# Patient Record
Sex: Female | Born: 1950 | Race: White | Hispanic: No | Marital: Married | Smoking: Never smoker
Health system: Northeastern US, Academic
[De-identification: ages and names within clinical notes are randomized; demographics above are authoritative.]

---

## 2006-06-19 ENCOUNTER — Ambulatory Visit

## 2016-01-17 ENCOUNTER — Ambulatory Visit (HOSPITAL_BASED_OUTPATIENT_CLINIC_OR_DEPARTMENT_OTHER): Admitting: Family Medicine

## 2016-01-17 NOTE — Progress Notes (Signed)
* * *        **[name], [name]**    ------    65 Y old Female, DOB: 09/14/1951    Account Number: [id number]    [address], [place], [place]    Home: [phone]    Guarantor: [name] Insurance: [employer] [facility] [employer] OF  (MEDICAID HMO) Payer  ID: [id number]    PCP: [name] External Visit ID: [id number]    Appointment Facility: [facility]        * * *    01/17/2016    Progress Notes: [name], M.D    ------    ---        **Current Medications**    ---    Taking      * metoprolol 100 mg tablet 1 tab(s) orally 2 times a day     ---    * glyburide 5 mg tablet 1 tab(s) orally once a day     ---    * gabapentin 300 mg capsule 1 cap(s) orally 2 times a day     ---    * atorvastatin 40 mg tablet 1 tab(s) orally once a day (at bedtime)     ---    * Freestyle Freedom Lite Lancets as directed 2x a day     ---    * Freestyle Freedom Lite Test Strips as directed 2x a day     ---    * multivitamin Multiple Vitamins capsule 1 cap(s) orally once a day     ---    * Prilosec OTC 20 mg delayed release tablet 1 tab(s) orally once a day     ---    * metformin 1000 mg tablet 1 tab(s) orally 2 times a day     ---    * aspirin 81 mg delayed release tablet 1 tab(s) orally once a day     ---    * lisinopril 10 mg tablet 1 tab(s) orally once a day     ---    * Medication List reviewed and reconciled with the patient     ---      **Past Medical History**    ---      HTN    ---    high cholesterol-TC-162 HDL-50 LDL-76 TG-182 done 3/87/56    ---    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- [name].    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 2daughter(s) -  healthy.    ---      **Social History**    ---    Smoking  Status: never smoker  , Patient counseled on the dangers of tobacco  use: 01/17/2016.    Seat belt: yes.    no Alcohol.    no Drug use.    Exercise: yes.    Marital Status: married, husband - [name].    Number of household members: 4, husband, daughter, grandson.    Occupation: retired.    no Travel outside [place].    Caffeine: coffee in the am.    Pets: 1 cat, 3 dogs (chihuahuas).    Sexual orientation: heterosexual.    Sexually active: not currently.      **Gyn History**    ---    Periods : no spotting since menopause.    Date of Last Period unsure, somewhere around age 65-64.    Menarche 13.      **OB History**    ---    Total pregnancies 7\.    Total living children 2\.    Miscarriage(s) 5\.      **Allergies**    ---      N.K.D.A.    ---        **Reason for Appointment**    ---      1\. Follow up BP and DM    ---      **History of Present Illness**    ---    _Hypertension_ :    65 year old female presents with c/o HYPERTENSION F/U. c/o MED COMPLIANCE.    Denies : HOME BP CHECKS. MED SIDE EFFECTS. CHEST PAIN. DIZZINESS. LEG EDEMA.  ORTHOPNEA. PAROXYSMAL NOCTURNAL DYSPNEA. PALPITATIONS.    _Diabetes_ :    c/o DIABETES F/U. c/o HOME GLUCOSE MONITORING  130-150s in the AM  . c/o  COMPLIANCE    trying to maintain her diet    .    Denies : POLYURIA. POLYDYPSIA. POLYPHAGIA. WEIGHT GAIN. WEIGHT LOSS. FATIGUE.  PARASTHESIAS.      **Vital Signs**    ---    HR 71, BP  144/72  , Repeat BP 148/78, O2 Sat. 98, Ht 58, Wt 166, Wt Change -2  lb, BMI  34.69  .      **Examination**     ---    _General Examination_ :    General Appearance:  well developed and well-nourished  ,  alert and oriented  .    HEENT:  clear conjunctiva  , no erythema or injection, inner lids pale,  TM's  clear and flat bilaterally  ,  nose clear  , o  ropharynx clear with MMM  ,  pharynx and tonsils normal  .    Neck, Thyroid :  supple, no lymphadenopathy  ,  no thyromegaly  .    Heart:  RRR, normal S1S2  ,  no murmurs  .    Lungs:  clear to auscultation  ,  regular breathing rate and effort  .    Extremities  normal sensation to monofilament testing bilat  .    Skin:  normal, no rash  .    Neurologic Exam:  non-focal exam  .    _Diabetic foot exam_ :    Monofilament test  Normal  .    Pedal Pulses  Normal  .    Skin Integrity  Normal  .    Vibration Sense  Normal  .    Overall  Evaluation Normal  .          **  Assessments**    ---    1\. 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 Hypertension - I10    ---      **Treatment**    ---      **1\. Type 2 diabetes mellitus with diabetic neuropathy, unspecified**    Continue metformin tablet, 1000 mg, 1 tab(s), orally, 2 times a day    Continue glyburide tablet, 5 mg, 1 tab(s), orally, once a day    _LAB: Hemoglobin A1C_ 7.3%    A1C    7.3%        ------------        Notes: HgA1c goal is <7, her HgA1c today was 7.3, which is a nice improvement.  I congratulated her on her progress, work on [employer], and have her return in 3  months.        **2\. Hypertension**    Continue metoprolol tablet, 100 mg, 1 tab(s), orally, 2 times a day    Continue lisinopril tablet, 10 mg, 1 tab(s), orally, once a day    Notes: BP not at goal, but she doesn't want to increase it at this point. She  would consider making a change in the future though if it remains elevated.  Reinforced TLC for the change.      **Procedure Codes**    ---      [id number] BLOOD DRAWING    ---    [address] GLYCATED HEMOGLOBIN TEST    ---      **Follow Up**    ---    3  Months (Reason: f.u with [name]. Dot [name] for DM and BP)    Electronically signed by [name] , MD on 01/17/2016 at 10:46 PM EDT    Sign off status: Completed        * * [facility]    [address]    [place], [place] [address]    Tel: [phone]    Fax: [phone]              * * *          Patient: [name], [name] DOB: 05-11-1951 Progress Note: [name], M.D  01/17/2016    ---    Note generated by eClinicalWorks EMR/PM Software ([email or web address])

## 2016-01-27 ENCOUNTER — Ambulatory Visit

## 2016-01-27 ENCOUNTER — Ambulatory Visit (HOSPITAL_BASED_OUTPATIENT_CLINIC_OR_DEPARTMENT_OTHER): Admitting: Family Medicine

## 2016-01-27 NOTE — Progress Notes (Signed)
* * *        **  [name], [name]**    ------    71 Y old Female, DOB: 06-19-1951    [address], [place], [place] [address]    Home: [phone]    Provider: Alfonso Ramus        * * *    Telephone Encounter    ---    Answered by    Sharyn Dross    Date: 01/27/2016        Time: 02:12 PM    Caller    [name]    ------            Reason    Refills lisinopril            Message                      lisinipril 10 mg            [phone]                Action Taken    [name],[name] 01/27/2016 2:13:12 PM > Last PE 10/14/15.  Appt for f/u scheduled 04/17/16. Lisinopril restarted by FO on 01/17/16.  Anelly Samarin B 01/27/2016 4:57:21 PM > Rx esent            Refills    Refill lisinopril tablet, 10 mg, orally, 90 Tablet, 1 tab(s), once  a day, 90 days, Refills=0    ------          * * *                ---          * * *          Patient: [name], [name] DOB: May 25, 1951 Provider: Peggye Form B  01/27/2016    ---    Note generated by eClinicalWorks EMR/PM Software ([email or web address])

## 2016-01-31 ENCOUNTER — Ambulatory Visit (HOSPITAL_BASED_OUTPATIENT_CLINIC_OR_DEPARTMENT_OTHER): Admitting: Family Medicine

## 2016-01-31 ENCOUNTER — Ambulatory Visit

## 2016-01-31 NOTE — Progress Notes (Signed)
* * *        **  [name], [name]**    ------    21 Y old Female, DOB: 1951/09/19    [address], [place], [place] [address]    Home: [phone]    Provider: Alfonso Ramus        * * *    Telephone Encounter    ---    Answered by    Sharyn Dross    Date: 01/31/2016        Time: 11:07 AM    Caller    [name]- daughter    ------            Reason    Refills metoprolol            Message                      metoprolol 100 mg BID            [phone]                Action Taken    [name],[name] 01/31/2016 11:08:45 AM > Last PE 10/14/15.  Has f/u scheduled 04/17/16. [name] 01/31/2016 11:25:45 PM > Rx esent            Refills    Refill metoprolol tablet, 100 mg, orally, 180, 1 tab(s), 2 times a  day, 90 days, Refills=0    ------          * * *                ---          * * *          Patient: [name], [name] DOB: [birth year] Provider: Peggye Form B  01/31/2016    ---    Note generated by eClinicalWorks EMR/PM Software ([email or web address])

## 2016-01-31 NOTE — Other (Addendum)
[facility] Nurse - [facility] Intake Entered On:  01/31/2016 12:05     Performed On:  01/31/2016 12:01  by Doyne Keel               Vitals/Height/Weight   Peripheral Pulse Rate :   64 bpm   Systolic Blood Pressure :   160 mmHg (HI)    Diastolic Blood Pressure :   76 mmHg   Mean Arterial Pressure :   104 mmHg   Height :   149.86 cm(Converted to: 4 foot 11 in, 59.00 in)    Body Surface Area :   1.7 m2   Weight :   75.1 Kg(Converted to: 165 lb 9 oz)    Body Mass Index :   33.44 Kg/m2   Type of Weight :   Standing   Type of Height :   Stated height   Doyne Keel - 01/31/2016 12:01    [facility] General Information/Subjective   Chief Complaint :   f/u lUNG NODULE    High Risk for Falls :   No   Cancer Fatigue Scale :   3   Cancer Fatigue Score :   3    Preferred Language :   [place]   Name of Interpreter/ID Number :   Patient's daughter is present, she states patient understands most english   Pain Symptoms :   No   Doyne Keel - 01/31/2016 12:01    Patient Preferred Method of Communication   Send Letter   [facility] Infectious Disease Risk Screening   Patient Travel History :   No recent travel   Recent Family Travel History :   No recent travel   Doyne Keel - 01/31/2016 12:01    Allergy   (As Of: 01/31/2016 12:05:38 )   Allergies (Active)   NKA  Estimated Onset Date:   Unspecified ; Created By:   Onnie Boer; Reaction Status:   Active ; Category:   Drug ; Substance:   NKA ; Type:   Allergy ; Updated By:   Roslyn Smiling; Reviewed Date:   01/31/2016 12:03         Medication History   Medication List   (As Of: 01/31/2016 12:05:38 )   Home Meds    lisinopril  :   lisinopril ; Status:   Documented ; Ordered As Mnemonic:   lisinopril 10 mg oral tablet ; Simple Display Line:   10 mg, 1 tab(s), PO, Daily, 0 Refill(s) ; Catalog Code:   lisinopril ; Order Dt/Tm:   01/31/2016 12:04:02          glyBURIDE  :   glyBURIDE ; Status:   Documented ; Ordered As Mnemonic:   glyBURIDE 2.5 mg oral tablet ; Simple Display Line:   2.5 mg, 1  tab(s), PO, Daily, 30 tab(s), 0 Refill(s) ; Catalog Code:   glyBURIDE ; Order Dt/Tm:   08/20/2014 13:34:05          atorvastatin  :   atorvastatin ; Status:   Documented ; Ordered As Mnemonic:   atorvastatin 40 mg oral tablet ; Simple Display Line:   40 mg, 1 tab(s), PO, Daily, 30 tab(s), 0 Refill(s) ; Catalog Code:   atorvastatin ; Order Dt/Tm:   08/20/2014 13:33:49          ibuprofen  :   ibuprofen ; Status:   Documented ; Ordered As Mnemonic:   Advil ; Simple Display Line:   200 mg, PO, q6hr, PRN pain ; Catalog Code:  ibuprofen ; Order Dt/Tm:   12/08/2013 12:59:04          acetaminophen  :   acetaminophen ; Status:   Documented ; Ordered As Mnemonic:   Tylenol ; Simple Display Line:   PO, PRN pain ; Catalog Code:   acetaminophen ; Order Dt/Tm:   12/08/2013 12:58:49          multivitamin  :   multivitamin ; Status:   Documented ; Ordered As Mnemonic:   multivitamin ; Simple Display Line:   1 tab(s), PO, Daily ; Catalog Code:   multivitamin ; Order Dt/Tm:   12/29/2012 09:37:16          omeprazole  :   omeprazole ; Status:   Documented ; Ordered As Mnemonic:   Prilosec OTC 20 mg oral enteric coated tablet ; Simple Display Line:   20 mg, 1 tab(s), PO, Daily ; Catalog Code:   omeprazole ; Order Dt/Tm:   12/29/2012 09:37:29          simvastatin  :   simvastatin ; Status:   Documented ; Ordered As Mnemonic:   Simvastatin ; Simple Display Line:   80 mg, PO, HS ; Catalog Code:   simvastatin ; Order Dt/Tm:   12/29/2012 09:37:03          metformin  :   metformin ; Status:   Documented ; Ordered As Mnemonic:   metformin ; Simple Display Line:   1,000 mg, PO, BID ; Catalog Code:   metFORMIN ; Order Dt/Tm:   02/23/2009 09:39:53          metoprolol  :   metoprolol ; Status:   Documented ; Ordered As Mnemonic:   metoprolol ; Simple Display Line:   100 mg, PO, BID ; Catalog Code:   metoprolol ; Order Dt/Tm:   02/23/2009 09:39:23            [facility] Social History   Cigarette Smoking Last 365 Days :   No   Exposure to Tobacco Smoke :    Never smoker   Tobacco products in past 30 days? :   No   Doyne Keel - 01/31/2016 12:01    Social History   (As Of: 01/31/2016 12:05:38 )   Tobacco:  Denies Tobacco Use       Comments:  01/31/2016 12:04 - Doyne Keel: NEver Smoker   (Last Updated: 01/31/2016 12:04:52  by Doyne Keel)          Alcohol:  Denies Alcohol Use      (Last Updated: 12/29/2012 09:40:27  by Fulton Reek )         Employment/School:        Retired, Work/School description: Housekeeper.   (Last Updated: 12/08/2013 13:00:10  by Lacretia Nicks RN, Frazier Butt)          Exercise:        Exercise duration: 20.  Exercise type: Walking.   (Last Updated: 12/08/2013 13:00:36  by Lacretia Nicks RN, Frazier Butt)          Nutrition/Health:        Type of diet: 3 meals/day.  Regular, Caffeine intake amount: 2 cups/day.   (Last Updated: 12/08/2013 13:01:15  by Lacretia Nicks RN, Frazier Butt)          Substance Abuse:  Denies Substance Abuse      (Last Updated: 12/29/2012 09:40:30  by Fulton Reek )         Home/Environment:        Lives with Children,  Spouse, 2 grandchildren.   (Last Updated: 12/08/2013 13:01:34  by Karl Luke)            Advance Directive   Advanced Directives :   No   Doyne Keel - 01/31/2016 12:01    [facility] ONC Nutrition   Weight Change > 10lbs in 6 Months :   No   Home Diet :   Regular   Appetite :   Good   Feeding Ability :   Complete independence   Doyne Keel - 01/31/2016 12:01    CCA Encounter   Onc Type of Patient :   Outpatient Visit Existing   Onc Visit Level, Existing :   Level 1   (Comment: 5 minutes spent face to face with patient and daughter Doyne Keel - 01/31/2016 12:01 ] )   Doyne Keel - 01/31/2016 12:01

## 2016-01-31 NOTE — Other (Signed)
cc:  Peggye Form M.D.    __________________________________________________________________________    ONCOLOGY CLINIC NOTE  DATE:  01/31/2016    The patient now returns in regards to her lung nodule.  This has previously been  biopsied where it looked consistent with an infectious process.  She now returns  with no new constitutional symptoms.  She is on no new medications.  We reviewed  her chest CT that shows a stable lung nodule with no evidence of progression.  I  still feel as though this likely represents a benign process but we will plan  another CT scan to confirm its stability.  All questions were answered during  our 15-minute visit.    Dictated by:  Alvester Chou, M.D.    D:  01/31/2016 12:45:58  T:  02/01/2016 08:18:20  E:  02/01/2016 08:18:20  CRM/mes  Job# [id number]   SIGNATURE LINE    Electronically signed by Lattie Corns MD, Alcario Drought on 02/07/2016 at 07:33:57 EST

## 2016-02-22 ENCOUNTER — Ambulatory Visit (HOSPITAL_BASED_OUTPATIENT_CLINIC_OR_DEPARTMENT_OTHER): Admitting: Family Medicine

## 2016-02-22 NOTE — Progress Notes (Signed)
* * *        **  [name], [name]**    ------    66 Y old Female, DOB: [birth year]    [address] [place], [place] [address]    Home: [phone]    Provider: Alfonso Ramus        * * *    Telephone Encounter    ---    Answered by    Sharyn Dross    Date: 02/22/2016        Time: 04:18 PM    Caller    [name]- daughter    ------            Reason    Refills gabapentin            Message                      gabapentin 300 mg BID            [phone]                Action Taken    [name],[name] 02/22/2016 4:19:02 PM > Pt had PE  10/14/15. Has appt for follow up 04/17/16. Jeno Calleros B 02/22/2016 4:56:45  PM > Rx esent            Refills    Refill gabapentin capsule, 300 mg, orally, 60, 1 cap(s), 2 times a  day, 30 days, Refills=3    ------          * * *                ---          * * *          Patient: [name], [name] DOB: 25-Nov-1950 Provider: Peggye Form B  02/22/2016    ---    Note generated by eClinicalWorks EMR/PM Software ([email or web address])

## 2016-03-05 ENCOUNTER — Ambulatory Visit (HOSPITAL_BASED_OUTPATIENT_CLINIC_OR_DEPARTMENT_OTHER): Admitting: Family Medicine

## 2016-03-05 NOTE — Progress Notes (Signed)
* * *        **  JAMYIA, FORTUNE**    ------    74 Y old Female, DOB: 08-30-1951    [address] [place], [place] [address]    Home: [phone]    Provider: Lovina Reach        * * *    Telephone Encounter    ---    Answered by    Ivy Lynn    Date: 03/05/2016        Time: 01:10 PM    Caller    dtr. Lisa left VM    ------            Reason    refill            Message                      requesting refill of glyburide 5mg , last A1C 10/14/15 at CPE appt, recent FU appt, no pending appts                Action Taken    Flossie Wexler E 03/05/2016 5:07:30 PM > rx sent            Refills    Refill glyburide tablet, 5 mg, orally, 30, 1 tab(s), once a day,  30 days, Refills=11    ------          * * *                ---          * * *          Patient: Milliman, Hali L DOB: 06-11-1951 Provider: Thelma Barge E  03/05/2016    ---    Note generated by eClinicalWorks EMR/PM Software ([email or web address])

## 2016-03-12 ENCOUNTER — Ambulatory Visit (HOSPITAL_BASED_OUTPATIENT_CLINIC_OR_DEPARTMENT_OTHER): Admitting: Family Medicine

## 2016-03-12 NOTE — Progress Notes (Signed)
* * *        **  [name], [name]**    ------    82 Y old Female, DOB: 03/07/1951    [address] [place], [place] [address]    Home: [phone]    Provider: Lovina Reach        * * *    Telephone Encounter    ---    Answered by    Ivy Lynn    Date: 03/12/2016        Time: 12:40 PM    Caller    dtr. left VM    ------            Reason    refill            Message                      requesting refill of metformin, last A1C 10/14/15, CPE/FU appts current, no pending appts                Action Taken    Maninder Deboer E 03/12/2016 2:53:05 PM > rx sent            Refills    Refill metformin tablet, 1000 mg, orally, 60, 1 tab(s), 2 times a  day, 30 days, Refills=11    ------          * * *                ---          * * *          Patient: [name], [name] DOB: 10-06-1950 Provider: Lovina Reach  03/12/2016    ---    Note generated by eClinicalWorks EMR/PM Software ([email or web address])

## 2016-03-27 ENCOUNTER — Ambulatory Visit (HOSPITAL_BASED_OUTPATIENT_CLINIC_OR_DEPARTMENT_OTHER): Admitting: Family Medicine

## 2016-03-27 ENCOUNTER — Ambulatory Visit: Admitting: Family Medicine

## 2016-03-27 NOTE — Progress Notes (Signed)
* * *        **[name], [name]**    ------    31 Y old Female, DOB: 11-Aug-1951    Account Number: [id number]    [address] [place], [place]    Home: [phone]    Guarantor: Ronney Asters Insurance: [facility] [place] (MEDICAID HMO) Payer  ID: [id number]    External Visit ID: [id number]    Appointment Facility: [facility]        * * *    03/27/2016    Progress Note: [name]    ------    ---        **Current Medications**    ---    Taking      * atorvastatin 40 mg tablet 1 tab(s) orally once a day (at bedtime)     ---    * Freestyle Freedom Lite Lancets as directed 2x a day     ---    * Freestyle Freedom Lite Test Strips as directed 2x a day     ---    * multivitamin Multiple Vitamins capsule 1 cap(s) orally once a day     ---    * Prilosec OTC 20 mg delayed release tablet 1 tab(s) orally once a day     ---    * aspirin 81 mg delayed release tablet 1 tab(s) orally once a day     ---    * lisinopril 10 mg tablet 1 tab(s) orally once a day     ---    * metoprolol 100 mg tablet 1 tab(s) orally 2 times a day     ---    * gabapentin 300 mg capsule 1 cap(s) orally 2 times a day     ---    * glyburide 5 mg tablet 1 tab(s) orally once a day     ---    * metformin 1000 mg tablet 1 tab(s) orally 2 times a day     ---    * Medication List reviewed and reconciled with the patient     ---      **Past Medical History**    ---      HTN    ---    high cholesterol-TC-162 HDL-50 LDL-76 TG-182 done 2/95/28    ---    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- Dr Pearson Grippe.    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 2daughter(s) - healthy.    ---      **  Social History**    ---    Smoking  Status: never smoker  , Patient counseled on the dangers of tobacco  use: 03/27/2016.    Seat belt: yes.    no Alcohol.    no Drug use.    Exercise: yes.    Marital Status: married, husband - Antonio.    Number of household members: 4, husband, daughter, grandson.    Occupation: retired.    no Travel outside [place].    Caffeine: coffee in the am.    Pets: 1 cat, 3 dogs (chihuahuas).    Sexual orientation: heterosexual.    Sexually active: not currently.      **Gyn History**    ---    Periods : no spotting since menopause.    Date of Last Period unsure, somewhere around age [age].    Menarche 13.      **OB History**    ---    Total pregnancies 7\.    Total living children 2\.    Miscarriage(s) 5\.      **Allergies**    ---      N.K.D.A.    ---      **Review of Systems**    ---    _CONSTITUTIONAL_ :    no Fever. no Chills. no Sweats. c/o Fatigue.    _OPTHALMOLOGY_ :    no Change in Vision.    _ENT_ :    Nasal congestion yes. c/o Rhinorrhea. Sore Throat yes. Cough yes. c/o Otalgia.    _RESPIRATORY_ :    no Shortness of Breath. no DOE (dyspnea on exertion).    _CARDIOLOGY_ :    no Chest Pain. no Shortness of Breath. no Palpitations. no Dizziness. no Leg  Edema.    _GASTROENTEROLOGY_ :    no Abdominal Pain. no Nausea. no Vomiting.    _NEUROLOGY_ :    no Headaches.    _DERMATOLOGY_ :    no Rash.            **Reason for Appointment**    ---      1\. Cough, congestion, sore throat    ---      **History of Present Illness**    ---    _Sick Visit_ :    Patient is a 65 year old female presents with cough and  congestion.It began 2  days ago with sorethroat. Yesterday developed cough with scant mucus.  Associated with Post nasal drip Need to clear her throat all the time, fatigue  and body aches. Feels clogged on both ear. No fever and chills. Loss of  appetite. Tried Advil this morning.      **Vital Signs**    ---    HR 60, BP 130/58, Temp 98.4, O2 Sat. 98, Ht 58, Wt 164, Wt Change -2 lb, BMI  34.27  .      **Examination**    ---    _General Examination_ :    General Appearance:  alert and oriented  ,  NAD  .    HEENT:  moist mucous membranes  ,  PERRLA  ,  EOMI  .    Oral cavity:  Mild pharyngeal erythema. No exudates.  .    Lymph nodes  none  .    Heart:  RRR, normal S1S2  ,  no murmurs  .    Lungs:  clear to auscultation bilaterally  ,  no wheezes/rhonchi/rales  .    Extremities  no edema  .    Skin:  no  rash or skin lesions  .          **Assessments**    ---    1\. Upper respiratory infection - J06.9 (Primary)    ---    2\. Pharyngitis - J02.9    ---      **Treatment**    ---      **1\. Upper respiratory infection**    Notes: likely viral cause. Discussed the diagnosis. Self limiting which can  take up to 2 weeks to run its course. Supprotive and symptomatic treatment.  Mucinex DM, Sudafed. Tylenol or Ibuprofen.    ---        **2\. Pharyngitis**    _LAB: Throat Culture_   C Throat    Laboratory, [facility], [address], [place], [place], [address]      \-    ------------        Notes: Likley viral. Supportive care.      **Follow Up**    ---    Return with worsening symptoms.    Electronically signed by Peggye Form on 03/30/2016 at 10:14 PM EDT    Sign off status: Completed        * * [facility]    [address]    [place], [place] [address]    Tel: [phone]    Fax: [phone]              * * *          Patient: [name], [name] DOB: [birth year] Progress Note: [name]  03/27/2016    ---    Note generated by eClinicalWorks EMR/PM Software ([email or web address])

## 2016-03-30 ENCOUNTER — Ambulatory Visit (HOSPITAL_BASED_OUTPATIENT_CLINIC_OR_DEPARTMENT_OTHER): Admitting: Family Medicine

## 2016-03-30 LAB — HX THROAT CULTURE
CASE NUMBER: 2017178002877
HX F: NORMAL
HX P: NORMAL

## 2016-03-30 NOTE — Progress Notes (Signed)
* * *        **  [name], [name]**    ------    48 Y old Female, DOB: 1950-10-05    [address] [place], [place] [address]    Home: [phone]    Provider: Alfonso Ramus        * * *    Telephone Encounter    ---    Answered by    Rubbie Battiest    Date: 03/30/2016        Time: 01:54 PM    Caller    [name] daughter    ------            Reason    TC results            Message                      Her throat is not better, voice is hoarse, trouble swallowing. She is using Mucinex, tylenol, throat spray.       [phone]                Action Taken    Rubbie Battiest 03/30/2016 1:55:18 PM > TC still pending,  normal so far. Discussed symptomatic relief. Lennis Rader B 03/30/2016 2:12:27  PM > Noted and agree. Like Young,[name] 03/30/2016 4:38:12 PM > Daughter  called back, now with redness at back of throat. Discussed it could be viral,  discussed again sx tx. Thought maybe too soon for TC to be done, coming in  tomorrow for re-eval. Cherly Erno B 03/30/2016 7:40:38 PM > Noted                * * *                ---          * * *          Patient: [name], [name] DOB: [birth year] Provider: Peggye Form B  03/30/2016    ---    Note generated by eClinicalWorks EMR/PM Software ([email or web address])

## 2016-03-31 ENCOUNTER — Ambulatory Visit (HOSPITAL_BASED_OUTPATIENT_CLINIC_OR_DEPARTMENT_OTHER): Admitting: Family Medicine

## 2016-03-31 NOTE — Progress Notes (Signed)
* * *        **[name], [name]**    ------    45 Y old Female, DOB: 1950/10/18    Account Number: [id number]    [address], [place], [place]    Home: [phone]    Guarantor: Ronney Asters Insurance: [facility] [place] (MEDICAID HMO) Payer  ID: 0    PCP: Alfonso Ramus External Visit ID: [id number]    Appointment Facility: [facility]        * * *    03/31/2016    Progress Notes: Thelma Barge, M.D    ------    ---        **Current Medications**    ---    Taking      * atorvastatin 40 mg tablet 1 tab(s) orally once a day (at bedtime)     ---    * Freestyle Freedom Lite Lancets as directed 2x a day     ---    * Freestyle Freedom Lite Test Strips as directed 2x a day     ---    * multivitamin Multiple Vitamins capsule 1 cap(s) orally once a day     ---    * Prilosec OTC 20 mg delayed release tablet 1 tab(s) orally once a day     ---    * aspirin 81 mg delayed release tablet 1 tab(s) orally once a day     ---    * lisinopril 10 mg tablet 1 tab(s) orally once a day     ---    * metoprolol 100 mg tablet 1 tab(s) orally 2 times a day     ---    * gabapentin 300 mg capsule 1 cap(s) orally 2 times a day     ---    * glyburide 5 mg tablet 1 tab(s) orally once a day     ---    * metformin 1000 mg tablet 1 tab(s) orally 2 times a day     ---    * Medication List reviewed and reconciled with the patient     ---      **Past Medical History**    ---      HTN    ---    high cholesterol-TC-162 HDL-50 LDL-76 TG-182 done 11/13/06    ---    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- Dr Pearson Grippe.    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    ---    Cardiac stress test-03/01/15-positive EKG changes but neg for inducible wall  motion abnormalities    ---    Lung nodule- following Dr Lattie Corns seen 01/2015    ---      **Surgical History**     ---      ectopic pregnancies    ---    left ankle screws 2010    ---      **Family History**    ---      Father: deceased 92 yrs, MI, diagnosed with Hypertension, Heart Disease    ---    Mother: deceased 19 yrs, DMII, cirrhoisis of the liver, diagnosed with  Diabetes    ---    Sibling 1: sister ovarian/uterus CA, diagnosed with Diabetes,  Hypertension    ---    Sibling 2: brother - DMII, HTN, diagnosed with Diabetes, Hypertension    ---    4 brother(s) , 2 sister(s) . 2daughter(s) - healthy.    ---      **Social History**    ---    Smoking  Status: never smoker  , Patient counseled on the dangers of tobacco  use: 03/27/2016.    Seat belt: yes.    no Alcohol.    no Drug use.    Exercise: yes.    Marital Status: married, husband - Antonio.    Number of household members: 4, husband, daughter, grandson.    Occupation: retired.    no Travel outside [place].    Caffeine: coffee in the am.    Pets: 1 cat, 3 dogs (chihuahuas).    Sexual orientation: heterosexual.    Sexually active: not currently.      **Gyn History**    ---    Periods : no spotting since menopause.    Date of Last Period unsure, somewhere around age [age].    Menarche 13.      **OB History**    ---    Total pregnancies 7\.    Total living children 2\.    Miscarriage(s) 5\.        **Reason for Appointment**    ---      1\. Still with sore throat, now redness in the back, difficulty swallowing    ---    2\. c/o L ear/both side facial pain, no fever, no chest, no sob. c/o cough,  blood tinge sputum    ---      **History of Present Illness**    ---    _ENT_ :    65 year old female presents with c/o EAR FULLNESS. c/o FACIAL PAIN  frontal  .  c/o NASAL CONGESTION. c/o RHINITIS. c/o SORE THROAT for 1 week  getting worse  . c/o COUGH  productive of yellow sputum and some blood tinged  .    Denies : HEADACHE. EAR PAIN. TEETH PAIN. SWOLLEN LYMPH NODES. SICK CONTACTS.  FEVER  Just hot and cold and sweating  .      **Vital Signs**    ---    HR 72, BP  148/70   , Temp 98.7, Ht 58, Wt 163.8, Wt Change -.2 lb, BMI  34.23  .      **Examination**    ---    _ENT_ :    General Appearance :  mildly ill appearing female in NAD  .    Eyes:  non-injected, without edema  .    Ears:  TMs retracted with air fluid levels  .    Nose :  congested  ,  purulent nasal discharge  .    Sinuses :  tender ethmoidal sinuses  ,  bilaterally  .    Mouth:  moist mucus membranes  .    Throat:  erythema but no exudate  .    Neck :  supple, non-tender, no lymphadenopathy  .    Heart :  RRR, normal S1 S2, no murmurs  .    Lungs :  clear to auscultation bilaterally  .          **Assessments**    ---    1\. Acute sinus infection - J01.90 (Primary)    ---      **Treatment**    ---      **1\. Acute sinus infection**  Start amoxicillin capsule, 500 mg, 1 cap(s), orally, 3 times a day, 10 day(s),  30, Refills 0    Notes: I think she has a sinus infection with PND causing a lot of her  symptoms. We will tx for her sinus infection.    ---        **Labs**    ------    _Lab: Rapid Strep_ NEG    ---        Duong,Sokha 03/31/2016 9:30:55 AM >    ------      **Procedure Codes**    ---      [address] RAPID STREP GROUP A    ---      **Follow Up**    ---    prn    Electronically signed by Thelma Barge , MD on 03/31/2016 at 09:32 AM EDT    Sign off status: Completed        * * [facility]    [address]    [place], [place] [address]    Tel: [phone]    Fax: [phone]              * * *          Patient: [name], [name] DOB: [birth year] Progress Note: Thelma Barge, M.D  03/31/2016    ---    Note generated by eClinicalWorks EMR/PM Software ([email or web address])

## 2016-04-27 ENCOUNTER — Ambulatory Visit: Admitting: Family Medicine

## 2016-04-27 ENCOUNTER — Ambulatory Visit (HOSPITAL_BASED_OUTPATIENT_CLINIC_OR_DEPARTMENT_OTHER): Admitting: Family Medicine

## 2016-04-27 LAB — HX COMPREHENSIVE METABOLIC PANEL
CASE NUMBER: 2017209001357
HX ALBUMIN LVL: 3.9 g/dL — NL (ref 3.5–5.0)
HX ALKALINE PHOSPHATASE: 103 U/L — NL (ref 45.0–117.0)
HX ALT: 36 U/L — NL (ref 6.0–78.0)
HX ANION GAP: 8 — NL (ref 3.0–11.0)
HX AST: 18 U/L — NL (ref 6.0–40.0)
HX BILIRUBIN TOTAL: 0.3 mg/dL — NL (ref 0.2–1.0)
HX BUN: 20 mg/dL — ABNORMAL HIGH (ref 7.0–18.0)
HX CALCIUM LVL: 9.3 mg/dL — NL (ref 8.5–10.1)
HX CHLORIDE: 109 mmol/L — ABNORMAL HIGH (ref 98.0–107.0)
HX CO2: 25 mmol/L — NL (ref 21.0–32.0)
HX CREATININE: 1.07 mg/dL — NL (ref 0.55–1.3)
HX GLUCOSE LVL: 135 mg/dL — ABNORMAL HIGH (ref 65.0–110.0)
HX POTASSIUM LVL: 5 mmol/L — NL (ref 3.6–5.2)
HX SODIUM LVL: 142 mmol/L — NL (ref 136.0–145.0)
HX TOTAL PROTEIN: 6.9 g/dL — NL (ref 6.0–8.0)

## 2016-04-27 LAB — HX GLOMERULAR FILTRATION RATE (ESTIMATED)
CASE NUMBER: 2017209001357
HX AFN AMER GLOMERULAR FILTRATION RATE: 63 mL/min/{1.73_m2}
HX NON-AFN AMER GLOMERULAR FILTRATION RATE: 55 mL/min/{1.73_m2}

## 2016-04-27 LAB — HX LIPID PANEL
CASE NUMBER: 2017209001357
HX CHOL: 171 mg/dL — NL (ref 140.0–200.0)
HX HDL: 46 mg/dL — NL (ref 41.0–60.0)
HX LDL: 68 mg/dL — NL (ref 0.0–129.0)
HX TRIG: 286 mg/dL — ABNORMAL HIGH (ref 0.0–149.0)

## 2016-04-27 LAB — HX MICROALBUMIN LEVEL URINE
CASE NUMBER: 2017209001358
HX MICROALBUMIN/CREAT RATIO: 14.4 mg_alb/Gm_crea — NL (ref 0.0–29.0)
HX UR CREAT (MICROALBUMIN): 490 mg/dL
HX UR MICROALBUMIN RANDOM: 70.5 mg/L

## 2016-04-27 NOTE — Progress Notes (Signed)
* * *        **[name], [name]**    ------    72 Y old Female, DOB: [birth year]    Account Number: [id number]    [address] [place], [place]    Home: [phone]    Guarantor: Ronney Asters Insurance: [facility] [place] (MEDICAID HMO) Payer  ID: 0    External Visit ID: [id number]    Appointment Facility: [facility]        * * *    04/27/2016    Progress notes: [name]    ------    ---        **Current Medications**    ---    Taking      * atorvastatin 40 mg tablet 1 tab(s) orally once a day (at bedtime)     ---    * Freestyle Freedom Lite Lancets as directed 2x a day     ---    * Freestyle Freedom Lite Test Strips as directed 2x a day     ---    * multivitamin Multiple Vitamins capsule 1 cap(s) orally once a day     ---    * Prilosec OTC 20 mg delayed release tablet 1 tab(s) orally once a day     ---    * aspirin 81 mg delayed release tablet 1 tab(s) orally once a day     ---    * lisinopril 10 mg tablet 1 tab(s) orally once a day     ---    * metoprolol 100 mg tablet 1 tab(s) orally 2 times a day     ---    * gabapentin 300 mg capsule 1 cap(s) orally 2 times a day     ---    * glyburide 5 mg tablet 1 tab(s) orally once a day     ---    * metformin 1000 mg tablet 1 tab(s) orally 2 times a day     ---    Discontinued    * amoxicillin 500 mg capsule 1 cap(s) orally 3 times a day     ---    * Medication List reviewed and reconciled with the patient     ---      **Past Medical History**    ---      HTN    ---    high cholesterol-TC-162 HDL-50 LDL-76 TG-182 done 11/13/06    ---    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- Dr Pearson Grippe.    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 2daughter(s) - healthy.    ---      **Social History**    ---    Smoking  Status: never smoker  , Patient counseled on the dangers of tobacco  use: 04/27/2016.    Seat belt: yes.    no Alcohol.    no Drug use.    Exercise: yes.    Marital Status: married, husband - Antonio.    Number of household members: 4, husband, daughter, grandson.    Occupation: retired.    no Travel outside [place].    Caffeine: coffee in the am.    Pets: 1 cat, 3 dogs (chihuahuas).    Sexual orientation: heterosexual.    Sexually active: not currently.      **Gyn History**    ---    Periods : no spotting since menopause.    Date of Last Period unsure, somewhere around age [age].    Menarche 13.      **OB History**    ---    Total pregnancies 7\.    Total living children 2\.    Miscarriage(s) 5\.      **Allergies**    ---      N.K.D.A.    ---      **Review of Systems**    ---    _CONSTITUTIONAL_ :    no Fever. no Chills. no Sweats. c/o Fatigue.    _OPTHALMOLOGY_ :    no Change in Vision.    _ENT_ :    no Nasal congestion. no Rhinorrhea. no Sore Throat. no Cough. no Otalgia.    _RESPIRATORY_ :    no Shortness of Breath. no DOE (dyspnea on exertion).    _CARDIOLOGY_ :    no Chest Pain. no Shortness of Breath. no Palpitations. no Dizziness. no Leg  Edema.    _GASTROENTEROLOGY_ :    no Abdominal Pain. no Nausea. no Vomiting.    _NEUROLOGY_ :    no Headaches.    _PSYCHOLOGY_ :    c/o Depression. c/o Stressors.    _DERMATOLOGY_ :    no Rash.            **Reason for Appointment**    ---      1\. F/u dm - bp    ---       **History of Present Illness**    ---    _PHQ Form_ :    PHQ9 Form Little interest or pleasure in doing things Nearly every day,  Feeling down, depressed, hopeless Nearly every day, Trouble falling or staying  asleep, or sleeping to much More than half the days, Feeling tired or little  energy Several days, Poor appetite or overeating Several days, Feeling bad  about yourself-or that you are a failure or let yourself or your family down  Not at all, Trouble concentrating on things such as reading the newspaper or  watching television Not at all, Moving or speaking so slowly that other people  could have noticed. or the opposite - being so fidgety or restless that you  have been moving around a lot more than usual Not at all, Thoughts that you  were better off dead, or of hurting yourself in someway? Not at all, Total  score 10, Interpretation Moderate Depression.    _Sick Visit_ :    65 year old female presents for f/u of diabetes. Here with daughter who serves  as Equities trader. Also  report of left knee pain it is achy , sharp with certain  movement. Ongoing for 7 years . No locking or feeling of giving. Worse with  going up and down the stairs. Had xray in the past and was told degenerative  disease. No swelling.    Patient is in a lot of stress at home. She is teary.Not going well with  husband for a while. Does not want to go out.    Treated with antibiotic for sinus infection 4 weeks ago. Still with cough,  post nasal drip. No wheezing. No fever, No shortness of breath.    _Diabetes_ :    DIABETES F/U  at patient's baseline  . HOME GLUCOSE MONITORING  AM fasting:  130's-170's mostly 130-140's  . HYPOGLYCEMIC EPISODES  none  . COMPLIANCE  with medications  . HGBA1C  7.3%  . LDL  68  . MICROALBUMINURIA  elevated  .  LAST EYE EXAM Date Will get report. FLU SHOT  received  . PNEUMOVAX  received  . POLYURIA  none  . POLYDYPSIA  none  . POLYPHAGIA  none  .      **Vital Signs**    ---    HR 58, BP 132/70, O2 Sat. 98,  Ht 58, Wt 166, Wt Change 2.2 lb, BMI  34.69  .      **Examination**    ---    _General Examination_ :    General Appearance:  alert and oriented  ,  NAD  .    HEENT:  moist mucous membranes  ,  PERRLA  ,  EOMI  .    Lymph nodes  none  .    Heart:  RRR, normal S1S2  ,  no murmurs  .    Lungs:  clear to auscultation bilaterally  ,  no wheezes/rhonchi/rales  .    Extremities  no edema  .    Skin:  no rash or skin lesions  .          **Assessments**    ---    1\. Type 2 diabetes mellitus - E11.9 (Primary)    ---    2\. Hypertension - I10    ---    3\. Cough - R05    ---    4\. Adjustment disorder with depressed mood - F43.21    ---    5\. Knee pain, left - M25.562    ---      **Treatment**    ---      **1\. Type 2 diabetes mellitus**    Continue atorvastatin tablet, 40 mg, 1 tab(s), orally, once a day (at bedtime)    Continue glyburide tablet, 5 mg, 1 tab(s), orally, once a day    Continue metformin tablet, 1000 mg, 1 tab(s), orally, 2 times a day    _LAB: Glycated Hemoglobin [facility]_ 7.5%    Glycated Hgb    7.5    H    <=5.6 -  %    ------------    Mean Bld Glucos    169      \- mg/dL    ------------      Jazz Biddy B 05/04/2016 10:52:54 AM > Slightly above goal. Letter mailed    ------    _LAB: Lipid Panel_ TC-171 HDL-46 LDL-68 TG-286  Chol    171      140-200 -  mg/dL    ------------    HDL    46      41-60 - mg/dL    ------------  LDL    68      0-129 - mg/dL    ------------    Status    Non Fasting      \-    ------------    Trig    286    H    0-149 - mg/dL    ------------      Earsel Shouse B 05/04/2016 10:54:29 AM > letter mailed    ------    _LAB: Microalbumin Level Urine_ High  Microalb/Creat    14.4      0.0-29.0  - mg/Gm_crea    ------------    Ur Creat Microalb    490.0      \- mg/dL    ------------    Ur Microalb Random    70.5      \- mg/L    ------------      Aaryn Sermon B 05/04/2016 10:54:53 AM >    ------        Notes: Continue current  medication. Hemoglobin A1C not at goal but not bad.  Continue current medication. Discussed diabetic diet and regular exercise.  Hadeye exam recently- [facility] will get report.        **2\. Hypertension**    Continue lisinopril tablet, 10 mg, 1 tab(s), orally, once a day, 30 days, 30,  Refills 3    _LAB: Comprehensive Metabolic Panel - [facility]_ BUN- 20; Cl-109; Glu-135    Albumin Lvl    3.9      3.5-5.0 - Gm/dL    ------------    Alk Phos    103      45-117 - Units/L    ------------    ALT    36      6-78 - Units/L    ------------    AST    18      6-40 - Units/L    ------------    Bili Total    0.3      0.2-1.0 - mg/dL    ------------    BUN    20    H    7-18 - mg/dL    ------------    Calcium Lvl    9.3      8.5-10.1 - mg/dL    ------------    Chloride    109    H    98-107 - mmol/L    ------------    CO2    25      21-32 - mmol/L    ------------    Creatinine    1.070      0.550-1.300 - mg/dL    ------------    Glucose Lvl    135    H    65-110 - mg/dL    ------------    Potassium Lvl    5.0      3.6-5.2 - mmol/L    ------------    Sodium Lvl    142      136-145 - mmol/L    ------------    Total Protein    6.9      6.0-8.0 - Gm/dL    ------------    Anion Gap    8      3-11 -    ------------      Sharyn Dross 04/27/2016 5:06:57 PM > Chelesea Weiand B 05/04/2016  10:53:36 AM >    ------        Notes: Controlled. Continue current medication. DASH diet.        **3\. Cough**  Notes: Post infectious cough. I advised Mucinex try Flonase.        **4\. Adjustment disorder with depressed mood**    Start Citalopram Hydrobromide tablet, 10 mg, 1 tab(s), orally, once a day, 30  day(s), 30, Refills 1    Notes: PHQ9 score is 10 which means moderate depression. Wants medication.  Will try Citalopram. Discussed the side effect such as suicidal thoughts.        **5\. Knee pain, left**    Notes: Lkely arthritis. I advised heat, Ibuprofen  or Tylenol as needed.  Strengthening exercises. If not better or get worse. I will recommend physical  therapy.      **Follow Up**    ---    4 Weeks (Reason: depression)    Electronically signed by Peggye Form on 05/06/2016 at 05:03 PM EDT    Sign off status: Completed        * * [facility]    [address]    [place], [place] [address]    Tel: [phone]    Fax: [phone]              * * *          Patient: [name], [name] DOB: 07/15/1951 Progress Note: Faraaz Wolin  04/27/2016    ---    Note generated by eClinicalWorks EMR/PM Software ([email or web address])

## 2016-04-28 LAB — HX HEMOGLOBIN A1C
CASE NUMBER: 2017209001357
HX EST AVERAGE GLUCOSE (EAG): 169 mg/dL
HX HEMOGLOBIN A1C: 7.5 % — ABNORMAL HIGH
HX LA1C (INTERNAL): 2.5 % — NL
HX P3 PEAK (INTERNAL): 4.3 % — NL
HX P4 PEAK (INTERNAL): 1.6 % — NL
HX TOTAL AREA RANGE (INTERNAL): 2.62 microvolt/sec — NL (ref 1.0–3.5)

## 2016-05-04 ENCOUNTER — Ambulatory Visit (HOSPITAL_BASED_OUTPATIENT_CLINIC_OR_DEPARTMENT_OTHER): Admitting: Family Medicine

## 2016-05-04 NOTE — Progress Notes (Signed)
* * *        **  [name]**    ------    27 Y old Female, DOB: 06-22-1951    [address] [place], [place] [address]    Home: [phone]    Provider: Alfonso Ramus        * * *    Telephone Encounter    ---    Answered by    Sharyn Dross    Date: 05/04/2016        Time: 02:13 PM    Caller    [name]- daughter    ------            Reason    Refills metoprolol            Message                      metoprolol BID            [phone]                Action Taken    [name],[name] 05/04/2016 3:08:45 PM > Last PE 10/14/15.  Has f/u scheduled 06/01/16. [name] 05/04/2016 10:45:05 PM > Rx esent            Refills    Refill metoprolol tablet, 100 mg, orally, 180, 1 tab(s), 2 times a  day, 90 days, Refills=0    ------          * * *                ---          * * *          Patient: [name], [name] DOB: 1951-05-15 Provider: Peggye Form B  05/04/2016    ---    Note generated by eClinicalWorks EMR/PM Software ([email or web address])

## 2016-05-06 ENCOUNTER — Ambulatory Visit (HOSPITAL_BASED_OUTPATIENT_CLINIC_OR_DEPARTMENT_OTHER): Admitting: Family Medicine

## 2016-05-06 NOTE — Progress Notes (Signed)
* * *        **  Angus Palms L**    ------    30 Y old Female, DOB: [birth year]    [address], [place], [place] [address]    Home: [phone]    Provider: Alfonso Ramus        * * *    Telephone Encounter    ---    Answered by    Peggye Form B    Date: 05/06/2016        Time: 04:44 PM    Reason    eye exam    ------            Action Taken    Ricci Paff B 05/06/2016 4:44:50 PM > Pls get report from  [facility] [name],[name] 05/09/2016 11:24:23 AM > Caitlin at [facility] will  fax report. Pt had exam 01/31/2016. [name],[name] 05/09/2016 1:06:31 PM >  Report on your desk with other TE-asociated documents. Charmaine Placido B  05/09/2016 4:24:07 PM > Reviewed                * * *                ---          * * *          Patient: Slavens, Cadyn L DOB: Feb 20, 1951 Provider: Peggye Form B  05/06/2016    ---    Note generated by eClinicalWorks EMR/PM Software ([email or web address])

## 2016-05-09 ENCOUNTER — Ambulatory Visit

## 2016-05-25 ENCOUNTER — Ambulatory Visit (HOSPITAL_BASED_OUTPATIENT_CLINIC_OR_DEPARTMENT_OTHER)

## 2016-05-25 NOTE — Progress Notes (Signed)
* * *        **  [name]**    ------    7 Y old Female, DOB: 1950-12-11    [address], [place], [place] [address]    Home: [phone]    Provider: Cora Daniels        * * *    Telephone Encounter    ---    Answered by    Ivy Lynn    Date: 05/25/2016        Time: 02:16 PM    Caller    dtr. Lisa left VM    ------            Reason    refill            Message                      requesting refill of atorvastatin 40mg .  CPE current, has FU appt pending for 06/01/16                Action Taken    Prugh,Melanie 05/25/2016 2:43:14 PM > can I send to you to  refill, thanks. Shelley Pooley 05/25/2016 2:54:21 PM > rx sent Prugh,Melanie  05/25/2016 3:27:33 PM > [name] notified of same            Refills    Refill atorvastatin tablet, 40 mg, orally, 30, 1 tab(s), once a  day (at bedtime), 30 days, Refills=0    ------          * * *                ---          * * *          Patient: [name], [name] DOB: 1951-06-28 Provider: Cora Daniels  05/25/2016    ---    Note generated by eClinicalWorks EMR/PM Software ([email or web address])

## 2016-06-08 ENCOUNTER — Ambulatory Visit (HOSPITAL_BASED_OUTPATIENT_CLINIC_OR_DEPARTMENT_OTHER): Admitting: Family Medicine

## 2016-06-08 NOTE — Progress Notes (Signed)
* * *        **[name], [name]**    ------    16 Y old Female, DOB: September 26, 1951    Account Number: [id number]    [address], [place], [place]    Home: [phone]    Guarantor: Ronney Asters Insurance: [facility] [place] (MEDICAID HMO) Payer  ID: 0    External Visit ID: [id number]    Appointment Facility: [facility]        * * *    06/08/2016    Progress notes: [name]    ------    ---        **Current Medications**    ---    Taking      * Freestyle Freedom Lite Lancets as directed 2x a day     ---    * Freestyle Freedom Lite Test Strips as directed 2x a day     ---    * multivitamin Multiple Vitamins capsule 1 cap(s) orally once a day     ---    * Prilosec OTC 20 mg delayed release tablet 1 tab(s) orally once a day     ---    * aspirin 81 mg delayed release tablet 1 tab(s) orally once a day     ---    * gabapentin 300 mg capsule 1 cap(s) orally 2 times a day     ---    * Citalopram Hydrobromide 10 mg tablet 1 tab(s) orally once a day     ---    * lisinopril 10 mg tablet 1 tab(s) orally once a day     ---    * metoprolol 100 mg tablet 1 tab(s) orally 2 times a day     ---    * glyburide 5 mg tablet 1 tab(s) orally once a day     ---    * metformin 1000 mg tablet 1 tab(s) orally 2 times a day     ---    * atorvastatin 40 mg tablet 1 tab(s) orally once a day (at bedtime)     ---    * Medication List reviewed and reconciled with the patient     ---      **Past Medical History**    ---      HTN    ---    high cholesterol-TC-162 HDL-50 LDL-76 TG-182 done 8/41/32    ---    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- Dr Fayrene Fearing Kim.->01/31/16-No DM retinopathy    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 2daughter(s) - healthy.    ---      **Social History**    ---    Smoking  Status: never smoker  , Patient counseled on the dangers of tobacco  use: 06/08/2016.    Seat belt: yes.    no Alcohol.    no Drug use.    Exercise: yes.    Marital Status: married, husband - Antonio.    Number of household members: 4, husband, daughter, grandson.    Occupation: retired.    no Travel outside [place].    Caffeine: coffee in the am.    Pets: 1 cat, 3 dogs (chihuahuas).    Sexual orientation: heterosexual.    Sexually active: not currently.      **Gyn History**    ---    Periods : no spotting since menopause.    Date of Last Period unsure, somewhere around age [age].    Menarche 13.      **OB History**    ---    Total pregnancies 7\.    Total living children 2\.    Miscarriage(s) 5\.      **Allergies**    ---      N.K.D.A.    ---      **Review of Systems**    ---    _CONSTITUTIONAL_ :    no Fever. no Chills. no Sweats. c/o Fatigue.    _OPTHALMOLOGY_ :    no Change in Vision.    _ENT_ :    no Nasal congestion. no Rhinorrhea. no Sore Throat. no Cough. no Otalgia.    _RESPIRATORY_ :    no Shortness of Breath. no DOE (dyspnea on exertion).    _CARDIOLOGY_ :    no Chest Pain. no Shortness of Breath. no Palpitations. no Dizziness. no Leg  Edema.    _GASTROENTEROLOGY_ :    no Abdominal Pain. no Nausea. no Vomiting.    _NEUROLOGY_ :    no Headaches.    _PSYCHOLOGY_ :    c/o Depression. c/o Stressors.    _DERMATOLOGY_ :    no Rash.            **Reason for Appointment**    ---      1\. 4wk f/u  meds    ---    2\. Needs PCV13 and Flu    ---      **History of Present Illness**    ---    _PHQ Form_ :    PHQ9 Form Little interest or pleasure in doing things Several days, Feeling  down, depressed, hopeless Not at all, Trouble falling or staying asleep, or  sleeping to much More than half the days, Feeling tired or little energy  Several days, Poor appetite or overeating Several days, Feeling bad about  yourself-or that you are a failure or let yourself or your family down Not at  all, Trouble concentrating on things such as reading the newspaper or watching  television Not at all, Moving or speaking so slowly that other people could  have noticed. or the opposite - being so fidgety or restless that you have  been moving around a lot more than usual Not at all, Thoughts that you were  better off dead, or of hurting yourself in someway? Not at all, Total score 5,  Interpretation Mild Depression.    _Interim History_ :    Needs refills of gabapentin, citalopram, and atorvastatin. Takes Gabapentin  for neuropathy.    _Follow-up_ :    65 year old female presents for follow up of depression. Here with daughter  who interprets for her.She started taking Citalopram 4 weeks ago. States it is  helping.      **Vital Signs**    ---    HR 63, BP 124/66, O2 Sat. 97, Ht 58, Wt 165, Wt Change -1 lb, BMI  34.48  .      **Examination**    ---    _General Examination_ :    General Appearance:  alert and oriented  ,  NAD  .    HEENT:  moist mucous membranes  ,  PERRLA  ,  EOMI  .    Lymph nodes  none  .    Heart:  RRR, normal S1S2  ,  no murmurs  .    Lungs:  clear to auscultation bilaterally  ,  no wheezes/rhonchi/rales  .    Extremities  no edema  .    Skin:  no rash or skin lesions  .          **Assessments**    ---    1\. 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 Diabetic neuropathy - E11.40    ---       **Treatment**    ---      **1\. Adjustment disorder with depressed mood**    Continue Citalopram Hydrobromide tablet, 10 mg, 1 tab(s), orally, once a day,  30 days, 30, Refills 6    Continue atorvastatin tablet, 40 mg, 1 tab(s), orally, once a day (at  bedtime), 30 days, 30, Refills 11    Notes: Better with Citalopram. PHQ 9 score better at 5 from last time at 10.  Will continue Citalopram. Counseling given.    ---        **2\. Diabetic neuropathy**    Continue gabapentin capsule, 300 mg, 1 cap(s), orally, 2 times a day, 30 days,  60, Refills 3      **Follow Up**    ---    3 Months    Electronically signed by [facility] Sofya Moustafa on 06/10/2016 at 10:31 PM EDT    Sign off status: Completed        * * [facility]    [address]    [place], [place] [address]    Tel: [phone]    Fax: [phone]              * * *          Patient: [name], [name] DOB: 1951/03/26 Progress Note: [name]  06/08/2016    ---    Note generated by eClinicalWorks EMR/PM Software ([email or web address])

## 2016-08-21 ENCOUNTER — Ambulatory Visit (HOSPITAL_BASED_OUTPATIENT_CLINIC_OR_DEPARTMENT_OTHER): Admitting: Family Medicine

## 2016-08-21 NOTE — Progress Notes (Signed)
* * *        **  [name]**    ------    65 Y old Female, DOB: Jul 22, 1951    [address], [place], [place] [address]    Home: [phone]    Provider: Alfonso Ramus        * * *    Telephone Encounter    ---    Answered by    Sharyn Dross    Date: 08/21/2016        Time: 03:16 PM    Caller    [name]- daughter    ------            Reason    Refills metoprolol            Message                      metoprolol 100 mg BID            [phone]                Action Taken    [name],[name] 08/21/2016 3:17:24 PM > Last PE 10/14/15.  Last f/u 06/08/16. Has f/u scheduled 09/07/16. Regla Fitzgibbon B 08/21/2016  9:16:29 PM > Rx esent            Refills    Refill metoprolol tablet, 100 mg, orally, 180, 1 tab(s), 2 times a  day, 90 days, Refills=0    ------          * * *                ---          * * *          Patient: [name], [name] DOB: 12-16-1950 Provider: Peggye Form B  08/21/2016    ---    Note generated by eClinicalWorks EMR/PM Software ([email or web address])

## 2016-09-07 ENCOUNTER — Ambulatory Visit (HOSPITAL_BASED_OUTPATIENT_CLINIC_OR_DEPARTMENT_OTHER): Admitting: Family Medicine

## 2016-09-07 NOTE — Progress Notes (Signed)
* * *        **[name], [name]**    ------    27 Y old Female, DOB: 10-05-1950    Account Number: [id number]    [address] [place], [place]    Home: [phone]    Guarantor: Ronney Asters Insurance: [facility] [place] (MEDICAID HMO) Payer  ID: 0    External Visit ID: [id number]    Appointment Facility: [facility]        * * *    09/07/2016    Progress notes: Jaylee Freeze    ------    ---        **Current Medications**    ---    Taking      * Freestyle Freedom Lite Lancets as directed 2x a day     ---    * Freestyle Freedom Lite Test Strips as directed 2x a day     ---    * multivitamin Multiple Vitamins capsule 1 cap(s) orally once a day     ---    * Prilosec OTC 20 mg delayed release tablet 1 tab(s) orally once a day     ---    * aspirin 81 mg delayed release tablet 1 tab(s) orally once a day     ---    * lisinopril 10 mg tablet 1 tab(s) orally once a day     ---    * glyburide 5 mg tablet 1 tab(s) orally once a day     ---    * metformin 1000 mg tablet 1 tab(s) orally 2 times a day     ---    * gabapentin 300 mg capsule 1 cap(s) orally 2 times a day     ---    * Citalopram Hydrobromide 10 mg tablet 1 tab(s) orally once a day     ---    * atorvastatin 40 mg tablet 1 tab(s) orally once a day (at bedtime)     ---    * metoprolol 100 mg tablet 1 tab(s) orally 2 times a day     ---    * Medication List reviewed and reconciled with the patient     ---      **Past Medical History**    ---      HTN    ---    high cholesterol-TC-162 HDL-50 LDL-76 TG-182 done 8/65/78    ---    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- Dr Fayrene Fearing Kim.->01/31/16-No DM retinopathy    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    ---    Cardiac stress test-03/01/15-positive EKG changes but neg for inducible wall  motion abnormalities    ---     Lung nodule- following Dr Lattie Corns seen 01/2015    ---      **Surgical History**    ---      ectopic pregnancies    ---    left ankle screws 2010    ---      **Family History**    ---      Father: deceased 76 yrs, MI, diagnosed with Hypertension, Heart Disease    ---    Mother: deceased 21 yrs, DMII, cirrhoisis of the liver, diagnosed with  Diabetes    ---    Sibling 1: sister ovarian/uterus CA, diagnosed with Diabetes, Hypertension    ---    Sibling 2: brother - DMII, HTN, diagnosed with Diabetes, Hypertension    ---    4 brother(s) , 2 sister(s) . 2daughter(s) - healthy.    ---      **Social History**    ---    Smoking  Status: never smoker  , Patient counseled on the dangers of tobacco  use: 09/07/2016.    Seat belt: yes.    no Alcohol.    no Drug use.    Exercise: yes.    Marital Status: married, husband - Antonio.    Number of household members: 4, husband, daughter, grandson.    Occupation: retired.    no Travel outside [place].    Caffeine: coffee in the am.    Pets: 1 cat, 3 dogs (chihuahuas).    Sexual orientation: heterosexual.    Sexually active: not currently.      **Gyn History**    ---    Periods : no spotting since menopause.    Date of Last Period unsure, somewhere around age [age].    Menarche 13.      **OB History**    ---    Total pregnancies 7\.    Total living children 2\.    Miscarriage(s) 5\.      **Allergies**    ---      N.K.D.A.    ---      **Hospitalization/Major Diagnostic Procedure**    ---      No Hospitalization History.    ---      **Review of Systems**    ---    _CONSTITUTIONAL_ :    no Fever. no Chills. no Loss of Appetite.    _OPTHALMOLOGY_ :    no Change in Vision.    _RESPIRATORY_ :    no Shortness of Breath. no DOE (dyspnea on exertion).    _CARDIOLOGY_ :    no Chest Pain. no Shortness of Breath. no Palpitations. no Dizziness.    _GASTROENTEROLOGY_ :    no Abdominal Pain. no Nausea. no Vomiting.    _NEUROLOGY_ :    no Headaches.    _DERMATOLOGY_ :    c/o Rash.             **Reason for Appointment**    ---      1\. 3 month f/u meds    ---    2\. Flu    ---    3\. Due for PE next month    ---      **History of Present Illness**    ---    _Sick Visit_ :    65 year old female presents for follow up of depression. On Citalopram 10 mg  doing well. Had insurance issues stopped taking her medications for 5 days.  Resume 2 weeks ago.    No other concerns.      **Vital Signs**    ---    HR 52, BP 138/70, O2 Sat. 98, Ht 58, Wt 163, Wt Change -2 lb, BMI  34.06  .      **Examination**    ---    _General Examination_ :    General Appearance:  alert and oriented  ,  NAD  .    HEENT:  moist mucous membranes  ,  PERRLA  ,  EOMI  .    Heart:  RRR, normal S1S2  ,  no  murmurs  .    Lungs:  clear to auscultation bilaterally  ,  no wheezes/rhonchi/rales  .    Extremities  no edema  .    Skin:  erythematous plaque on abdominal fold.  .          **Assessments**    ---    1\. Intertrigo - L30.4 (Primary)    ---    2\. Adjustment disorder with depressed mood - F43.21    ---    3\. Need for influenza vaccination - Z23    ---      **Treatment**    ---      **1\. Intertrigo**    Start Lotrisone cream, 0.05%-1%, 1 app, applied topically, 2 times a day, 14  days, 1, Refills 0    Notes: Will treat with Lotrisone, Keep clean and dry.    ---        **2\. Adjustment disorder with depressed mood**    Notes: Continue Celexa. Counseling given.        **3\. Need for influenza vaccination**    Notes: Patient Educated with: FLU Inactive.pdf (FLU Inactive.pdf).      **Follow Up**    ---    CPE    Electronically signed by Peggye Form on 10/03/2016 at 07:35 AM EST    Sign off status: Completed        * * [facility]    [address]    [place], [place] [address]    Tel: [phone]    Fax: [phone]              * * *          Patient: [name], [name] DOB: 1951-08-01 Progress Note: [name]  09/07/2016    ---    Note generated by eClinicalWorks EMR/PM Software  ([email or web address])

## 2016-09-12 ENCOUNTER — Ambulatory Visit (HOSPITAL_BASED_OUTPATIENT_CLINIC_OR_DEPARTMENT_OTHER): Admitting: Family Medicine

## 2016-09-12 NOTE — Progress Notes (Signed)
* * *        **[name], [name]**    ------    65 Y old Female, DOB: Sep 15, 1962    Account Number: [id number]    [address], [place], [place]    Home: [phone]    Guarantor: Ronney Asters Insurance: [facility] [place] (MEDICAID HMO) Payer  ID: 0    External Visit ID: [id number]    Appointment Facility: [facility]        * * *    09/12/2016    Progress Note: [name]    ------    ---        **Current Medications**    ---    Taking      * Freestyle Freedom Lite Lancets as directed 2x a day     ---    * Freestyle Freedom Lite Test Strips as directed 2x a day     ---    * multivitamin Multiple Vitamins capsule 1 cap(s) orally once a day     ---    * Prilosec OTC 20 mg delayed release tablet 1 tab(s) orally once a day     ---    * aspirin 81 mg delayed release tablet 1 tab(s) orally once a day     ---    * lisinopril 10 mg tablet 1 tab(s) orally once a day     ---    * glyburide 5 mg tablet 1 tab(s) orally once a day     ---    * metformin 1000 mg tablet 1 tab(s) orally 2 times a day     ---    * gabapentin 300 mg capsule 1 cap(s) orally 2 times a day     ---    * Citalopram Hydrobromide 10 mg tablet 1 tab(s) orally once a day     ---    * atorvastatin 40 mg tablet 1 tab(s) orally once a day (at bedtime)     ---    * metoprolol 100 mg tablet 1 tab(s) orally 2 times a day     ---    * Lotrisone 0.05%-1% cream 1 app applied topically 2 times a day     ---    * Medication List reviewed and reconciled with the patient     ---      **Past Medical History**    ---      HTN    ---    high cholesterol-TC-162 HDL-50 LDL-76 TG-182 done 6/96/29    ---    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- Dr Fayrene Fearing Kim.->01/31/16-No DM retinopathy    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    ---    Cardiac stress test-03/01/15-positive  EKG changes but neg for inducible wall  motion abnormalities    ---    Lung nodule- following Dr Lattie Corns seen 01/2015    ---      **Surgical History**    ---      ectopic pregnancies    ---    left ankle screws 2010    ---      **Family History**    ---      Father: deceased 64 yrs, MI, diagnosed with Hypertension,  Heart Disease    ---    Mother: deceased 25 yrs, DMII, cirrhoisis of the liver, diagnosed with  Diabetes    ---    Sibling 1: sister ovarian/uterus CA, diagnosed with Diabetes, Hypertension    ---    Sibling 2: brother - DMII, HTN, diagnosed with Diabetes, Hypertension    ---    4 brother(s) , 2 sister(s) . 2daughter(s) - healthy.    ---      **Social History**    ---    Smoking  Status: never smoker  , Patient counseled on the dangers of tobacco  use: 09/12/2016.    Seat belt: yes.    no Alcohol.    no Drug use.    Exercise: yes.    Marital Status: married, husband - Antonio.    Number of household members: 4, husband, daughter, grandson.    Occupation: retired.    no Travel outside [place].    Caffeine: coffee in the am.    Pets: 1 cat, 3 dogs (chihuahuas).    Sexual orientation: heterosexual.    Sexually active: not currently.      **Gyn History**    ---    Periods : no spotting since menopause.    Date of Last Period unsure, somewhere around age [age].    Menarche 13.      **OB History**    ---    Total pregnancies 7\.    Total living children 2\.    Miscarriage(s) 5\.      **Allergies**    ---      N.K.D.A.    ---      **Hospitalization/Major Diagnostic Procedure**    ---      No Hospitalization History.    ---      **Review of Systems**    ---    _CONSTITUTIONAL_ :    no Fever. no Chills.    _OPTHALMOLOGY_ :    no Change in Vision.    _RESPIRATORY_ :    no Shortness of Breath. no DOE (dyspnea on exertion).    _CARDIOLOGY_ :    no Chest Pain. no Shortness of Breath. no Palpitations. no Dizziness.    _GASTROENTEROLOGY_ :    no Abdominal Pain. no Nausea. no Vomiting.    _NEUROLOGY_ :    no  Headaches.    _DERMATOLOGY_ :    no Rash.            **Reason for Appointment**    ---      1\. Back pain per dtr.    ---      **History of Present Illness**    ---    _Sick Visit_ :    65 year old female presents with back pain. It began 5 days ago. Intermittent.  7/10. Sharp pain Bending down make it worse. Denies numbness and tingling of  the extremities. No bowel incontinence.Took Advil which helps temporarily.  Wake her up at night. Had the same pain in the past.      **Vital Signs**    ---    HR 63, BP 130/72, O2 Sat. 98, Ht 58, Wt 165, Wt Change 2 lb, BMI  34.48  .      **Examination**    ---    _General Examination_ :    General Appearance:  alert and oriented  ,  NAD  .    HEENT:  moist mucous membranes  ,  PERRLA  ,  EOMI  .    Lymph  nodes  none  .    Heart:  RRR, normal S1S2  ,  no murmurs  .    Lungs:  clear to auscultation bilaterally  ,  no wheezes/rhonchi/rales  .    Extremities  no edema  .    Skin:  no rash or skin lesions  .          **Assessments**    ---    1\. Thoracic back pain - M54.6 (Primary)    ---    2\. Need for influenza vaccination - Z23    ---      **Treatment**    ---      **1\. Thoracic back pain**    Start naproxen tablet, 500 mg, 1 tab(s), orally, 2 times a day, 15 days, 30  Tablet, Refills 0    Notes: Likely strain/sprain. Ice or heat. NSAID. Stretching exercises. Patient  Educated with: Mid-Back Strain.pdf (Mid-Back Strain.pdf).    ---        **2\. Need for influenza vaccination**    Notes: Patient Educated with: FLU Inactive.pdf (FLU Inactive.pdf).      **Immunization**    ---      Influenza (split) adult : 0.5 mL (Dose No:1) given by Sharyn Dross  on Left Deltoid    ---      **Procedure Codes**    ---      [phone] Influenza (whole)    ---    [phone] IMMUNIZATION ADMIN    ---      **Follow Up**    ---    prn    Electronically signed by Peggye Form on 09/19/2016 at 10:20 PM EST    Sign off status: Completed        * * [facility]    [address]    [place], [place] [address]    Tel: [phone]    Fax: [phone]              * * *          Patient: DANTE, COOTER DOB: 20-Jul-1951 Progress Note: Annalycia Done  09/12/2016    ---    Note generated by eClinicalWorks EMR/PM Software ([email or web address])

## 2016-09-16 ENCOUNTER — Ambulatory Visit (HOSPITAL_BASED_OUTPATIENT_CLINIC_OR_DEPARTMENT_OTHER): Admitting: Family Medicine

## 2016-09-16 NOTE — Progress Notes (Signed)
* * *        **  [name]**    ------    70 Y old Female, DOB: 1951/05/27    [address], [place], [place] [address]    Home: [phone]    Provider: Alfonso Ramus        * * *    Telephone Encounter    ---    Answered by    Peggye Form B    Date: 09/16/2016        Time: 08:16 AM    Reason    Flu vaccine    ------            Action Taken    [place] B 09/16/2016 8:16:51 AM > Did she received Flu  vaccine? [name],[name] 09/18/2016 12:58:06 PM > [name] states Jazzalyn did  receive the flu vaccine. Documented in the note.                * * *                ---          * * *          Patient: [name], [name] DOB: 11/07/1950 Provider: Peggye Form B  09/16/2016    ---    Note generated by eClinicalWorks EMR/PM Software ([email or web address])

## 2016-09-18 ENCOUNTER — Ambulatory Visit (HOSPITAL_BASED_OUTPATIENT_CLINIC_OR_DEPARTMENT_OTHER): Admitting: Family Medicine

## 2016-09-18 NOTE — Progress Notes (Signed)
* * *        **  [name]**    ------    88 Y old Female, DOB: 09/14/1951    [address] [place], [place] [address]    Home: [phone]    Provider: Alfonso Ramus        * * *    Telephone Encounter    ---    Answered by    Sharyn Dross    Date: 09/18/2016        Time: 11:44 AM    Caller    [name]- daughter    ------            Reason    Refills lisinopril            Message                      lisinopril 5 mg daily            [phone]                Action Taken    [name],[name] 09/18/2016 1:00:48 PM > Has PE scheduled  11/02/16. Louvenia Golomb B 09/19/2016 12:46:04 PM > Rx esent            Refills    Refill lisinopril tablet, 10 mg, orally, 30, 1 tab(s), once a day,  30 days, Refills=3    ------          * * *                ---          * * *          Patient: [name], [name] DOB: 09-09-1951 Provider: Peggye Form B  09/18/2016    ---    Note generated by eClinicalWorks EMR/PM Software ([email or web address])

## 2016-09-27 ENCOUNTER — Ambulatory Visit (HOSPITAL_BASED_OUTPATIENT_CLINIC_OR_DEPARTMENT_OTHER): Admitting: Family Medicine

## 2016-09-27 NOTE — Progress Notes (Signed)
* * *        **  [name]**    ------    89 Y old Female, DOB: Feb 09, 1951    [address] [place], [place] [address]    Home: [phone]    Provider: Alfonso Ramus        * * *    Telephone Encounter    ---    Answered by    Peggye Form B    Date: 09/27/2016        Time: 04:58 PM    Reason    Flu vaccine    ------            Action Taken    Jashua Knaak B 09/27/2016 4:59:01 PM > Pls enter into the  note. See other TE. [name],[name] 09/27/2016 5:03:08 PM > Immunization  is documented in the SV note from 09/12/16. [name] B 10/09/2016 6:18:56  PM >                * * *                ---          * * *          Patient: [name], [name] DOB: [birth year] Provider: Peggye Form B  09/27/2016    ---    Note generated by eClinicalWorks EMR/PM Software ([email or web address])

## 2016-11-23 ENCOUNTER — Ambulatory Visit (HOSPITAL_BASED_OUTPATIENT_CLINIC_OR_DEPARTMENT_OTHER): Admitting: Family Medicine

## 2016-11-23 NOTE — Progress Notes (Signed)
* * *        **  [name]**    ------    47 Y old Female, DOB: 1951/03/31    [address], [place], [place] [address]    Home: [phone]    Provider: Alfonso Ramus        * * *    Telephone Encounter    ---    Answered by    Sharyn Dross    Date: 11/23/2016        Time: 12:06 PM    Caller    [name]    ------            Reason    Refills metoprolol            Message                      metoprolol BID                Action Taken    [name],[name] 11/23/2016 12:16:31 PM > Has PE scheduled  12/14/16. [name] 11/23/2016 12:23:39 PM > Rx esent            Refills    Refill metoprolol tablet, 100 mg, orally, 180, 1 tab(s), 2 times a  day, 90 days, Refills=0    ------          * * *                ---          * * *          Patient: [name], [name] DOB: 01-02-1951 Provider: Peggye Form B  11/23/2016    ---    Note generated by eClinicalWorks EMR/PM Software ([email or web address])

## 2016-12-14 ENCOUNTER — Ambulatory Visit (HOSPITAL_BASED_OUTPATIENT_CLINIC_OR_DEPARTMENT_OTHER): Admitting: Family Medicine

## 2016-12-14 NOTE — Progress Notes (Signed)
* * *        **  [name]**    ------    14 Y old Female, DOB: 1951-05-28    [address] [place], [place] [address]    Home: [phone]    Provider: Alfonso Ramus        * * *    Telephone Encounter    ---    Answered by    Rock Nephew    Date: 12/14/2016        Time: 04:31 PM    Caller    daughter    ------            Reason    order for xray            Message                      Pt seen today and was wondering if they could have an order for a foot xray.                Action Taken    Darral Rishel B 12/14/2016 5:02:18 PM > Order etransmitted  Fauvel,Melanie 12/14/2016 5:09:44 PM > pt aware                * * *                ---          * * *          Patient: [name], [name] DOB: May 26, 1951 Provider: Peggye Form B  12/14/2016    ---    Note generated by eClinicalWorks EMR/PM Software ([email or web address])

## 2016-12-14 NOTE — Progress Notes (Signed)
* * *        **[name], [name]**    ------    66 Y old Female, DOB: [birth year]    Account Number: [id number]    [address], [place], [place]    Home: [phone]    Guarantor: [name] Insurance: [employer] [place] (MEDICAID [id number]) Payer  ID: [id number]    External Visit ID: [id number]    Appointment Facility: [facility]        * * *    12/14/2016    Progress Note: Mecca Barga    ------    ---        **Current Medications**    ---    Taking      * Freestyle Freedom Lite Lancets as directed 2x a day     ---    * Freestyle Freedom Lite Test Strips as directed 2x a day     ---    * multivitamin Multiple Vitamins capsule 1 cap(s) orally once a day     ---    * Prilosec OTC 20 mg delayed release tablet 1 tab(s) orally once a day     ---    * aspirin 81 mg delayed release tablet 1 tab(s) orally once a day     ---    * glyburide 5 mg tablet 1 tab(s) orally once a day     ---    * metformin 1000 mg tablet 1 tab(s) orally 2 times a day     ---    * gabapentin 300 mg capsule 1 cap(s) orally 2 times a day     ---    * Citalopram Hydrobromide 10 mg tablet 1 tab(s) orally once a day     ---    * atorvastatin 40 mg tablet 1 tab(s) orally once a day (at bedtime)     ---    * Lotrisone 0.05%-1% cream 1 app applied topically 2 times a day     ---    * naproxen 500 mg tablet 1 tab(s) orally 2 times a day     ---    * lisinopril 10 mg tablet 1 tab(s) orally once a day     ---    * metoprolol 100 mg tablet 1 tab(s) orally 2 times a day     ---    * Medication List reviewed and reconciled with the patient     ---      **Past Medical History**    ---      HTN    ---    high cholesterol-TC-162 HDL-50 LDL-76 TG-182 done 8/65/78    ---    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- Dr Fayrene Fearing Kim.->01/31/16-No DM retinopathy    ---    GERD    ---    BMD-Normal-02/23/14    ---    Monofilament test-11/18/15-Normal    ---    Insure Fit-06/13/15- Negative    ---    Pap-06/13/15 -Negative    ---    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    ---    Cardiac stress test-03/01/15-positive EKG changes but neg for inducible wall  motion abnormalities    ---    Lung nodule- following Dr Lattie Corns seen 01/2015    ---      **Surgical History**    ---      ectopic pregnancies    ---    left ankle screws 2010    ---      **  Family History**    ---      Father: deceased 63 yrs, MI, diagnosed with Hypertension, Heart Disease    ---    Mother: deceased 48 yrs, DMII, cirrhoisis of the liver, diagnosed with  Diabetes    ---    Sibling 1: sister ovarian/uterus CA, diagnosed with Diabetes, Hypertension    ---    Sibling 2: brother - DMII, HTN, diagnosed with Diabetes, Hypertension    ---    4 brother(s) , 2 sister(s) . 2daughter(s) - healthy.    ---      **Social History**    ---    Sexual History  Had sex in the past 12 months (vaginal, oral, or anal)? No  ,  Have you ever had an STD? No.    Smoking  Status: never smoker  , Patient counseled on the dangers of tobacco  use: 09/12/2016.    Seat belt: yes.    no Alcohol.    no Drug use.    Exercise: yes.    Marital Status: married, husband - Antonio.    Children: two Daughters.    Number of household members: 4, husband, daughter, grandson.    Occupation: retired.    no Travel outside [place].    Caffeine: coffee in the am.    Pets: 1 cat, 3 dogs (chihuahuas).    Sexual orientation: heterosexual.    Sexually active: not currently.      **Gyn History**    ---    Periods : no spotting since menopause.    Last pap smear date 2016 no HPV done.    Last mammogram date 2017.    Date of Last Period unsure, somewhere around age 49-55.    Menarche 13.      **OB History**    ---    Total pregnancies 7\.    Total living children 2\.    Miscarriage(s) 5\.      **Allergies**    ---      N.K.D.A.    ---      **Hospitalization/Major Diagnostic Procedure**    ---      No Hospitalization History.    ---      **Review of Systems**    ---    _CONSTITUTIONAL_ :    Unexplained weight change  none  .     _OPTHALMOLOGY_ :    Glasses or contacts  glasses  .    _ENT_ :    Hoarseness  none  . Swollen Lymph Nodes  none  . Difficulty swallowing  no  .  Dental care  up-to-date  .    _ALLERGY_ :    Environmental Allergies  none  .    _RESPIRATORY_ :    Shortness of Breath  none  . Persistent Cough  none  . Wheezing  none  .    _CARDIOLOGY_ :    Chest Pain  none  . Palpitations  none  . Leg Edema  none  . Exertional  symptoms  none  .    _GASTROENTEROLOGY_ :    Dysphagia  none  . Abdominal Pain  none  . Constipation  none  . Diarrhea  none  . Blood in Stool  none  . Heartburn  none  .    _FEMALE REPRODUCTIVE_ :    Hot Flashes  none  . Abnormal Vaginal Discharge  none  . Dyspareunia  none  .  Irregular Menses  none  . Breast lump  none  .  Practices SBE  regulary  .  Vaginal dryness  none  .    _UROLOGY_ :    Blood in Urine  none  . Urinary Incontinence  none  . Nocturia  None  .  Dysuria  none  .    _MUSCULOSKELETAL_ :    c/o Joint Pain. Joint Stiffness  none  . c/o Joint Swelling.    _NEUROLOGY_ :    Tingling/Numbness  none  . Dizziness  none  . Weakness  none  . Visual Changes  none  . Headaches  none  . Syncope  none  .    _PSYCHOLOGY_ :    Depression  none  . Sleep Disturbances  none  . Anxiety  none  .    _DERMATOLOGY_ :    New or changing skin lesions  none  .    _HEMATOLOGY/LYMPH_ :    Bleeding tendencies  none  .            **Reason for Appointment**    ---      1\. CPE    ---    2\. A1C, microalbumin    ---    3\. Needs adacel    ---    4\. No record of colonoscopy needs or insure cards    ---    5\. is DM eye exam scheduled due 5/18    ---      **History of Present Illness**    ---    _PHQ Form_ :    PHQ9 Form Little interest or pleasure in doing things Several days, Feeling  down, depressed, hopeless Several days, Trouble falling or staying asleep, or  sleeping to much Not at all, Feeling tired or little energy Several days, Poor  appetite or overeating Not at all, Feeling bad about yourself-or that you  are  a failure or let yourself or your family down Not at all, Trouble  concentrating on things such as reading the newspaper or watching television  Not at all, Moving or speaking so slowly that other people could have noticed.  or the opposite - being so fidgety or restless that you have been moving  around a lot more than usual Not at all, Thoughts that you were better off  dead, or of hurting yourself in someway? Not at all, Total score 3,  Interpretation Minimal Depression.    _Female CPE_ :    66 year old female presents today for an annual physical examination. Patient  states she broke bilateral feet eight years ago and she has pain in her left  ankle. It is painful with bearing weight on it. No new injury. Also report of  right foot pain localized at the bottom of the heel. It occurs with first step  in the morning. Patient states she needs new script for Gabapentin.    PAP -  Date done - 09/14/016  ,  Negative  . Mammogram -  Date done -  12/15/2015  ,  Normal  . Colonoscopy -  Never had colonoscopy  . Immunizations  Due forTdap  .    Following Dr Lattie Corns for lung nodule. Has appt this April.    _Diabetes_ :    DIABETES F/U  doing well and without complaints  . HOME GLUCOSE MONITORING  AM  fasting:120-140's  . HYPOGLYCEMIC EPISODES  none  . COMPLIANCE  with  medications. On Metformin and Glyburide  . HGBA1C  7.0%  . LDL  68  .  MICROALBUMINURIA  elevated  . LAST EYE EXAM Date 01/31/2016, Copy of Report on  file? Yes. EYE DOCTOR  Dr Lonia Gillett  . FLU SHOT  received  . PNEUMOVAX  received  . DIABETIC COMPLICATIONS  diabetic nephropathy  ,  diabetic  neuropathy  .      **Vital Signs**    ---    HR 62, BP 130/72L, Temp 98.8, O2 Sat. 97, Ht 58, Wt 165.0, BMI  34.48  .      **Examination**    ---    _General Examination_ :    General  well nourished, well-hydrated female in  NAD  .    HEENT:  TMs and ear canals clear, conjunctiva clear, nares patent, oropharynx  clear with MMM  .    Neck, Thyroid :  supple  ,   no thyromegaly  ,  no lymphadenopathy  ,  no JVD  ,  no carotid bruit  .    Breasts :  no lumps felt on either side  ,  no dimpling  ,  no skin changes  ,  no axillary lymphadenopathy  .    Heart:  RRR  ,  no murmurs  .    Lungs:  clear to auscultation bilaterally  .    Abdomen:  soft, NT/ND, BS present  ,  no hepatosplenomegaly  .    Extremities  normal ROM  ,  no edema  .    Peripheral pulses:  normal (2+) bilaterally  .    Skin:  no rash or suspicious skin lesions  .    Neurologic Exam:  non-focal exam  .          **Assessments**    ---    1\. Routine medical exam - Z00.00 (Primary)    ---    2\. 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 Type 2 diabetes mellitus - E11.9    ---    4\. Pulmonary nodule - R91.1    ---    5\. Encounter for screening mammogram for malignant neoplasm of breast -  Z12.31    ---    6\. Encounter for screening for malignant neoplasm of colon - Z12.11    ---    7\. Left ankle pain - M25.572    ---    8\. Plantar fasciitis of right foot - M72.2    ---    9\. Need for TD vaccine - Z23    ---      **Treatment**    ---      **1\. Routine medical exam**    _LAB: Hemoglobin A1C_ 7.0%    A1C    7.0%        ------------      Fauvel,Melanie 12/14/2016 1:03:30 PM > Analicia Skibinski B 12/16/2016 6:35:04 AM  >    ------    _LAB: Microalbumin Urine_ 30  Microalbumin    30        ------------    Creatinine    200        ------------      Fauvel,Melanie 12/14/2016 1:04:01 PM > Vanecia Limpert B 12/16/2016 6:35:28 AM  >    ------        Notes: Normal exam except as noted in PE. Pap smear is up to date- will repeat  next year. Due for mammogram will book an appt. Never had colonoscopy- she  declines. Discussed the importance of doing colonoscopy that we  may be missing  early stage of cancer. She agreed to do [employer] given. Immunizations  updated. Received annual fu shot. Discussed diabetic diet and importance of  regular exercise. Routine  eye and dental exam.        **2\. Type 2 diabetes mellitus with diabetic neuropathy, unspecified**    Continue gabapentin capsule, 300 mg, 1 cap(s), orally, 2 times a day, 90 days,  180 Capsule, Refills 3    Continue aspirin delayed release tablet, 81 mg, 1 tab(s), orally, once a day    Continue glyburide tablet, 5 mg, 1 tab(s), orally, once a day    Continue metformin tablet, 1000 mg, 1 tab(s), orally, 2 times a day    Continue atorvastatin tablet, 40 mg, 1 tab(s), orally, once a day (at bedtime)    Continue lisinopril tablet, 10 mg, 1 tab(s), orally, once a day    Notes: Complicated with neuropathy and nephropathy. Hemoglobin A1C and LDL are  at goal. Normal monofilament test. Continue current medication. Due for eye  exam this May- will put the referral. Diabetic diet.    Referral [facility]    Reason:annual diabetic eye exam            **3\. Type 2 diabetes mellitus**    _LAB: CBC w/ Indices - [facility]_    _LAB: Comprehensive Metabolic Panel - [facility]_    _LAB: Lipid Panel_    _LAB: Hemoglobin A1C_ 7.0%    A1C    7.0%        ------------      Fauvel,Melanie 12/14/2016 1:03:30 PM > Amaani Guilbault B 12/16/2016 6:35:04 AM  >    ------    _LAB: Microalbumin Urine_ 30  Microalbumin    30        ------------    Creatinine    200        ------------      Fauvel,Melanie 12/14/2016 1:04:01 PM > Leone Mobley B 12/16/2016 6:35:28 AM  >    ------        **4\. Pulmonary nodule**    Notes: Seeing Dr Lattie Corns.        **5\. Encounter for screening mammogram for malignant neoplasm of breast**    _IMAGING: [facility] Breast Screening e-sch*_        **6\. Encounter for screening for malignant neoplasm of colon**    _LAB: INSURE FIT_        **7\. Left ankle pain**    _IMAGING: XR Ankle Complete Left e*_    Notes: NSAID. Modify activity. Ice elevation. Has h/o fracture and surgery.  Will get xray to evaluate etiology. If pain persist will refer her bak to  orthopedic.        **8\. Plantar  fasciitis of right foot**    Notes: Discussed the diagnosis. Ice, elevation. NSAID. Exercises. Patient  Educated with: Plantar Fasciitis.pdf (Plantar Fasciitis.pdf).      **Immunization**    ---      Td (adult) : 0.5 mL (Dose No:1) given by Shelbie Hutching, [place] on Left  Deltoid    ---      **Preventive Medicine**    ---      Counseling: Aspirin . Diet . Exercise . Calcium and vitamin D . Sunscreen .  Breast self exam .    ---    Screening / Special Tests: Pap Smear . HPV . Mammogram . Routine eye  exam/screening . Routine dental exams .    ---      **Procedure Codes**    ---  [address]109 ASSAY OF URINE CREATININE    ---    82044 MICROALBUMIN, SEMIQUANT    ---    60454 BLOOD DRAWING    ---    09811 GLYCATED HEMOGLOBIN TEST    ---    91478 TD VACCINE NO PRSRV >/= 7 IM    ---    29562 IMMUNIZATION ADMIN    ---      **Follow Up**    ---    Follow-up for annual visits or prn    Electronically signed by [facility] Temitayo Covalt on 12/16/2016 at 07:14 AM EDT    Sign off status: Completed        * * [facility]    [address]    [place], [place] [address]    Tel: [phone]    Fax: [phone]              * * *          Patient: KLOEY, CAZAREZ DOB: 1950/11/28 Progress Note: Cortland Crehan  12/14/2016    ---    Note generated by eClinicalWorks EMR/PM Software ([email or web address])

## 2016-12-18 ENCOUNTER — Ambulatory Visit (HOSPITAL_BASED_OUTPATIENT_CLINIC_OR_DEPARTMENT_OTHER): Admitting: Family Medicine

## 2016-12-18 ENCOUNTER — Ambulatory Visit: Admitting: Family Medicine

## 2016-12-18 LAB — HX COMPREHENSIVE METABOLIC PANEL
CASE NUMBER: 2018079001073
HX ALBUMIN LVL: 4.1 g/dL — NL (ref 3.5–5.0)
HX ALKALINE PHOSPHATASE: 106 U/L — NL (ref 45.0–117.0)
HX ALT: 26 U/L — NL (ref 6.0–78.0)
HX ANION GAP: 5 — NL (ref 3.0–11.0)
HX AST: 15 U/L — NL (ref 6.0–40.0)
HX BILIRUBIN TOTAL: 0.3 mg/dL — NL (ref 0.2–1.2)
HX BUN: 21 mg/dL — ABNORMAL HIGH (ref 7.0–18.0)
HX CALCIUM LVL: 9.3 mg/dL — NL (ref 8.5–10.1)
HX CHLORIDE: 105 mmol/L — NL (ref 98.0–110.0)
HX CO2: 29 mmol/L — NL (ref 21.0–32.0)
HX CREATININE: 1.09 mg/dL — NL (ref 0.55–1.3)
HX GLUCOSE LVL: 133 mg/dL — ABNORMAL HIGH (ref 65.0–110.0)
HX POTASSIUM LVL: 5.2 mmol/L — NL (ref 3.6–5.2)
HX SODIUM LVL: 139 mmol/L — NL (ref 136.0–145.0)
HX TOTAL PROTEIN: 7.3 g/dL — NL (ref 6.0–8.0)

## 2016-12-18 LAB — HX CBC W/ INDICES
CASE NUMBER: 2018079001073
HX HCT: 36.6 % — NL (ref 36.0–47.0)
HX HGB: 11.6 g/dL — ABNORMAL LOW (ref 11.8–15.8)
HX MCH: 26 pg — ABNORMAL LOW (ref 27.0–31.0)
HX MCHC: 31.7 g/dL — ABNORMAL LOW (ref 32.0–36.0)
HX MCV: 82 fL — NL (ref 81.0–99.0)
HX MPV: 11.4 fL — NL (ref 7.4–11.5)
HX NRBC PERCENT: 0 % — NL
HX PLATELET: 252 10*3/uL — NL (ref 150.0–400.0)
HX RBC: 4.46 10*6/uL — NL (ref 3.6–5.0)
HX RDW: 13.8 % — NL (ref 11.5–14.5)
HX WBC: 12 10*3/uL — ABNORMAL HIGH (ref 3.7–11.2)

## 2016-12-18 LAB — HX LIPID PANEL
CASE NUMBER: 2018079001073
HX CHOL: 156 mg/dL — NL (ref 140.0–200.0)
HX HDL: 55 mg/dL — NL (ref 41.0–60.0)
HX LDL: 67 mg/dL — NL (ref 0.0–129.0)
HX TRIG: 171 mg/dL — ABNORMAL HIGH (ref 0.0–149.0)

## 2016-12-18 LAB — HX GLOMERULAR FILTRATION RATE (ESTIMATED)
CASE NUMBER: 2018079001073
HX AFN AMER GLOMERULAR FILTRATION RATE: 62 mL/min/{1.73_m2}
HX NON-AFN AMER GLOMERULAR FILTRATION RATE: 54 mL/min/{1.73_m2}

## 2016-12-19 ENCOUNTER — Ambulatory Visit

## 2016-12-25 ENCOUNTER — Ambulatory Visit (HOSPITAL_BASED_OUTPATIENT_CLINIC_OR_DEPARTMENT_OTHER): Admitting: Family Medicine

## 2016-12-25 NOTE — Progress Notes (Signed)
* * *        **  [name]**    ------    63 Y old Female, DOB: [birth year]    [address] [place], [place] [address]    Home: [phone]    Provider: Alfonso Ramus        * * *    Telephone Encounter    ---    Answered by    Rubbie Battiest    Date: 12/25/2016        Time: 04:43 PM    Caller    [name], daughter [phone]    ------            Reason    results: labs, xray            Message                      She is calling for results, reviewed.  Did you have any specific comments?                Action Taken    [name] B 12/25/2016 5:26:18 PM > Discussed test results                * * *                ---          * * *          Patient: [name], [name] DOB: [birth year] Provider: Peggye Form B  12/25/2016    ---    Note generated by eClinicalWorks EMR/PM Software ([email or web address])

## 2017-01-11 ENCOUNTER — Ambulatory Visit: Admitting: Family Medicine

## 2017-01-23 ENCOUNTER — Ambulatory Visit

## 2017-01-28 ENCOUNTER — Ambulatory Visit (HOSPITAL_BASED_OUTPATIENT_CLINIC_OR_DEPARTMENT_OTHER): Admitting: Family Medicine

## 2017-01-28 ENCOUNTER — Ambulatory Visit: Admitting: Family Medicine

## 2017-01-28 NOTE — Progress Notes (Signed)
* * *        **  [name], [name]**    ------    3 Y old Female, DOB: Jul 14, 1951    [address], [place], [place] [address]    Home: [phone]    Provider: Alfonso Ramus        * * *    Telephone Encounter    ---    Answered by    Donney Dice    Date: 01/28/2017        Time: 09:29 AM    Caller    dtr    ------            Reason    lisinopril refill            Message                      last filled 09/18/16 w/ 3 refills,  cpe current 12/14/16  no pending appts at this time,  requesting this be sent to the above pharmacy                Action Taken    Brady Plant B 01/29/2017 5:58:42 AM > Rx sent  o'rourke,caitlin 01/29/2017 7:32:20 AM >            Refills    Refill lisinopril tablet, 10 mg, orally, 30 Tablet, 1 tab(s), once  a day, 30 days, Refills=0    ------          * * *                ---          * * *          Patient: [name], [name] DOB: [birth year] Provider: Peggye Form B  01/28/2017    ---    Note generated by eClinicalWorks EMR/PM Software ([email or web address])

## 2017-01-30 ENCOUNTER — Ambulatory Visit

## 2017-02-05 ENCOUNTER — Ambulatory Visit

## 2017-02-05 NOTE — Other (Addendum)
[facility] Intake Entered On:  02/05/2017 13:16     Performed On:  02/05/2017 13:15  by Burke Keels               Vital Signs   Pain Scale Used :   0-10   Pain Score :   0    Temperature Oral :   98.8 DegF   Peripheral Pulse Rate :   55 bpm   Respiratory Rate :   20 br/min   Systolic Blood Pressure :   143 mmHg (HI)    Diastolic Blood Pressure :   71 mmHg   Blood Pressure Extremity :   Left arm   Mean Arterial Pressure :   95 mmHg   SpO2 :   98 %   Oxygen Therapy 1 :   Room air   Burke Keels - 02/05/2017 13:15    Height/Weight   Height :   149 cm(Converted to: 4 foot 11 in, 58.66 in)    Body Surface Area :   1.7 m2   Weight :   75.9 Kg(Converted to: 167 lb 5 oz)    Body Mass Index :   34.19 Kg/m2   Type of Weight :   Standing   Type of Height :   Stated height   Burke Keels - 02/05/2017 13:15    [facility] General Information/Subjective   Chief Complaint :   follow up on pulmonary  nodules    High Risk for Falls :   No   Pain Symptoms :   No   Burke Keels - 02/05/2017 13:15    Patient Preferred Method of Communication   Send Letter   CCA Infectious Disease Risk Screening   Chills :   No   Fever :   No   Fatigue :   No   Headache :   No   Runny or Stuffy Nose :   No   Sore Throat :   No   Shortness of Breath :   No   New or Worsening Cough :   No   Vomiting :   No   Diarrhea :   No   Muscle Pain :   No   Recent Exposure to Communicable Disease :   No   Illness With Generalized Rash :   No   Burke Keels - 02/05/2017 13:15    Patient Travel History :   No recent travel   Recent Family Travel History :   No recent travel   Burke Keels - 02/05/2017 13:15    Allergy   (As Of: 02/05/2017 13:16:45 )   Allergies (Active)   NKA  Estimated Onset Date:   Unspecified ; Created By:   Onnie Boer; Reaction Status:   Active ; Category:   Drug ; Substance:   NKA ; Type:   Allergy ; Updated By:   Roslyn Smiling; Reviewed Date:   02/05/2017 13:16         Medication History   Medication List    (As Of: 02/05/2017 13:16:45 )   Home Meds    aspirin  :   aspirin ; Status:   Documented ; Ordered As Mnemonic:   aspirin ; Simple Display Line:   81 mg, 0 Refill(s) ; Catalog Code:   aspirin ; Order Dt/Tm:   02/05/2017 13:14:52          citalopram  :   citalopram ; Status:   Documented ; Ordered As Mnemonic:  CeleXA 10 mg oral tablet ; Simple Display Line:   0 Refill(s) ; Catalog Code:   citalopram ; Order Dt/Tm:   02/05/2017 13:14:00          lisinopril  :   lisinopril ; Status:   Documented ; Ordered As Mnemonic:   lisinopril 10 mg oral tablet ; Simple Display Line:   10 mg, 1 tab(s), PO, Daily, 0 Refill(s) ; Catalog Code:   lisinopril ; Order Dt/Tm:   01/31/2016 12:04:02          glyBURIDE  :   glyBURIDE ; Status:   Documented ; Ordered As Mnemonic:   glyBURIDE 2.5 mg oral tablet ; Simple Display Line:   2.5 mg, 1 tab(s), PO, Daily, 30 tab(s), 0 Refill(s) ; Catalog Code:   glyBURIDE ; Order Dt/Tm:   08/20/2014 13:34:05          atorvastatin  :   atorvastatin ; Status:   Documented ; Ordered As Mnemonic:   atorvastatin 40 mg oral tablet ; Simple Display Line:   40 mg, 1 tab(s), PO, Daily, 30 tab(s), 0 Refill(s) ; Catalog Code:   atorvastatin ; Order Dt/Tm:   08/20/2014 13:33:49          ibuprofen  :   ibuprofen ; Status:   Documented ; Ordered As Mnemonic:   Advil ; Simple Display Line:   200 mg, PO, q6hr, PRN pain ; Catalog Code:   ibuprofen ; Order Dt/Tm:   12/08/2013 12:59:04          acetaminophen  :   acetaminophen ; Status:   Documented ; Ordered As Mnemonic:   Tylenol ; Simple Display Line:   PO, PRN pain ; Catalog Code:   acetaminophen ; Order Dt/Tm:   12/08/2013 12:58:49          multivitamin  :   multivitamin ; Status:   Documented ; Ordered As Mnemonic:   multivitamin ; Simple Display Line:   1 tab(s), PO, Daily ; Catalog Code:   multivitamin ; Order Dt/Tm:   12/29/2012 09:37:16          omeprazole  :   omeprazole ; Status:   Documented ; Ordered As Mnemonic:   Prilosec OTC 20 mg oral enteric coated  tablet ; Simple Display Line:   20 mg, 1 tab(s), PO, Daily ; Catalog Code:   omeprazole ; Order Dt/Tm:   12/29/2012 09:37:29          simvastatin  :   simvastatin ; Status:   Discontinued ; Ordered As Mnemonic:   Simvastatin ; Simple Display Line:   80 mg, PO, HS ; Catalog Code:   simvastatin ; Order Dt/Tm:   12/29/2012 09:37:03          metformin  :   metformin ; Status:   Documented ; Ordered As Mnemonic:   metformin ; Simple Display Line:   1,000 mg, PO, BID ; Catalog Code:   metFORMIN ; Order Dt/Tm:   02/23/2009 09:39:53          metoprolol  :   metoprolol ; Status:   Documented ; Ordered As Mnemonic:   metoprolol ; Simple Display Line:   100 mg, PO, BID ; Catalog Code:   metoprolol ; Order Dt/Tm:   02/23/2009 09:39:23            CCA Social History   Cigarette Smoking Last 365 Days :   No   Exposure to Tobacco Smoke :   Never  smoker   Patient used other tobacco products in the last 30 days? :   No   Burke Keels - 02/05/2017 13:15    Social History   (As Of: 02/05/2017 13:16:45 )   Tobacco:  Denies Tobacco Use       Comments:  01/31/2016 12:04 - Doyne Keel: NEver Smoker   (Last Updated: 01/31/2016 12:04:52  by Doyne Keel)          Alcohol:  Denies Alcohol Use      (Last Updated: 12/29/2012 09:40:27  by Fulton Reek )         Employment/School:        Retired, Work/School description: Housekeeper.   (Last Updated: 12/08/2013 13:00:10  by Lacretia Nicks RN, Frazier Butt)          Exercise:        Exercise duration: 20.  Exercise type: Walking.   (Last Updated: 12/08/2013 13:00:36  by Lacretia Nicks RN, Frazier Butt)          Nutrition/Health:        Type of diet: 3 meals/day.  Regular, Caffeine intake amount: 2 cups/day.   (Last Updated: 12/08/2013 13:01:15  by Lacretia Nicks RN, Frazier Butt)          Substance Abuse:  Denies Substance Abuse      (Last Updated: 12/29/2012 09:40:30  by Fulton Reek )         Home/Environment:        Lives with Children, Spouse, 2 grandchildren.   (Last Updated: 12/08/2013 13:01:34  by  Karl Luke)            Advance Directive   Advanced Directives :   No   Burke Keels - 02/05/2017 13:15    CCA Encounter   Onc Type of Patient :   Outpatient Visit Existing   Onc Visit Level, Existing :   Level 1   Ruizmacea1, Dayris - 02/05/2017 13:15

## 2017-02-05 NOTE — Other (Signed)
cc:  Peggye Form M.D.    __________________________________________________________________________    ONCOLOGY CLINIC NOTE  DATE:  02/05/2017    The patient is a 66 year old female for whom we have been following lung nodules  and who has undergone a transthoracic needle biopsy which was negative for  malignancy.  She now returns with a repeat chest CT that shows no change in her  left upper lobe nodule.  Because of this, I would favor a follow-up CAT scan in  12 months' time and I will see her back at that time.  All questions were  answered during our 15-minute visit.    Dictated by:  Alvester Chou, M.D.    D:  02/12/2017 12:19:06  T:  02/12/2017 15:14:39  E:  02/12/2017 15:14:39  CRM/mes  Job# [id number]   SIGNATURE LINE    Electronically signed by Lattie Corns MD, Alcario Drought on 02/12/2017 at 16:59:06 EST

## 2017-02-06 ENCOUNTER — Ambulatory Visit: Admitting: Family Medicine

## 2017-02-26 ENCOUNTER — Ambulatory Visit (HOSPITAL_BASED_OUTPATIENT_CLINIC_OR_DEPARTMENT_OTHER): Admitting: Family Medicine

## 2017-02-26 NOTE — Progress Notes (Signed)
* * *        **  [name]**    ------    68 Y old Female, DOB: Jun 28, 1951    [address] [place], [place] [address]    Home: [phone]    Provider: Alfonso Ramus        * * *    Web Encounter    ---    Answered by    Staff, Clinical    Date: 02/26/2017        Time: 10:11 AM    Caller    [name]    ------            Reason    New Refill Request            Message                      Name of medication: metoprolol 100 mg 1 tab(s)  orally 2 times a day 90 days  #180 with no refill(s)             Comments:                Action Taken    [name],[name] 02/26/2017 11:24:17 AM > cpe 12/14/16  [name] 02/26/2017 11:29:49 AM > Your refill request has been sent.            Refills    Refill metoprolol tablet, 100 mg, orally, 180, 1 tab(s), 2 times a  day, 90 days, Refills=1    ------          * * *        **eMessages**   [name]    ------    Created:    2017-02-26 07:30:38.0    Sent:      Subject:    RE:New Refill Request    Message:                      [name] 02/26/2017 11:29:49 AM > Your refill request has been sent.                        ---          * * *          Patient: [name], [name] DOB: 04-08-1951 [name]  02/26/2017    ---    Note generated by eClinicalWorks EMR/PM Software ([email or web address])

## 2017-03-01 ENCOUNTER — Ambulatory Visit (HOSPITAL_BASED_OUTPATIENT_CLINIC_OR_DEPARTMENT_OTHER): Admitting: Family Medicine

## 2017-03-01 NOTE — Progress Notes (Signed)
* * *        **  Angus Palms L**    ------    7 Y old Female, DOB: [birth year]    [address], [place], [place] [address]    Home: [phone]    Provider: Alfonso Ramus        * * *    Web Encounter    ---    Answered by    Staff, Clinical    Date: 03/01/2017        Time: 04:17 PM    Caller    Danikah Lish    ------            Reason    New Refill Request            Message                      Name of medication: lisinopril 10MG  1 tab(s)  orally once a day   #30 Tablet with no refill(s)             Comments:please put refills on this                Action Taken    Prugh,Melanie 03/01/2017 4:32:47 PM > CPE current, no pending  appts. Jebidiah Baggerly B 03/01/2017 5:38:14 PM > Hi Aubrey, your refill request has  been sent. Please send me a message if you have any questions.            Refills    Refill lisinopril tablet, 10MG , orally, 30 Tablet, 1 tab(s), once  a day, 30 days, Refills=3    ------          * * *        **eMessages**   From:    Pranav Lince    ------    Created:    2017-03-01 13:38:53.0    Sent:      Subject:    RE:New Refill Request    Message:                      Alfonso Ramus 03/01/2017 5:38:14 PM > Hi [name], your refill request has been sent. Please send me a message if you have any questions.                        ---          * * *          Patient: Adams, Kerri L DOB: 05-Oct-1950 Provider: Peggye Form B  03/01/2017    ---    Note generated by eClinicalWorks EMR/PM Software ([email or web address])

## 2017-03-12 ENCOUNTER — Ambulatory Visit (HOSPITAL_BASED_OUTPATIENT_CLINIC_OR_DEPARTMENT_OTHER): Admitting: Family Medicine

## 2017-03-12 NOTE — Progress Notes (Signed)
* * *        **  [name]**    ------    79 Y old Female, DOB: 04-23-1951    [address] [place], [place] [address]    Home: [phone]    Provider: Alfonso Ramus        * * *    Web Encounter    ---    Answered by    Staff, Clinical    Date: 03/12/2017        Time: 04:27 PM    Caller    Iretta Doffing    ------            Reason    New Refill Request            Message                      Name of medication: Citalopram Hydrobromide 10 mg 1 tab(s)  orally once a day   #30 Tablet with no refill(s)             Comments:                Action Taken    Young,[name] 03/12/2017 4:45:07 PM > cpe 12/14/16  Malajah Oceguera B 03/12/2017 6:10:12 PM > Hi Thania, your refill request has been  sent.            Refills    Refill Citalopram Hydrobromide tablet, 10 mg, orally, 30 Tablet, 1  tab(s), once a day, 30 days, Refills=6    ------          * * *        **eMessages**   From:    Shyana Kulakowski    ------    Created:    2017-03-12 14:10:41.0    Sent:      Subject:    RE:New Refill Request    Message:                      Alfonso Ramus 03/12/2017 6:10:12 PM > Hi [name], your refill request has been sent.                        ---          * * *          Patient: [name], [name] DOB: 10/22/1950 Provider: Peggye Form B  03/12/2017    ---    Note generated by eClinicalWorks EMR/PM Software ([email or web address])

## 2017-03-19 ENCOUNTER — Ambulatory Visit (HOSPITAL_BASED_OUTPATIENT_CLINIC_OR_DEPARTMENT_OTHER): Admitting: Family Medicine

## 2017-03-19 NOTE — Progress Notes (Signed)
* * *        **  [name]**    ------    50 Y old Female, DOB: 11-Oct-1950    [address] [place], [place] [address]    Home: [phone]    Provider: Alfonso Ramus        * * *    Web Encounter    ---    Answered by    Staff, Clinical    Date: 03/19/2017        Time: 12:33 PM    Caller    Tacie Mulka    ------            Reason    New Refill Request            Message                      Name of medication: gabapentin 300 mg 1 cap(s)  orally 2 times a day 90 days  #180 Capsule  with 3 refill(s)             Comments:                Action Taken    Young,[name] 03/19/2017 1:58:36 PM > Per [facility] Pat rx last  filled 12/14/16, #180 with 3 refills at cpe appt. Meagan Ancona B 03/19/2017  2:49:39 PM > She should be good then. Young,[name] 03/19/2017 3:08:03 PM >  She would run out this month. Vadim Centola B 03/20/2017 10:24:21 PM > Rx sent  in 11/2016 90 day supply with 3 refills            Refills    Refill gabapentin capsule, 300 mg, orally, 180 Capsule, 1 cap(s),  2 times a day, 90 days, Refills=3    ------          * * *                ---          * * *          Patient: [name], [name] DOB: November 13, 1950 Provider: Peggye Form B  03/19/2017    ---    Note generated by eClinicalWorks EMR/PM Software ([email or web address])

## 2017-04-02 ENCOUNTER — Ambulatory Visit (HOSPITAL_BASED_OUTPATIENT_CLINIC_OR_DEPARTMENT_OTHER): Admitting: Family Medicine

## 2017-04-02 ENCOUNTER — Ambulatory Visit: Admitting: Family Medicine

## 2017-04-02 NOTE — Progress Notes (Signed)
* * *        **  [name], [name]**    ------    13 Y old Female, DOB: 11-21-1950    [address] [place], [place] [address]    Home: [phone]    Provider: Alfonso Ramus        * * *    Web Encounter    ---    Answered by    Staff, Clinical    Date: 04/02/2017        Time: 03:00 PM    Caller    [name]    ------            Reason    New Refill Request            Message                      Name of medication: metformin 1000 mg 1 tab(s)  orally 2 times a day   with no refill(s)glyburide 5 mg 1 tab(s)  orally once a day   with no refill(s)             Comments:                Action Taken    Prugh,Melanie 04/02/2017 3:36:07 PM > CPE current, no FU appts,  no pending appts. [name] 04/04/2017 8:01:59 AM > Hi [name], your refill  request has been sent. You are due for follow up in September please call our  office to make an appointment. Thank you.            Refills    Refill glyburide tablet, 5 mg, orally, 90 Tablet, 1 tab(s), once a  day, 90 days, Refills=0    ------      Refill metformin tablet, 1000MG , orally, 180 Tablet, 1 tab(s), 2 times a  day, 90 days, Refills=0          * * *        **eMessages**   [name]    ------    Created:    2017-04-04 04:03:02.0    Sent:    2017-04-04 04:03:09.0    Subject:    RE:New Refill Request    Message:                      [name] [name] 04/04/2017 8:01:59 AM > Hi [name], your refill request has been sent. You are due for follow up in September please call our office to make an appointment. Thank you.                        ---          * * *          Patient: [name], [name] DOB: 03/22/1951 Provider: Peggye [name] [name]  04/02/2017    ---    Note generated by eClinicalWorks EMR/PM Software ([email or web address])

## 2017-04-05 ENCOUNTER — Ambulatory Visit

## 2017-04-06 ENCOUNTER — Ambulatory Visit (HOSPITAL_BASED_OUTPATIENT_CLINIC_OR_DEPARTMENT_OTHER): Admitting: Family Medicine

## 2017-04-06 NOTE — Progress Notes (Signed)
* * *        **[name], [name]**    ------    66 Y old Female, DOB: May 05, 1951    Account Number: [id number]    [address], [place], [place]    Home: [phone]    Guarantor: Ronney Asters Insurance: [name] [place] (MEDICAID HMO) Payer  ID: [id number]    PCP: Alfonso Ramus External Visit ID: [id number]    Appointment Facility: [facility]        * * *    04/06/2017    Progress Note: [name]    ------    ---        **Current Medications**    ---    Taking      * Freestyle Freedom Lite Lancets as directed 2x a day     ---    * Freestyle Freedom Lite Test Strips as directed 2x a day     ---    * multivitamin Multiple Vitamins capsule 1 cap(s) orally once a day     ---    * Prilosec OTC 20 mg delayed release tablet 1 tab(s) orally once a day     ---    * Lotrisone 0.05%-1% cream 1 app applied topically 2 times a day     ---    * naproxen 500 mg tablet 1 tab(s) orally 2 times a day     ---    * aspirin 81 mg delayed release tablet 1 tab(s) orally once a day     ---    * metformin 1000 mg tablet 1 tab(s) orally 2 times a day     ---    * atorvastatin 40 mg tablet 1 tab(s) orally once a day (at bedtime)     ---    * lisinopril 10 mg tablet 1 tab(s) orally once a day     ---    * metoprolol 100 mg tablet 1 tab(s) orally 2 times a day     ---    * lisinopril 10MG  tablet 1 tab(s) orally once a day     ---    * Citalopram Hydrobromide 10 mg tablet 1 tab(s) orally once a day     ---    * gabapentin 300 mg capsule 1 cap(s) orally 2 times a day     ---    * glyburide 5 mg tablet 1 tab(s) orally once a day     ---    * metformin 1000MG  tablet 1 tab(s) orally 2 times a day     ---    * Medication List reviewed and reconciled with the patient     ---      **Past Medical History**    ---      HTN    ---    high cholesterol-TC-162 HDL-50 LDL-76 TG-182 done 12/30/00    ---    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- Dr Fayrene Fearing Kim.->01/31/16-No DM retinopathy    ---    GERD    ---     BMD-Normal-02/23/14    ---    Monofilament test-11/18/15-Normal    ---    Insure Fit-06/13/15- Negative    ---    Pap-06/13/15 -Negative    ---    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    ---    Cardiac stress test-03/01/15-positive EKG changes but neg for inducible wall  motion abnormalities    ---    Lung nodule- following Dr Lattie Corns seen 01/2015    ---      **  Surgical History**    ---      ectopic pregnancies    ---    left ankle screws 2010    ---      **Family History**    ---      Father: deceased 35 yrs, MI, diagnosed with Hypertension, Heart Disease    ---    Mother: deceased 33 yrs, DMII, cirrhoisis of the liver, diagnosed with  Diabetes    ---    Sibling 1: sister ovarian/uterus CA, diagnosed with Diabetes, Hypertension    ---    Sibling 2: brother - DMII, HTN, diagnosed with Diabetes, Hypertension    ---    4 brother(s) , 2 sister(s) . 2daughter(s) - healthy.    ---      **Social History**    ---    Sexual History  Had sex in the past 12 months (vaginal, oral, or anal)? No  ,  Have you ever had an STD? No.    Smoking  Status: never smoker  , Patient counseled on the dangers of tobacco  use: 04/06/2017.    Seat belt: yes.    no Alcohol.    no Drug use.    Exercise: yes.    Marital Status: married, husband - Antonio.    Children: two Daughters.    Number of household members: 4, husband, daughter, grandson.    Occupation: retired.    no Travel outside [place].    Caffeine: coffee in the am.    Pets: 1 cat, 3 dogs (chihuahuas).    Sexual orientation: heterosexual.    Sexually active: not currently.      **Gyn History**    ---    Periods : no spotting since menopause.    Last pap smear date 2016 no HPV done.    Last mammogram date 2017.    Date of Last Period unsure, somewhere around age [age].    Menarche 13.      **OB History**    ---    Total pregnancies 7\.    Total living children 2\.    Miscarriage(s) 5\.      **Allergies**    ---      N.K.D.A.    ---      **Review of Systems**    ---    See  HPI.        **Reason for Appointment**    ---      1\. Persistent cough    ---      **History of Present Illness**    ---    _Sick Visit_ :    Patient complains of symptoms for Since end of May. Seen in Urgent Care and  told her it might be allergies; nasal spray and medication for cough. Cough  improved for a bit but it returned..    _Constitutional_ :    Denies : MYALGIAS. FEVER. CHILLS.    _ENT_ :    c/o NASAL CONGESTION. c/o RHINITIS.    Denies : HEADACHE. EAR PAIN. EAR FULLNESS. EYE SYMPTOMS. FACIAL PAIN  Or Neck  Stiffness  . POSTNASAL DRIP. TEETH PAIN. SORE THROAT. HOARSENESS. COUGH.  SWOLLEN LYMPH NODES. ALLERGIES.    _Pulmonology_ :    c/o COUGH  dry cough-keeps her up at night. Headaches from coughing  .    Denies : SHORTNESS OF BREATH. WHEEZING. TOBACCO USE.    _Gastroenterology_ :    Denies : NAUSEA. VOMITING. DIARRHEA.      **Vital Signs**    ---  HR 69, BP  146/68  , O2 Sat. 98, Ht 58, Wt 166, Wt Change 1 lb, BMI  34.69  .      **Examination**    ---    _General Examination_ :    General  NAD  ,  pleasant  ,  alert and oriented  ,  well developed and well-  nourished  ,Portugese Speaking.  Marland Kitchen    HEENT:  Head - NC/AT  ,  clear conjunctiva  , Ears:  TM's normal  ,  turbinates normal  Oropharynx:  pharynx and tonsils normal. MMMNasal:  ,  turbinates swollen inflamed. clear rhinorrhea, no purulent nasal discharge.  .    Neck, Thyroid :  supple  ,  no thyromegaly  ,  no lymphadenopathy  .    Heart:  no murmurs  ,  RRR, normal S1S2  .    Lungs:  clear to auscultation bilaterally  ,  no wheezes/rhonchi/rales  .    Peripheral pulses:  normal (2+) bilaterally  .    Skin:  normal  ,  warm, no rash  .    Neurologic Exam:  unremarkable  ,  alert and oriented x 3  ,  CN's II-XII  grossly intact  .          **Assessments**    ---    1\. Adverse effect of angiotensin-converting-enzyme inhibitors, initial  encounter - T46.4X5A (Primary)    ---    2\. Cough - R05, Suspect possible ACE-I induced cough due to  chronicity of  cough, lack of other URI symptoms, and clear lung exam. Nonsmoker. Stop  lisinopril and switch to ARB. F/U within a month if symptoms do not improve.  Other etiologies include PND, RAD, LPR, intrinsic lung disease.    ---      **Treatment**    ---      **1\. Cough**    Stop lisinopril tablet, 10 mg, 1 tab(s), orally, once a day    Stop lisinopril tablet, 10MG , 1 tab(s), orally, once a day    Start losartan tablet, 25 mg, 1 tab(s), orally, once a day, 30 day(s), 30,  Refills 1    Start codeine-guaifenesin syrup, 10 mg-100 mg/5 mL, 10 mL, orally, every 6  hours as needed for cough, 10 days, 200 ml, Refills 0    ---        **2\. Others**    Notes: History and exam is consistent with a viral URI- I encouraged increase  in fluids, acetaminophen (Tylenol) every 6 hours as needed for pain or fever  (no more than 3,000 mg per day). Naproxen (Aleve) 220 mg-440 mg every 12 hours  as needed for pain or fever. Saline nasal washes daily. Guaifenesin (Mucinex)  for congestion.    Return precautions reviewed- RTC if symptoms persist or worsen.      **Follow Up**    ---    F/U any worsening or persistent symptoms.    Electronically signed by [name] , MD on 04/09/2017 at 10:59 AM EDT    Sign off status: Completed        * * [facility]    [address]    [place], [place] [address]    Tel: [phone]    Fax: [phone]              * * *          Patient: [name], [name]  DOB: Jan 02, 1951 Progress Note: [name]  04/06/2017    ---    Note generated by eClinicalWorks EMR/PM Software ([email or web address])

## 2017-05-09 ENCOUNTER — Ambulatory Visit (HOSPITAL_BASED_OUTPATIENT_CLINIC_OR_DEPARTMENT_OTHER): Admitting: Family Medicine

## 2017-05-09 NOTE — Progress Notes (Signed)
* * *        **  [name], [name]**    ------    23 Y old Female, DOB: [birth year]    [address] [place], [place] [address]    Home: [phone]    Provider: Alfonso Ramus        * * *    Web Encounter    ---    Answered by    Staff, Clinical    Date: 05/09/2017        Time: 03:39 PM    Caller    [name]    ------            Reason    New Refill Request            Message                      Name of medication: metformin 1000MG  1 tab(s)  orally 2 times a day 90 days  #180 Tablet with no refill(s)             Comments:                Action Taken    Prugh,Melanie 05/09/2017 3:58:43 PM > CPE current, no FU appts,  no pending appts. [name] 05/10/2017 12:00:38 PM > Hi [name], your  refill request has been sent to your pharmacy.            Refills    Refill metformin tablet, 1000 mg, orally, 180 Tablet, 1 tab(s), 2  times a day, 90 days, Refills=1    ------          * * *        **eMessages**   From:    Benisha Hadaway    ------    Created:    2017-05-10 08:01:16.0    Sent:    2017-05-10 08:01:27.0    Subject:    RE:New Refill Request    Message:                      Alfonso Ramus 05/10/2017 12:00:38 PM > Hi [name], your refill request has been sent to your pharmacy.                        ---          * * *          Patient: [name], [name] DOB: September 04, 1951 [name]  05/09/2017    ---    Note generated by eClinicalWorks EMR/PM Software ([email or web address])

## 2017-06-11 ENCOUNTER — Ambulatory Visit (HOSPITAL_BASED_OUTPATIENT_CLINIC_OR_DEPARTMENT_OTHER): Admitting: Family Medicine

## 2017-06-11 NOTE — Progress Notes (Signed)
* * *        **  LEVEDA, KENDRIX**    ------    54 Y old Female, DOB: 1951-03-29    [address], [place], [place] [address]    Home: [phone]    Provider: Alfonso Ramus        * * *    Telephone Encounter    ---    Answered by    Rubbie Battiest    Date: 06/11/2017        Time: 03:37 PM    Caller    [facility]    ------            Reason    refill Losartan            Message                      cpe 12/14/16                 Action Taken    Sheyenne Konz B 06/12/2017 8:33:02 AM > Rx sent            Refills    Refill losartan tablet, 25 mg, orally, 90, 1 tab(s), once a day,  90 days, Refills=1    ------          * * *                ---          * * *          Patient: Weiss, Rolande L DOB: 10-19-1950 Provider: Peggye Form B  06/11/2017    ---    Note generated by eClinicalWorks EMR/PM Software ([email or web address])

## 2017-06-12 ENCOUNTER — Ambulatory Visit: Admitting: Family Medicine

## 2017-06-16 ENCOUNTER — Ambulatory Visit (HOSPITAL_BASED_OUTPATIENT_CLINIC_OR_DEPARTMENT_OTHER): Admitting: Family Medicine

## 2017-06-16 NOTE — Progress Notes (Signed)
* * *        **  [name], [name]**    ------    22 Y old Female, DOB: 22-Apr-1951    [address] [place], [place] [address]    Home: [phone]    Provider: Alfonso Ramus        * * *    Telephone Encounter    ---    Answered by    Rubbie Battiest    Date: 06/16/2017        Time: 05:19 PM    Caller    [facility]    ------            Reason    refill test strips and lancets: Freestyle            Message                      cpe 12/14/16                Action Taken    Gal Feldhaus B 06/18/2017 9:33:07 PM > Rx sent            Refills    Refill Freestyle Freedom Lite Lancets, 1 box, as directed, 2x a  day, 30 day, Refills=11    ------      Refill Freestyle Freedom Lite Test Strips , 1 box, as directed, 2x a day,  30 days, Refills=11          * * *                ---          * * *          Patient: [name], [name] DOB: 03-14-1951 Provider: Peggye Form B  06/16/2017    ---    Note generated by eClinicalWorks EMR/PM Software ([email or web address])

## 2017-07-06 ENCOUNTER — Ambulatory Visit (HOSPITAL_BASED_OUTPATIENT_CLINIC_OR_DEPARTMENT_OTHER): Admitting: Family Medicine

## 2017-07-06 NOTE — Progress Notes (Signed)
* * *        **  ZERAH, HILYER**    ------    37 Y old Female, DOB: 07-31-1951    [address], [place], [place] [address]    Home: [phone]    Provider: Alfonso Ramus        * * *    Telephone Encounter    ---    Answered by    Rubbie Battiest    Date: 07/06/2017        Time: 11:16 AM    Caller    [facility]    ------            Reason    refill Glyburide            Message                      cpe 12/14/16                Action Taken    Dameer Speiser B 07/09/2017 5:58:39 PM > Rx sent            Refills    Refill glyburide tablet, 5 mg, orally, 90 Tablet, 1 tab(s), once a  day, 90 days, Refills=0    ------          * * *                ---          * * *          Patient: Pontius, Bitha L DOB: 1951/02/24 Provider: Peggye Form B  07/06/2017    ---    Note generated by eClinicalWorks EMR/PM Software ([email or web address])

## 2017-09-02 ENCOUNTER — Ambulatory Visit (HOSPITAL_BASED_OUTPATIENT_CLINIC_OR_DEPARTMENT_OTHER): Admitting: Family Medicine

## 2017-09-02 NOTE — Progress Notes (Signed)
* * *        **  [name], [name]**    ------    63 Y old Female, DOB: Feb 03, 1951    [address] [place], [place] [address]    Home: [phone]    Provider: Alfonso Ramus        * * *    Web Encounter    ---    Answered by    Staff, Clinical    Date: 09/02/2017        Time: 06:58 PM    Caller    [name]    ------            Reason    New Refill Request            Message                      Name of medication: metoprolol 100 mg 1 tab(s)  orally 2 times a day 90 days  #180  with 1 refill(s)             Comments:                Action Taken    [employer] 09/03/2017 7:38:16 AM > cpe 12/14/16.  Sagar Tengan B 09/03/2017 8:50:34 AM > Hi Elsa, Your refill request has been  sent            Refills    Refill metoprolol tablet, 100 mg, orally, 180, 1 tab(s), 2 times a  day, 90 days, Refills=1    ------          * * *        **eMessages**   [name]    ------    Created:    2017-09-03 03:51:05.0    Sent:      Subject:    RE:New Refill Request    Message:                      Alfonso Ramus 09/03/2017 8:50:34 AM > Hi [name], Your refill request has been sent                        ---          * * *          Patient: [name], [name] DOB: Mar 17, 1951 Provider: Peggye Form B  09/02/2017    ---    Note generated by eClinicalWorks EMR/PM Software ([email or web address])

## 2017-09-13 ENCOUNTER — Ambulatory Visit (HOSPITAL_BASED_OUTPATIENT_CLINIC_OR_DEPARTMENT_OTHER): Admitting: Family Medicine

## 2017-09-13 NOTE — Progress Notes (Signed)
* * *        **  [name], [name]**    ------    72 Y old Female, DOB: [birth year]    [address] [place], [place] [address]    Home: [phone]    Provider: Alfonso Ramus        * * *    Web Encounter    ---    Answered by    Staff, Clinical    Date: 09/13/2017        Time: 06:42 PM    Caller    Monzerat Arizpe    ------            Reason    New Refill Request            Message                      Name of medication: atorvastatin 40 mg 1 tab(s)  orally once a day (at bedtime)   #30 Tablet  with 2 refill(s)             Comments:                Action Taken    Young,[name] 09/14/2017 8:11:18 AM > cpe 12/14/16.  Stacy Sailer B 09/16/2017 3:28:39 PM > Rx sent            Refills    Refill atorvastatin tablet, 40 mg, orally, 90, 1 tab(s), once a  day (at bedtime), Refills=1    ------          * * *                ---          * * *          Patient: [name], [name] DOB: December 09, 1950 Provider: Peggye Form B  09/13/2017    ---    Note generated by eClinicalWorks EMR/PM Software ([email or web address])

## 2017-10-16 ENCOUNTER — Ambulatory Visit (HOSPITAL_BASED_OUTPATIENT_CLINIC_OR_DEPARTMENT_OTHER): Admitting: Family Medicine

## 2017-10-16 NOTE — Progress Notes (Signed)
* * *        **  [name], [name]**    ------    5 Y old Female, DOB: April 05, 1951    [address] [place], [place] [address]    Home: [phone]    Provider: Alfonso Ramus        * * *    Web Encounter    ---    Answered by    Staff, Clinical    Date: 10/16/2017        Time: 11:36 AM    Caller    Maleiah Prom    ------            Reason    New Refill Request            Message                      Name of medication: Citalopram Hydrobromide 10 mg 1 tab(s)  orally once a day 30 days  #30 Tablet  with 6 refill(s)             Comments:                Action Taken    Prugh,Melanie 10/16/2017 12:16:57 PM > CPE current, has FU  appt pending for 11/06/17. Abbygail Willhoite B 10/17/2017 6:13:06 PM > Hi Elira, Your  refill request has been sent            Refills    Refill Citalopram Hydrobromide tablet, 10 mg, orally, 30 Tablet, 1  tab(s), once a day, 30 days, Refills=6    ------          * * *        **eMessages**   From:    Geneve Kimpel    ------    Created:    2017-10-17 13:14:09.0    Sent:      Subject:    RE:New Refill Request    Message:                      Alfonso Ramus 10/17/2017 6:13:06 PM > Hi [name], Your refill request has been sent                        ---          * * *          Patient: [name], [name] DOB: 1951/06/21 Provider: Peggye Form B  10/16/2017    ---    Note generated by eClinicalWorks EMR/PM Software ([email or web address])

## 2017-11-07 ENCOUNTER — Ambulatory Visit (HOSPITAL_BASED_OUTPATIENT_CLINIC_OR_DEPARTMENT_OTHER): Admitting: Family Medicine

## 2017-11-07 NOTE — Progress Notes (Signed)
* * *        **[name], [name]**    ------    67 Y old Female, DOB: [birth year]    Account Number: [id number]    [address] [place], [place]    Home: [phone]    Guarantor: [name], Diane L Insurance: Royston Sinner PLAN (MEDICARE REPLACEMENT  Payer ID: [id number]    External Visit ID: [id number]    Appointment Facility: [facility]        * * *    11/07/2017    Progress notes: [name]    ------    ---        **Current Medications**    ---    Taking      * multivitamin Multiple Vitamins capsule 1 cap(s) orally once a day     ---    * Prilosec OTC 20 mg delayed release tablet 1 tab(s) orally once a day     ---    * Lotrisone 0.05%-1% cream 1 app applied topically 2 times a day     ---    * aspirin 81 mg delayed release tablet 1 tab(s) orally once a day     ---    * gabapentin 300 mg capsule 1 cap(s) orally 2 times a day     ---    * metformin 1000MG  tablet 1 tab(s) orally 2 times a day     ---    * losartan 25 mg tablet 1 tab(s) orally once a day     ---    * Freestyle Freedom Lite Lancets as directed 2x a day     ---    * Freestyle Freedom Lite Test Strips as directed 2x a day     ---    * glyburide 5 mg tablet 1 tab(s) orally once a day     ---    * metoprolol 100 mg tablet 1 tab(s) orally 2 times a day     ---    * atorvastatin 40 mg tablet 1 tab(s) orally once a day (at bedtime)     ---    * Citalopram Hydrobromide 10 mg tablet 1 tab(s) orally once a day     ---    Discontinued    * naproxen 500 mg tablet 1 tab(s) orally 2 times a day     ---    * codeine-guaifenesin 10 mg-100 mg/5 mL syrup 10 mL orally every 6 hours as needed for cough     ---    * metformin 1000 mg tablet 1 tab(s) orally 2 times a day     ---    * Medication List reviewed and reconciled with the patient     ---      **Past Medical History**    ---      HTN    ---    high cholesterol-TC-162 HDL-50 LDL-76 TG-182 done 9/52/84    ---    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- Dr Fayrene Fearing Kim.->01/31/16-No DM retinopathy     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 2daughter(s) - healthy.    ---      **Social History**    ---    Sexual History  Had sex in the past 12 months (vaginal, oral, or anal)? No  ,  Have you ever had an STD? No.    Tobacco Use  Status: never smoker  , Patient counseled on the dangers of  tobacco use: 11/07/2017.    Seat belt: yes.    no Alcohol.    no Drug use.    Exercise: yes.    Marital Status: married, husband - Antonio.    Children: two Daughters.    Number of household members: 4, husband, daughter, grandson.    Occupation: retired.    no Travel outside [place].    Caffeine: coffee in the am.    Pets: 1 cat, 3 dogs (chihuahuas).    Sexual orientation: heterosexual.    Sexually active: not currently.      **Gyn History**    ---    Periods : no spotting since menopause.    Last pap smear date 2016 no HPV done.    Last mammogram date 2017.    Date of Last Period unsure, somewhere around age 48-55.    Menarche 13.      **OB History**    ---    Total pregnancies 7\.    Total living children 2\.    Miscarriage(s) 5\.      **Allergies**    ---      Celexa: hallucinations    ---      **Review of Systems**    ---    _CONSTITUTIONAL_ :    no Fever. no Chills.    _OPTHALMOLOGY_ :     no Change in Vision.    _ENDOCRINOLOGY_ :    no Polyuria. no Polydypsia. no Polyphagia.    _RESPIRATORY_ :    no Shortness of Breath. no DOE (dyspnea on exertion). no Persistent Cough.    _CARDIOLOGY_ :    no Chest Pain. no Shortness of Breath. no Palpitations. no Dizziness.    _GASTROENTEROLOGY_ :    no Abdominal Pain. no Nausea. no Vomiting.    _NEUROLOGY_ :    no Headaches.    _DERMATOLOGY_ :    no Rash.            **Reason for Appointment**    ---      1\. Needs pneumonia vaccine    ---    2\. Due for PE next month-lmh    ---      **History of Present Illness**    ---    _PHQ Form_ :    PHQ9 Form Little interest or pleasure in doing things Nearly every day,  Feeling down, depressed, hopeless More than half the days, Trouble falling or  staying asleep, or sleeping to much More than half the days, Feeling tired or  little energy More than  half the days, Poor appetite or overeating Not at all,  Feeling bad about yourself-or that you are a failure or let yourself or your  family down Not at all, Trouble concentrating on things such as reading the  newspaper or watching television Not at all, Moving or speaking so slowly that  other people could have noticed. or the opposite - being so fidgety or  restless that you have been moving around a lot more than usual Not at all,  Thoughts that you were better off dead, or of hurting yourself in someway? Not  at all, Total score 9, Interpretation Mild Depression.    _Diabetes_ :    DIABETES F/U  doing well and without complaints  . HOME GLUCOSE MONITORING  AM  fasting:70-160's  . HYPOGLYCEMIC EPISODES  once in 40's  . COMPLIANCE  with  medications  . HGBA1C  7.2  . LAST EYE EXAM Date 03/14/2017, Copy of Report on  file? Yes.    _Interim History_ :    Is out of citalopram, but had been having visual hallucinations while on it.  Has had none since stopping.    States glyburide causes hypoglycemia, so she is taking half of the 5 mg dose  at this time.    _Health  Screening_ :    Medication Reconciliation Post-Discharge Has the patient recently been  discharged from an inpatient faclity (hospital, skilled nursing home, rehab)  and seen within 30 days? No. Fall Risk Screening Have you fallen in the past  year? No. Diabetes Is the patient's A1C in control (</=9)? Yes. CAD and/or IVD  Is the patient on an aspirin or anti-platelet? Yes. Depression Was the patient  screened for depression in the current year? Yes, PHQ-9 negative for  depression. Breast Cancer Screening Patient screened within 2 years: Yes,  Normal. Colorectal Cancer Screening Patient had: FOBT 12/2016. Influenza  Vaccination (08/01-03/31) Documented for current year(administered in office  or elsewhere) Yes. Pneumonia Vaccination Documented (administered in office or  elsewhere): Yes. BMI Screening (18.5-24.9) Within range: No, Weight Management  Counseling and follow up at next appointment: Discussed diet and exercise plan  to lower BMI. Tobacco Use Was the patient screened for tobacco use (current  year): Tobacco Non-user. Statin Therapy Does the patient have any of the  following: Diabetes (ages 67-75), LDL-C result between 70-189 mg/dl in 1610  9604 or 5409 Yes 68 on 12/15/16, Is the patient on a statin? Yes. Cost of  Medication Cost of medication discussed on: 11/2017.    _*GAD-7_ :    GAD Scale Feeling nervous, anxious, or on edge? Several days: 1, Not being  able to stop or control worrying? Over half the days: 2, Worrying too much  about different things? Over half the days: 2, Trouble relaxing? Several days:  1, Being so restless that it's hard to sit still? Not at all: 0, Becoming  easily annoyed or irritable? Several days: 1, Feeling afraid as if something  awful might happen: Not at all: 0, If you checked off any problems, how  difficult have these made it for you to do your work, take care of things at  home or get along with other people? Somewhat difficult.      **Vital Signs**    ---    HR 64, BP   142/72  , O2 Sat. 99, Ht 58, Wt 167, Wt Change 1 lb, BMI  34.90  .      **Examination**    ---  _General Examination_ :    General  alert and oriented  ,  NAD  .    HEENT:  moist mucous membranes  ,  PERRLA  ,  EOMI  .    Lymph nodes  none  .    Heart:  RRR, normal S1S2  ,  no murmurs  .    Lungs:  clear to auscultation bilaterally  ,  no wheezes/rhonchi/rales  .    Extremities  no edema  ,  normal sensation to monofilament testing bilat  .    Skin:  no rash or skin lesions. Thickening and discoloration of bilateral  great toes.  .          **Assessments**    ---    1\. 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 Adjustment disorder with depressed mood - F43.21    ---    3\. Hypertension - I10    ---    4\. Pulmonary nodule - R91.1    ---    5\. Need for pneumococcal vaccination - Z23    ---    6\. Onychomycosis - B35.1    ---    7\. Need for influenza vaccination - Z23    ---      **Treatment**    ---      **1\. Type 2 diabetes mellitus with diabetic neuropathy, unspecified**    Continue metformin tablet, 1000MG , 1 tab(s), orally, 2 times a day    Stop glyburide tablet, 5 mg, 1 tab(s), orally, once a day    Start glimepiride tablet, 2 mg, 1 tab(s), orally, once a day, 30 day(s), 30,  Refills 1    _LAB: ALT - [facility]_   ALT    40      6-55 - Units/L    ------------      This lab was reviewed by Marlow Hendrie on 12/24/2017 at 22:01 PM EDT    ------    _LAB: AST - [facility]_ AST    22      6-40 - Units/L    ------------      This lab was reviewed by Fintan Grater on 12/24/2017 at 22:01 PM EDT    ------    _LAB: Lipid Panel_ Chol    160      <=200 - mg/dL    ------------    HDL    48      >=40 - mg/dL    ------------    LDL    63      <=130 - mg/dL    ------------    Status    Fasting      \-    ------------    Trig    247    H    <=150 - mg/dL    ------------      This lab was reviewed by Deleon Passe on 01/24/2018 at  17:55 PM EDT    ------    _LAB: Hemoglobin A1C_ 7.2  A1C    7.2        ------------      [name],[name] 11/07/2017 10:06:25 AM > Herschell Virani B 11/11/2017  9:53:52 PM >    ------    _LAB: Microalbumin Urine_ <30- normal  Microalbumin    30        ------------    Creatinine    200        ------------  [name],[name] 11/07/2017 12:21:23 PM >    ------        Notes: Hemoglobin A1C is at goal. Target goal- <7.5. Had hypoglycemic episode  with Glyburide. I will switch to Glimeperide. I advised to take with first  meal of the day. Continue Metformin. Continue ASA and Statin. Has borderline  nephropathy. No retinopathy. Normal monofilament test. Discussed diabetic diet  and regular exercise.        **2\. Adjustment disorder with depressed mood**    Stop Citalopram Hydrobromide tablet, 10 mg, 1 tab(s), orally, once a day    Start Escitalopram Oxalate tablet, 5 mg, 1 tablet for 7 days then 2 tab(s)  thereafter, orally, once a day, 30 day(s), 30, Refills 1    Notes: Has visual hallucinations with Citalopram. Will switch to Lexapro and  see how she does.        **3\. Hypertension**    Continue losartan tablet, 25 mg, 1 tab(s), orally, once a day    Continue metoprolol tablet, 100 mg, 1 tab(s), orally, 2 times a day    _LAB: Basic Metabolic Panel - [facility]_   BUN    16      8-23 - mg/dL    ------------    Calcium Lvl    9.0      8.5-10.5 - mg/dL    ------------    Chloride    105      98-110 - mmol/L    ------------    CO2    26      21-32 - mmol/L    ------------    Creatinine    1.140      0.550-1.300 - mg/dL    ------------    Glucose Lvl    152    H    70-110 - mg/dL    ------------    Potassium Lvl    4.7      3.6-5.2 - mmol/L    ------------    Sodium Lvl    140      136-146 - mmol/L    ------------    Anion Gap    9      3-11 -    ------------      This lab was reviewed by Latania Bascomb on 12/24/2017 at 22:00 PM EDT    ------         Notes: Controlled . Continue current medication.        **4\. Pulmonary nodule**    Notes: Following Dr [facility].        **5\. Onychomycosis**    Start Penlac Nail Lacquer solution, 8%, 1 app, applied topically, once a day,  30 days, 1, Refills 3    Notes: Wants treatment.      **Immunization**    ---      Influenza - Hi-dose : 0.5 mL (Dose No:1) given by Sharyn Dross on  Left Deltoid    ---    PCV 13 : 0.5 mL (Dose No:1) given by Sharyn Dross on Right Deltoid    ---      **Procedure Codes**    ---      [phone] BLOOD DRAWING    ---    83036 GLYCATED HEMOGLOBIN TEST    ---    60454 ASSAY OF URINE CREATININE    ---    82044 MICROALBUMIN, SEMIQUANT    ---    09811 prevnar 13    ---    91478 FLU VACC PRSV FREE INC  ANTIG    ---    [phone] IMMUNIZATION ADMIN    ---    60454 Rudi Coco, EACH ADD    ---      **Follow Up**    ---    CPE    Electronically signed by Peggye Form on 11/23/2017 at 10:30 AM EST    Sign off status: Completed        * * [facility]    [address]    [place], [place] [address]    Tel: [phone]    Fax: [phone]              * * *          Patient: [name], [name] DOB: 1951/08/01 Progress Note: [name]  11/07/2017    ---    Note generated by eClinicalWorks EMR/PM Software ([email or web address])

## 2017-11-07 NOTE — Progress Notes (Signed)
* * *        **  [name], [name]**    ------    53 Y old Female, DOB: 1951-08-07    [address] [place], [place] [address]    Home: [phone]    Provider: Alfonso Ramus        * * *    Telephone Encounter    ---    Answered by    Sharyn Dross    Date: 11/07/2017        Time: 05:21 PM    Caller    [name]- daughter    ------            Reason    Rx not at pharmacy            Message                      Pharmacy only has two of the changes- glyburide and toenail solution. Lexapro needs to be sent to [facility] in [place]            [phone]                Action Taken    Shaunte Weissinger B 11/07/2017 5:42:58 PM > Rx sent                * * *                ---          * * *          Patient: [name], [name] DOB: 06-09-1951 Provider: Peggye Form B  11/07/2017    ---    Note generated by eClinicalWorks EMR/PM Software ([email or web address])

## 2017-11-21 ENCOUNTER — Ambulatory Visit (HOSPITAL_BASED_OUTPATIENT_CLINIC_OR_DEPARTMENT_OTHER): Admitting: Family Medicine

## 2017-11-21 NOTE — Progress Notes (Signed)
* * *        **  [name], [name]**    ------    64 Y old Female, DOB: 20-Oct-1950    [address], [place], [place] [address]    Home: [phone]    Provider: Alfonso Ramus        * * *    Telephone Encounter    ---    Answered by    Sharyn Dross    Date: 11/21/2017        Time: 11:29 AM    Caller    [name]- daughter    ------            Reason    Refills metformin            Message                      metformin            [id number]                Action Taken    [name],[name] 11/21/2017 11:30:10 AM > Last seen  11/07/17. Has f/u schedued 12/17/17. Fredrico Beedle B 11/21/2017 7:22:44 PM > Rx  sent            Refills    Continue metformin tablet, 1000MG , orally, 60 Tablet, 1 tab(s), 2  times a day, 30 days, Refills=6    ------          * * *                ---          * * *          Patient: [name], [name] DOB: 03-Jun-1951 Provider: Peggye Form B  11/21/2017    ---    Note generated by eClinicalWorks EMR/PM Software ([email or web address])

## 2017-12-11 ENCOUNTER — Ambulatory Visit (HOSPITAL_BASED_OUTPATIENT_CLINIC_OR_DEPARTMENT_OTHER): Admitting: Family Medicine

## 2017-12-11 ENCOUNTER — Ambulatory Visit: Admitting: Family Medicine

## 2017-12-11 LAB — HX BASIC METABOLIC PANEL
CASE NUMBER: 2019072001019
HX ANION GAP: 9 — NL (ref 3.0–11.0)
HX BUN: 16 mg/dL — NL (ref 8.0–23.0)
HX CALCIUM LVL: 9 mg/dL — NL (ref 8.5–10.5)
HX CHLORIDE: 105 mmol/L — NL (ref 98.0–110.0)
HX CO2: 26 mmol/L — NL (ref 21.0–32.0)
HX CREATININE: 1.14 mg/dL — NL (ref 0.55–1.3)
HX GLUCOSE LVL: 152 mg/dL — ABNORMAL HIGH (ref 70.0–110.0)
HX POTASSIUM LVL: 4.7 mmol/L — NL (ref 3.6–5.2)
HX SODIUM LVL: 140 mmol/L — NL (ref 136.0–146.0)

## 2017-12-11 LAB — HX LIPID PANEL
CASE NUMBER: 2019072001019
HX CHOL: 160 mg/dL — NL
HX HDL: 48 mg/dL — NL
HX LDL: 63 mg/dL — NL
HX TRIG: 247 mg/dL — ABNORMAL HIGH

## 2017-12-11 LAB — HX GLOMERULAR FILTRATION RATE (ESTIMATED)
CASE NUMBER: 2019072001019
HX AFN AMER GLOMERULAR FILTRATION RATE: 58 mL/min/{1.73_m2}
HX NON-AFN AMER GLOMERULAR FILTRATION RATE: 51 mL/min/{1.73_m2}

## 2017-12-11 LAB — HX AST
CASE NUMBER: 2019072001019
HX AST: 22 U/L — NL (ref 6.0–40.0)

## 2017-12-11 LAB — HX ALT
CASE NUMBER: 2019072001019
HX ALT: 40 U/L — NL (ref 6.0–55.0)

## 2017-12-13 ENCOUNTER — Ambulatory Visit (HOSPITAL_BASED_OUTPATIENT_CLINIC_OR_DEPARTMENT_OTHER): Admitting: Family Medicine

## 2017-12-13 NOTE — Progress Notes (Signed)
* * *        **  [name], [name]**    ------    60 Y old Female, DOB: 1950-10-22    [address] [place], [place] [address]    Home: [phone]    Provider: Alfonso Ramus        * * *    Web Encounter    ---    Answered by    Staff, Clinical    Date: 12/13/2017        Time: 02:00 PM    Caller    Gardenia Byus    ------            Reason    New Refill Request            Message                      ***Start of Medication List: ***       losartan 25 mg 1 tab(s)  orally once a day         with no refill(s)      ***End of Med List ***       Comments:90 day prescription                Action Taken    Prugh,Melanie 12/13/2017 2:02:39 PM > annual Medicare FU appt  pending for 12/17/17. Keighan Amezcua B 12/13/2017 6:08:44 PM > Hi [name], I have  sent your refill requaet. Please check your pharmacy.            Refills    Refill losartan tablet, 25 mg, orally, 90, 1 tab(s), once a day,  90 days, Refills=1    ------          * * *        **eMessages**   From:    Anetria Harwick    ------    Created:    2017-12-13 14:09:46.0    Sent:      Subject:    RE:New Refill Request    Message:                      Alfonso Ramus 12/13/2017 6:08:44 PM > Hi [name], I have sent your refill requaet. Please check your pharmacy.                        ---          * * *          Patient: [name], [name] DOB: 1950-12-06 Provider: Peggye Form B  12/13/2017    ---    Note generated by eClinicalWorks EMR/PM Software ([email or web address])

## 2017-12-17 ENCOUNTER — Ambulatory Visit (HOSPITAL_BASED_OUTPATIENT_CLINIC_OR_DEPARTMENT_OTHER): Admitting: Family Medicine

## 2017-12-17 ENCOUNTER — Ambulatory Visit: Admitting: Family Medicine

## 2017-12-17 NOTE — Progress Notes (Signed)
* * *        **[name], [name]**    ------    67 Y old Female, DOB: 1951/03/15    Account Number: [id number]    [address], [place], [place]    Home: [phone]    Guarantor: Ronney Asters Insurance: Royston Sinner PLAN (MEDICARE REPLACEMENT  Payer ID: [id number]    External Visit ID: [id number]    Appointment Facility: [facility]        * * *    12/17/2017    Progress Notes: Yojan Paskett    ------    ---        **Current Medications**    ---    Taking      * multivitamin Multiple Vitamins capsule 1 cap(s) orally once a day     ---    * Prilosec OTC 20 mg delayed release tablet 1 tab(s) orally once a day     ---    * Lotrisone 0.05%-1% cream 1 app applied topically 2 times a day     ---    * aspirin 81 mg delayed release tablet 1 tab(s) orally once a day     ---    * gabapentin 300 mg capsule 1 cap(s) orally 2 times a day     ---    * Freestyle Freedom Lite Lancets as directed 2x a day     ---    * Freestyle Freedom Lite Test Strips as directed 2x a day     ---    * atorvastatin 40 mg tablet 1 tab(s) orally once a day (at bedtime)     ---    * glimepiride 2 mg tablet 1 tab(s) orally once a day     ---    * Penlac Nail Lacquer 8% solution 1 app applied topically once a day     ---    * metformin 1000MG  tablet 1 tab(s) orally 2 times a day     ---    * metoprolol 100 mg tablet 1 tab(s) orally 2 times a day     ---    * losartan 25 mg tablet 1 tab(s) orally once a day     ---    * escitalopram 10 mg tablet 1 tab(s) orally once a day     ---    Discontinued    * Escitalopram Oxalate 5 mg tablet 1 tablet for 7 days then 2 tab(s) thereafter orally once a day     ---    * Medication List reviewed and reconciled with the patient     ---      **Past Medical History**    ---      HTN    ---    high cholesterol-TC-162 HDL-50 LDL-76 TG-182 done 4/40/34    ---    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- Dr Fayrene Fearing Kim.->01/31/16-No DM retinopathy    ---    GERD    ---    BMD-Normal-02/23/14    ---     Monofilament test-11/18/15-Normal    ---    Insure Fit-06/13/15- Negative    ---    Pap-06/13/15 -Negative    ---    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    ---    Cardiac stress test-03/01/15-positive EKG changes but neg for inducible wall  motion abnormalities    ---    Lung nodule- following Dr Lattie Corns seen 01/2015    ---      **Surgical History**    ---  ectopic pregnancies    ---    left ankle screws 2010    ---      **Family History**    ---      Father: deceased 67 yrs, MI, diagnosed with Hypertension, Heart Disease    ---    Mother: deceased 32 yrs, DMII, cirrhoisis of the liver, diagnosed with  Diabetes    ---    Sibling 1: sister ovarian/uterus CA, diagnosed with Diabetes, Hypertension    ---    Sibling 2: brother - DMII, HTN, diagnosed with Diabetes, Hypertension    ---    4 brother(s) , 2 sister(s) . 2daughter(s) - healthy.    ---      **Social History**    ---    Sexual History  Had sex in the past 12 months (vaginal, oral, or anal)? No  ,  Have you ever had an STD? No.    Alcohol Smart Form  Did you have a drink containing alcohol in the past year?  No  , Points 0  , Interpretation Negative.    Tobacco Use  Status: never smoker  , Patient counseled on the dangers of  tobacco use: 12/17/2017.    Seat belt: yes.    no Alcohol.    no Drug use.    Diet/Nutrition: Three meals a day, Dairy, Fruits & Vegetables, Protein Foods.    no Exercise.    Marital Status: married, husband - Antonio.    no Domestic violence.    Children: two Daughters.    Number of household members: 4, husband, daughter, grandson.    Occupation: retired.    no Travel outside [place].    Caffeine: coffee in the am.    Home smoke detector use: yes.    Pets: 1 cat, 3 dogs (chihuahuas).    Sexual orientation: heterosexual.    Sexually active: not currently.      **Gyn History**    ---    Periods : no spotting since menopause.    Last pap smear date 2016 no HPV done.    Last mammogram date 2017.    Date of Last Period unsure,  somewhere around age 2-55.    Menarche 13.      **OB History**    ---    Total pregnancies 7\.    Total living children 2\.    Miscarriage(s) 5\.      **Allergies**    ---      Celexa: hallucinations    ---    lisinopril: cough    ---      **Hospitalization/Major Diagnostic Procedure**    ---      Denies Past Hospitalization    ---      **Review of Systems**    ---    _CONSTITUTIONAL_ :    Unexplained weight change  none  .    _OPTHALMOLOGY_ :    Glasses or contacts  none  .    _ENT_ :    Hoarseness  none  . Swollen Lymph Nodes  none  . Difficulty swallowing  no  .  Dental care  up-to-date  .    _ALLERGY_ :    Environmental Allergies  none  .    _RESPIRATORY_ :    Shortness of Breath  none  . Persistent Cough  none  .    _CARDIOLOGY_ :    Chest Pain  none  . Palpitations  none  . Leg Edema  none  . Exertional  symptoms  none  .    _GASTROENTEROLOGY_ :    Dysphagia  none  . Abdominal Pain  none  . Constipation  none  . Diarrhea  none  . Blood in Stool  none  . Heartburn  none  .    _FEMALE REPRODUCTIVE_ :    Hot Flashes  none  . Abnormal Vaginal Discharge  none  . Dyspareunia  none  .  Breast lump  none  . Practices SBE  regulary  . Vaginal dryness  none  .    _UROLOGY_ :    Blood in Urine  none  . Urinary Incontinence  none  . Nocturia  None  .  Dysuria  none  .    _MUSCULOSKELETAL_ :    Joint Swelling  none  . Joint Pain  none  . Joint Stiffness  none  .    _NEUROLOGY_ :    Tingling/Numbness  none  . Dizziness  none  . Weakness  none  . Visual Changes  none  . Headaches  none  .    _PSYCHOLOGY_ :    Depression  none  . Sleep Disturbances  none  . Anxiety  none  .    _DERMATOLOGY_ :    New or changing skin lesions  none  .    _HEMATOLOGY/LYMPH_ :    Bleeding tendencies  none  .            **Reason for Appointment**    ---      1\. Medicare annual follow up visit-[facility]    ---      **History of Present Illness**    ---    _PHQ Form_ :    PHQ9 Form Little interest or pleasure in doing things More than half the  days,  Feeling down, depressed, hopeless Several days, Trouble falling or staying  asleep, or sleeping to much Several days, Feeling tired or little energy Not  at all, Poor appetite or overeating Not at all, Feeling bad about yourself-or  that you are a failure or let yourself or your family down Not at all, Trouble  concentrating on things such as reading the newspaper or watching television  Not at all, Moving or speaking so slowly that other people could have noticed.  or the opposite - being so fidgety or restless that you have been moving  around a lot more than usual Not at all, Thoughts that you were better off  dead, or of hurting yourself in someway? Not at all, Total score 4,  Interpretation Minimal Depression.    _Health Screening_ :    Fall Risk Screening Have you fallen in the past year? No, Patient screened for  falls on: 12/17/2017. Diabetes Is the patient's A1C in control (</=9)? Yes.  Depression Was the patient screened for depression in the current year? Yes,  PHQ-9 negative for depression. Breast Cancer Screening Patient screened within  2 years: Yes, Normal 01/14/2017. Colorectal Cancer Screening Patient had: _.  Influenza Vaccination (08/01-03/31) Documented for current year(administered  in office or elsewhere) _. Tobacco Use Was the patient screened for tobacco  use (current year): Tobacco Non-user. Statin Therapy Does the patient have any  of the following: Diabetes (ages 35-75), LDL-C result between 70-189 mg/dl in  1610 9604 or 5409 Yes, Is the patient on a statin? Yes. Cost of Medication  Cost of medication discussed on: 11/2017.    _Female CPE/Screening_ :    PAP/HPV -  Due today  . Mammogram -  Date done - 12/2016  . BMD -  due  .  Colonoscopy -  Refuses  . Immunizations  Up to date  .      **Vital Signs**    ---    HR 57, BP 130/82, O2 Sat. 98, Ht 58, Wt 169, Wt Change 2 lb, BMI  35.32  .      **Examination**    ---    _General Examination_ :    General  well nourished, well-hydrated  female in  NAD  .    HEENT:  TMs and ear canals clear, conjunctiva clear, nares patent, oropharynx  clear with MMM  .    Neck, Thyroid :  supple  ,  no thyromegaly  ,  no lymphadenopathy  ,  no JVD  ,  no carotid bruit  .    Breasts :  no lumps felt on either side  ,  no dimpling  ,  no skin changes  ,  no axillary lymphadenopathy  .    Heart:  RRR  ,  no murmurs  .    Lungs:  clear to auscultation bilaterally  .    Abdomen:  soft, NT/ND, BS present  ,  no hepatosplenomegaly  .    Female Genitourinary:  atrophic external genitalia  ,  vagina - pink moist  mucosa, no lesions or abnormal discharge  ,  cervix- no discharge or lesions  or CMT  ,  uterus - nontender and normal size  ,  adnexa - no masses or  tenderness  .    Extremities  normal ROM  ,  no edema  .    Peripheral pulses:  normal (2+) bilaterally  .    Skin:  no rash or suspicious skin lesions  .    Neurologic Exam:  non-focal exam  .    Rectum  no masses  ,  heme negative  .          **Assessments**    ---    1\. Routine medical exam - Z00.00 (Primary)    ---    2\. Adjustment disorder with depressed mood - F43.21    ---    3\. Type 2 diabetes mellitus - E11.9    ---    4\. GERD (gastroesophageal reflux disease) - K21.9    ---    5\. Type 2 diabetes mellitus with diabetic neuropathy, unspecified - E11.40    ---    6\. Routine gynecological examination - Z01.419    ---    7\. Hypertension - I10    ---    8\. Pulmonary nodule - R91.1    ---    9\. 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 Need for shingles vaccine - Z23    ---      **Treatment**    ---      **1\. Routine medical exam**    Notes: Normal exam. Pap smear done today- if normal no further pap .Due for  mammogram next month Has appt 01/27/18. Declines colonoscopy- she will do  insure fit. Immunizations up-to-date.    ---        **2\. Adjustment disorder with depressed mood**    Continue escitalopram tablet, 10 mg, 1 tab(s), orally, once a day, 90 days,  90, Refills 1     Notes: Stable.        **3\. Type 2 diabetes mellitus**    Continue glimepiride tablet, 2  mg, 1 tab(s), orally, once a day, 90 days, 90,  Refills 1    Notes: Continue current medications. Diabetic diet.        **4\. GERD (gastroesophageal reflux disease)**    Continue Prilosec OTC delayed release tablet, 20 mg, 1 tab(s), orally, once a  day    Notes: Stable. Avoid dietary trigger.        **5\. Type 2 diabetes mellitus with diabetic neuropathy, unspecified**    Continue gabapentin capsule, 300 mg, 1 cap(s), orally, 2 times a day, 90 days,  180, Refills 1        **6\. Routine gynecological examination**    _LAB: Pathology Gyn Request_   Path GYN Request    Test performed at [facility], [address] [place], [place] [address]      \-    ------------      Sharyn Dross 12/17/2017 01:08:31 PM > Source: cervix This lab was  reviewed by Sharyn Dross on 01/09/2018 at 08:57 AM EDT    ------    _LAB: HPV High Risk by TMA_ not detected  HPV Source    Cervical      \-    ------------    HPV High Risk by TMA    Not Detected      \-    ------------      Sharyn Dross 12/17/2017 01:08:31 PM > Source: cervix This lab was  reviewed by Sharyn Dross on 01/09/2018 at 08:58 AM EDT    ------        **7\. Hypertension**    Continue metoprolol tablet, 100 mg, 1 tab(s), orally, 2 times a day    Continue losartan tablet, 25 mg, 1 tab(s), orally, once a day    Notes: Controlled. Continue current medication.        **8\. Pulmonary nodule**    Notes: Was seeing Dr Lattie Corns.        **9\. Menopausal and female climacteric states**    _IMAGING: BD Bone Density DEXA Axial Skeleton e-sch*_     Sorn,Sokha  12/20/2017 5:23:56 PM > Booked w/pt's dtr, [name] by phone. appt for 01-27-18  1:30pm. mammo on same day. faxed order to [facility]. mailed reminder    ------        **10\. Need for shingles vaccine**    Start Shingrix Vaccine solution, 0.5 ml, 1 dose, IM, 0.5 ml, Refills 1  additional dose in 2-6  months      **Preventive Medicine**    ---      Counseling: Vision Care . Dental Care . Diet . Exercise . Calcium and  vitamin D . Sunscreen . Breast self exam .    ---      **Procedure Codes**    ---      U0454 PAP/BREAST EXAM    ---      **Follow Up**    ---    3 Months    Electronically signed by Peggye Form on 01/02/2018 at 10:34 PM EDT    Sign off status: Completed        * * [facility]    [address]    [place], [place] [address]    Tel: [phone]    Fax: [phone]              * * *          Patient: [name], [name] DOB: [birth year] Progress Note: Maha Fischel  12/17/2017    ---  Note generated by eClinicalWorks EMR/PM Software ([email or web address])

## 2017-12-19 LAB — HX HPV HIGH RISK BY TMA
CASE NUMBER: 2019078004471
HX HPV HIGH RISK BY TMA: NOT DETECTED

## 2017-12-25 LAB — HX GYN FINAL REPORT
CASE NUMBER: 0
HX FINAL CYTOLOGIC INTERPRETATION: NEGATIVE

## 2018-01-07 ENCOUNTER — Ambulatory Visit (HOSPITAL_BASED_OUTPATIENT_CLINIC_OR_DEPARTMENT_OTHER): Admitting: Family Medicine

## 2018-01-07 NOTE — Progress Notes (Signed)
* * *        **  [name], [name]**    ------    43 Y old Female, DOB: [birth year]    [address], [place], [place] [address]    Home: [phone]    Provider: Alfonso Ramus        * * *    Web Encounter    ---    Answered by    Staff, Clinical    Date: 01/07/2018        Time: 12:38 PM    Caller    [name]    ------            Reason    New Refill Request            Message                      ***Start of Medication List: ***       glimepiride 2 mg 1 tab(s)  orally once a day 90 days        #90  with 1 refill(s)      ***End of Med List ***       please have more than one refill                Action Taken    [name],[name] 01/07/2018 12:43:24 PM > last FU 12/17/17, next  appt 03/19/18. [name] 01/07/2018 5:50:23 PM > Hi [name], your refill  request has been sent            Refills    Refill glimepiride tablet, 2 mg, orally, 90, 1 tab(s), once a day,  90 days, Refills=1    ------          * * *        **eMessages**   From:    Fritz Cauthon    ------    Created:    2018-01-07 13:51:27.0    Sent:      Subject:    RE:New Refill Request    Message:                      Alfonso Ramus 01/07/2018 5:50:23 PM > Hi [name], your refill request has been sent                        ---          * * *          Patient: [name], [name] DOB: 10/07/1950 Provider: Peggye Form B  01/07/2018    ---    Note generated by eClinicalWorks EMR/PM Software ([email or web address])

## 2018-01-27 ENCOUNTER — Ambulatory Visit: Admitting: Family Medicine

## 2018-01-27 ENCOUNTER — Ambulatory Visit (HOSPITAL_BASED_OUTPATIENT_CLINIC_OR_DEPARTMENT_OTHER): Admitting: Family Medicine

## 2018-02-06 ENCOUNTER — Ambulatory Visit (HOSPITAL_BASED_OUTPATIENT_CLINIC_OR_DEPARTMENT_OTHER): Admitting: Family Medicine

## 2018-02-06 NOTE — Progress Notes (Signed)
* * *        **  [name]**    ------    25 Y old Female, DOB: 09/05/1951    [address] [place], [place] [address]    Home: [phone]    Provider: Alfonso Ramus        * * *    Web Encounter    ---    Answered by    Peggye Form B    Date: 02/06/2018        Time: 08:57 AM    Reason    normal bone density    ------            Action Taken    Tisa Weisel B 02/06/2018 8:57:18 AM > Hi [name] news!  your bone density is normal. Best regards.                * * *        **eMessages**   [name]    ------    Created:    2018-02-06 04:58:04.0    Sent:      Subject:    WG:NFAOZH bone density    Message:                      [name] B 02/06/2018 8:57:18 AM > Hi [name] news!  your bone density is normal. Best regards.                        ---          * * *          Patient: [name], [name] DOB: 09/04/1951 Provider: Peggye Form B  02/06/2018    ---    Note generated by eClinicalWorks EMR/PM Software ([email or web address])

## 2018-02-28 ENCOUNTER — Ambulatory Visit (HOSPITAL_BASED_OUTPATIENT_CLINIC_OR_DEPARTMENT_OTHER): Admitting: Family Medicine

## 2018-02-28 NOTE — Progress Notes (Signed)
* * *        **  [name], [name]**    ------    49 Y old Female, DOB: 1951/01/31    [address] [place], [place] [address]    Home: [phone]    Provider: Alfonso Ramus        * * *    Web Encounter    ---    Answered by    Staff, Clinical    Date: 02/28/2018        Time: 12:39 PM    Caller    Shunta Cantara    ------            Reason    New Refill Request            Message                      ***Start of Medication List: ***       metoprolol 100 mg 1 tab(s)  orally 2 times a day         with no refill(s)      ***End of Med List ***       Comments:90 day supply with refills                Action Taken    [name] 02/28/2018 2:39:32 PM > last CPE 12/14/16,  last FU appt 12/17/17, has FU appt pending for 03/19/18. Young, RN ,[name]  03/04/2018 10:26:08 AM > see second portal request for refill. Jacquel Mccamish B  03/04/2018 1:04:10 PM > Hi Narcisa, your refill request has been sent- emessage  sent            Refills    Refill metoprolol tablet, 100 mg, orally, 180 Tablet, 1 tab(s), 2  times a day, 90 days, Refills=1    ------          * * *        **eMessages**   From:    Olando Willems    ------    Created:    2018-03-04 09:07:16.0    Sent:      Subject:    RE:New Refill Request    Message:                      Alfonso Ramus 03/04/2018 1:04:10 PM > Hi [name], your refill request has been sent                        ---          * * *          Patient: Crotwell, Betzabe L DOB: Jul 27, 1951 Provider: Peggye Form B  02/28/2018    ---    Note generated by eClinicalWorks EMR/PM Software ([email or web address])

## 2018-03-04 ENCOUNTER — Ambulatory Visit (HOSPITAL_BASED_OUTPATIENT_CLINIC_OR_DEPARTMENT_OTHER): Admitting: Family Medicine

## 2018-03-04 NOTE — Progress Notes (Signed)
* * *        **  SHERVON, KERWIN**    ------    79 Y old Female, DOB: [birth year]    [address] [place], [place] [address]    Home: [phone]    Provider: Alfonso Ramus        * * *    Web Encounter    ---    Answered by    Staff, Clinical    Date: 03/04/2018        Time: 10:03 AM    Caller    Deneen Vorce    ------            Reason    New Refill Request            Message                      ***Start of Medication List: ***       metoprolol 100 mg 1 tab(s)  orally 2 times a day         #180 with no refill(s)      ***End of Med List ***       Comments: 90 day supply with refills please. 2nd request                Action Taken    Nance Pew 03/04/2018 10:26:43 AM > Hi [name], the  refill request is with Dr. Dot Been.                * * *        **eMessages**   From:    Gwenevere Abbot    ------    Created:    2018-03-04 06:27:46.0    Sent:      Subject:    RE:New Refill Request    Message:                      Young, RN ,[name] 03/04/2018 10:26:43 AM > Hi [name], the refill request is with Dr. Dot Been.                        ---          * * *          Patient: [name], [name] DOB: 04/30/1951 Provider: Peggye Form B  03/04/2018    ---    Note generated by eClinicalWorks EMR/PM Software ([email or web address])

## 2018-03-20 ENCOUNTER — Ambulatory Visit (HOSPITAL_BASED_OUTPATIENT_CLINIC_OR_DEPARTMENT_OTHER): Admitting: Family Medicine

## 2018-03-20 NOTE — Progress Notes (Signed)
* * *        **  LILIENNE, WEINS**    ------    23 Y old Female, DOB: 1951/07/22    [address], [place], [place] [address]    Home: [phone]    Provider: Alfonso Ramus        * * *    Web Encounter    ---    Answered by    Staff, Clinical    Date: 03/20/2018        Time: 01:55 PM    Caller    Berlin Clinger    ------            Reason    New Refill Request            Message                      ***Start of Medication List: ***       Freestyle Freedom Lite Test Strips   as directed   2x a day 30 days              #1 box  with 11 refill(s)      ***End of Med List ***       Comments:                Action Taken                      Karma Ansley B 03/20/2018 5:56:55 PM > Your refill request has been sent.                Refills    Refill Freestyle Freedom Lite Test Strips , 1 box, as directed, 2x  a day, 30 days, Refills=11    ------          * * *        **eMessages**   From:    Astin Rape    ------    Created:    2018-03-20 17:57:20.0    Sent:      Subject:    RE:New Refill Request    Message:                      Peggye Form B 03/20/2018 5:56:55 PM > Your refill request has been sent.                        ---          * * *          Patient: Midkiff, Nekayla L DOB: 04/27/1951 Provider: Peggye Form B  03/20/2018    ---    Note generated by eClinicalWorks EMR/PM Software ([email or web address])

## 2018-03-25 ENCOUNTER — Ambulatory Visit (HOSPITAL_BASED_OUTPATIENT_CLINIC_OR_DEPARTMENT_OTHER): Admitting: Family Medicine

## 2018-03-25 NOTE — Progress Notes (Signed)
* * *        **Truluck, Kili L**    ------    52 Y old Female, DOB: [birth year]    Account Number: [id number]    [address] [place], [id number]    Home: [phone]    Guarantor: [name], Cleda L Insurance: Royston Sinner PLAN (MEDICARE REPLACEMENT  Payer ID: [id number]    External Visit ID: [id number]    Appointment Facility: [facility]        * * *    03/25/2018    Progress notes: Niyla Marone    ------    ---        **Current Medications**    ---    Taking      * multivitamin Multiple Vitamins capsule 1 cap(s) orally once a day     ---    * Lotrisone 0.05%-1% cream 1 app applied topically 2 times a day     ---    * aspirin 81 mg delayed release tablet 1 tab(s) orally once a day     ---    * Freestyle Freedom Lite Lancets as directed 2x a day     ---    * atorvastatin 40 mg tablet 1 tab(s) orally once a day (at bedtime)     ---    * Penlac Nail Lacquer 8% solution 1 app applied topically once a day     ---    * metformin 1000MG  tablet 1 tab(s) orally 2 times a day     ---    * Shingrix Vaccine 0.5 ml solution 1 dose IM     ---    * gabapentin 300 mg capsule 1 cap(s) orally 2 times a day     ---    * escitalopram 10 mg tablet 1 tab(s) orally once a day     ---    * Prilosec OTC 20 mg delayed release tablet 1 tab(s) orally once a day     ---    * losartan 25 mg tablet 1 tab(s) orally once a day     ---    * glimepiride 2 mg tablet 1 tab(s) orally once a day     ---    * metoprolol 100 mg tablet 1 tab(s) orally 2 times a day     ---    * Freestyle Freedom Lite Test Strips as directed 2x a day     ---    * Medication List reviewed and reconciled with the patient     ---      Past Medical History    ---      HTN.        ---    high cholesterol-TC-162 HDL-50 LDL-76 TG-182 done 1/88/41.        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        ---    GERD.        ---    BMD-Normal-02/23/14.        ---    Monofilament test-11/18/15-Normal.        ---    Insure  Fit-06/13/15- Negative.        ---    Pap-06/13/15 -Negative.        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        ---    Cardiac stress test-03/01/15-positive EKG changes but neg for inducible wall  motion abnormalities.        ---  Lung nodule- following Dr Lattie Corns seen 01/2015.        ---      **Surgical History**    ---      ectopic pregnancies    ---    left ankle screws 2010    ---      **Family History**    ---      Father: deceased 51 yrs, MI, diagnosed with Hypertension, Heart Disease    ---    Mother: deceased 11 yrs, DMII, cirrhoisis of the liver, Diabetes    ---    Sibling 1: sister ovarian/uterus CA, Hypertension, Diabetes    ---    Sibling 2: brother - DMII, HTN, Diabetes, Hypertension    ---    4 brother(s) , 2 sister(s) . 2daughter(s) - healthy.    ---      **Social History**    ---    Sexual History  Had sex in the past 12 months (vaginal, oral, or anal)? No  ,  Have you ever had an STD? No.    Tobacco Use  Status: never smoker  , Patient counseled on the dangers of  tobacco use: 03/25/2018.    no Alcohol.    Alcohol Smart Form  Did you have a drink containing alcohol in the past year?  No  , Points 0  , Interpretation Negative.    Marital Status: married, husband - Antonio.    Children: two Daughters.    Sexually active: not currently.    Home smoke detector use: yes.    Pets: 1 cat, 3 dogs (chihuahuas).    no Drug use.    no Exercise.    Caffeine: coffee in the am.    no Domestic violence.    Diet/Nutrition: Three meals a day, Dairy, Fruits & Vegetables, Protein Foods.    Occupation: retired.    Number of household members: 4, husband, daughter, grandson.    Seat belt: yes.    no Travel outside [place].    Sexual orientation: heterosexual.      **Allergies**    ---      Celexa: hallucinations    ---    lisinopril: cough    ---      **Hospitalization/Major Diagnostic Procedure**    ---      No Hospitalization History.    ---      **Review of Systems**    ---    _CONSTITUTIONAL_  :    no Fever. no Chills.    _OPTHALMOLOGY_ :    no Change in Vision.    _RESPIRATORY_ :    no Shortness of Breath. no DOE (dyspnea on exertion).    _CARDIOLOGY_ :    no Chest Pain. no Shortness of Breath. no Palpitations. no Dizziness.    _GASTROENTEROLOGY_ :    no Abdominal Pain. no Nausea. no Vomiting.    _NEUROLOGY_ :    no Headaches.    _DERMATOLOGY_ :    no Rash.            **Reason for Appointment**    ---      1\. 3 months fu    ---    2\. Pharmacist states they need a faxed Rx for test strips- did not get Rx  that was sent on 03/20/18-lmh    ---      **History of Present Illness**    ---    _SDOH (Adult)_ :    Questionaire This questionnaire helps [place] understand your needs and provide  information on resources available to you. The personal information you share  with [place] is confidential and we will not disclose to a third party without your  consent, unless we are required or authorized to do so by law or other  regulations. I consent, HOUSING: What is your housing situation today? I have  housing, FOOD: Within the past 12 months, the food (you/we) bought just didn't  last and (you/we) didn't have money to get more. Never true, UTILITIES: In the  past 12 months has the electric, gas, oil, or water company threaten to shut  off services in your home? No, TRANSPORTATION: In the last 12 months, have you  missed a medical appointment because you didn't have a way to get there? No,  EMPLOYMENT: Are you currently unemployed or looking for work? No, Patient  screened on: 03/2018.    _Diabetes_ :    Denies : POLYURIA.  Denies : POLYDYPSIA.  Denies : POLYPHAGIA.  Denies :  WEIGHT GAIN.  Denies : WEIGHT LOSS.  Denies : FATIGUE.  Denies : PARASTHESIAS.    DIABETES F/U  stable  .  HOME GLUCOSE MONITORING  not checking  .  HYPOGLYCEMIC EPISODES  none  .  COMPLIANCE  with medications  .  HGBA1C  stable  .  LDL  63  .  MICROALBUMINURIA  normal  .  LAST EYE EXAM Date  03/14/2017, Copy of Report on file? Yes.  FLU SHOT   received  .  PNEUMOVAX  received  .    _Hypertension_ :    Denies : CHEST PAIN.  Denies : DIZZINESS.  Denies : LEG EDEMA.  Denies :  ORTHOPNEA.  Denies : PAROXYSMAL NOCTURNAL DYSPNEA.  Denies : PALPITATIONS.  Denies : SOB.  Denies : SYNCOPE.    HYPERTENSION F/U  doing well and without complaints  .  HOME BP CHECKS  not  checking  .  MED COMPLIANCE  yes  .  MED SIDE EFFECTS  none  .      **Vital Signs**    ---    HR 67, BP  148/70  , Repeat BP 130/68, O2 Sat. 98, Ht 58, Wt 170, Wt Change 1  lb, BMI  35.53  .      **Examination**    ---    _General Examination_ :    General  alert and oriented  ,  NAD  .    HEENT:  moist mucous membranes  ,  PERRLA  ,  EOMI  .    Lymph nodes  none  .    Heart:  RRR, normal S1S2  ,  no murmurs  .    Lungs:  clear to auscultation bilaterally  ,  no wheezes/rhonchi/rales  .    Extremities  no edema  .    Skin:  no rash or skin lesions  .          **Assessments**    ---    1\. 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 Hypertension - I10    ---    3\. Type 2 diabetes mellitus with diabetic nephropathy, without long-term  current use of insulin - E11.21    ---    4\. GERD (gastroesophageal reflux disease) - K21.9    ---    5\. Pulmonary nodule - R91.1    ---      **Treatment**    ---      **  1\. Type 2 diabetes mellitus with diabetic neuropathy, unspecified**    Continue glimepiride tablet, 2 mg, 1 tab(s), orally, once a day    Continue metformin tablet, 1000MG , 1 tab(s), orally, 2 times a day    Notes: Hemoglobin A1C is around goal at 7.5. Goal is </=7.0. No retinopathy. .  Continue current medication. I discussed diabetic diet and importance of  regular exercise.    ---        **2\. Hypertension**    Continue losartan tablet, 25 mg, 1 tab(s), orally, once a day    Continue metoprolol tablet, 100 mg, 1 tab(s), orally, 2 times a day    Notes: Controlled. Continue current medication.        **3\. GERD (gastroesophageal reflux  disease)**    Continue Prilosec OTC delayed release tablet, 20 mg, 1 tab(s), orally, once a  day    Notes: Continue Prilosec.        **4\. Pulmonary nodule**    _IMAGING: CT Thorax C- e-sch*_     Tedd Cottrill B 03/25/2018 10:20:15 AM >  follow up pulmonary nodule Sorn,Sokha 03/25/2018 10:46:51 AM > Auth [id number]  Valid 03-25-18 to 05-24-18. CPT Code 14782 Sorn,Sokha 03/25/2018 10:53:25 AM >  Booked w/dtr, [name], by phone for 04-15-18 2pm @ [facility]. faxed order to [facility].  mailed reminder    ------        Notes:    She was following Dr Lattie Corns but he left [facility]. Due for repeat CT scan. We will  process the order.    .        **Labs**    ------    _Lab: Hemoglobin A1C_    ---      A1C    7.5        ------------        Rolanda Jay, RN ,Vernona Rieger 03/25/2018 10:07:26 AM >    ------      **Procedure Codes**    ---      95621 BLOOD DRAWING    ---    30865 GLYCATED HEMOGLOBIN TEST    ---      **Follow Up**    ---    6 Months    Electronically signed by Peggye Form on 04/07/2018 at 02:35 PM EDT    Sign off status: Completed        * * [facility]    [address] [place], [place] [address]    Tel: [phone]    Fax: [phone]              * * *          Patient: JAIANA, SHEFFER DOB: March 18, 1951 Progress Note: Emmaleigh Longo  03/25/2018    ---    Note generated by eClinicalWorks EMR/PM Software ([email or web address])

## 2018-04-11 ENCOUNTER — Ambulatory Visit (HOSPITAL_BASED_OUTPATIENT_CLINIC_OR_DEPARTMENT_OTHER): Admitting: Family Medicine

## 2018-04-11 NOTE — Progress Notes (Signed)
* * *        **  [name], [name]**    ------    57 Y old Female, DOB: May 12, 1951    [address], [place], [place] [address]    Home: [phone]    Provider: Alfonso Ramus        * * *    Web Encounter    ---    Answered by    Staff, Clinical    Date: 04/11/2018        Time: 04:49 PM    Caller    Teshia Harsha    ------            Reason    New Refill Request            Message                      ***Start of Medication List: ***       atorvastatin 40 mg 1 tab(s)  orally once a day (at bedtime)               #90  with 1 refill(s)      ***End of Med List ***       Comments:                Action Taken                      [name]  04/11/2018 4:51:55 PM > CPE current, recent FU appt, has FU appt pending for 10/03/18.      Hagar Sadiq B 04/11/2018 4:54:03 PM > Hi Ledonna, your refill request has been sent.                Refills    Refill atorvastatin tablet, 40 mg, orally, 90, 1 tab(s), once a  day (at bedtime), 90 days, Refills=1    ------          * * *        **eMessages**   [name]    ------    Created:    2018-04-11 16:54:29.0    Sent:      Subject:    RE:New Refill Request    Message:                      Alfonso Ramus 04/11/2018 4:54:03 PM > Hi [name], your refill request has been sent.                        ---          * * *          Patient: [name], [name] DOB: 1951/09/02 Provider: Peggye Form B  04/11/2018    ---    Note generated by eClinicalWorks EMR/PM Software ([email or web address])

## 2018-04-15 ENCOUNTER — Ambulatory Visit: Admitting: Family Medicine

## 2018-05-23 ENCOUNTER — Ambulatory Visit (HOSPITAL_BASED_OUTPATIENT_CLINIC_OR_DEPARTMENT_OTHER): Admitting: Family Medicine

## 2018-05-23 NOTE — Progress Notes (Signed)
* * *        **[name], [name]**    ------    67 Y old Female, DOB: Jun 05, 1951    Account Number: [id number]    [address] [place], [id number]    Home: [phone]    Guarantor: [name], Lisabeth L Insurance: Royston Sinner PLAN (MEDICARE REPLACEMENT  Payer ID: 0    External Visit ID: [id number]    Appointment Facility: [facility]        * * *    05/23/2018    Progress Note: [name]    ------    ---        **Current Medications**    ---    Taking      * multivitamin Multiple Vitamins capsule 1 cap(s) orally once a day     ---    * Lotrisone 0.05%-1% cream 1 app applied topically 2 times a day     ---    * aspirin 81 mg delayed release tablet 1 tab(s) orally once a day     ---    * Freestyle Freedom Lite Lancets as directed 2x a day     ---    * Penlac Nail Lacquer 8% solution 1 app applied topically once a day     ---    * Shingrix Vaccine 0.5 ml solution 1 dose IM     ---    * gabapentin 300 mg capsule 1 cap(s) orally 2 times a day     ---    * escitalopram 10 mg tablet 1 tab(s) orally once a day     ---    * Freestyle Freedom Lite Test Strips as directed 2x a day     ---    * glimepiride 2 mg tablet 1 tab(s) orally once a day     ---    * metformin 1000MG  tablet 1 tab(s) orally 2 times a day     ---    * losartan 25 mg tablet 1 tab(s) orally once a day     ---    * metoprolol 100 mg tablet 1 tab(s) orally 2 times a day     ---    * Prilosec OTC 20 mg delayed release tablet 1 tab(s) orally once a day     ---    * atorvastatin 40 mg tablet 1 tab(s) orally once a day (at bedtime)     ---    * Medication List reviewed and reconciled with the patient     ---      Past Medical History    ---      HTN.        ---    high cholesterol-TC-162 HDL-50 LDL-76 TG-182 done 5/36/64.        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        ---    GERD.        ---    BMD-Normal-02/23/14.        ---    Monofilament test-11/18/15-Normal.        ---    Insure  Fit-06/13/15- Negative.        ---    Pap-06/13/15 -Negative.        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        ---    Cardiac stress test-03/01/15-positive EKG changes but neg for inducible wall  motion abnormalities.        ---  Lung nodule- following Dr Lattie Corns seen 01/2015.        ---      **Family History**    ---      Father: deceased 66 yrs, MI, diagnosed with Hypertension, Heart Disease    ---    Mother: deceased 66 yrs, DMII, cirrhoisis of the liver, Diabetes    ---    Sibling 1: sister ovarian/uterus CA, Diabetes, Hypertension    ---    Sibling 2: brother - DMII, HTN, Diabetes, Hypertension    ---    4 brother(s) , 2 sister(s) . 2daughter(s) - healthy.    ---      **Social History**    ---    Sexual History  Had sex in the past 12 months (vaginal, oral, or anal)? No  ,  Have you ever had an STD? No.    Tobacco Use  Status: never smoker  , Patient counseled on the dangers of  tobacco use: 03/25/2018.    no Alcohol.    Alcohol Smart Form  Did you have a drink containing alcohol in the past year?  No  , Points 0  , Interpretation Negative.    Marital Status: married, husband - Antonio.    Children: two Daughters.    Sexually active: not currently.    Home smoke detector use: yes.    Pets: 1 cat, 3 dogs (chihuahuas).    no Drug use.    no Exercise.    Caffeine: coffee in the am.    no Domestic violence.    Diet/Nutrition: Three meals a day, Dairy, Fruits & Vegetables, Protein Foods.    Occupation: retired.    Number of household members: 4, husband, daughter, grandson.    Seat belt: yes.    no Travel outside [place].    Sexual orientation: heterosexual.      **Allergies**    ---      Celexa: hallucinations    ---    lisinopril: cough    ---      **Review of Systems**    ---    _CONSTITUTIONAL_ :    no Fever. no Chills. no Sweats.    _OPTHALMOLOGY_ :    no Change in Vision.    _ENT_ :    Nasal congestion yes. no Rhinorrhea. no Sore Throat. no Hoarseness. no Cough.  no Sinus Pain. no Otalgia.  Ringing in Ears yes.    _RESPIRATORY_ :    no Shortness of Breath.    _CARDIOLOGY_ :    no Chest Pain. no Shortness of Breath. no Palpitations. no Dizziness.    _GASTROENTEROLOGY_ :    no Abdominal Pain. no Nausea. no Vomiting.    _DERMATOLOGY_ :    no Rash.            **Reason for Appointment**    ---      1\. Dizziness for 4 days per daughter    ---      **History of Present Illness**    ---    _Neurology_ :    67 year old female presents with c/o DIZZINESS  with movement of head  ,  while getting up from sitting position  ,  sensation of room spinning  ,  sensation of imbalance  ,  intermittent  ,  lasting for seconds  , Ringing in  ears.Began 4 days ago. She reports of cold symptoms 1 week ago.  .    Denies : HEADACHE.  Denies : HEAD INJURY.  Denies :  MEMORY LOSS.  Denies :  MENTAL STATUS CHANGE.  Denies : PARESTHESIAS.  Denies : SEIZURES.  Denies :  SLEEP PROBLEMS.  Denies : SPEECH ABNORMALITY.  Denies : TREMOR.  Denies :  VISUAL CHANGES.  Denies : WEAKNESS.      **Vital Signs**    ---    HR 66, BP 130/60, O2 Sat. 98%, Ht 58, Wt 169.8, Wt Change -.2 lb, BMI  35.48  .      **Examination**    ---    _General Examination_ :    General  alert and oriented  ,  NAD  .    HEENT:  moist mucous membranes  ,  PERRLA  ,  EOMI  ,  clear conjunctiva  ,  TM's clear and flat bilaterally. Fluid level in left ear.  Marland Kitchen    Lymph nodes  none  .    Heart:  RRR, normal S1S2  ,  no murmurs  .    Lungs:  clear to auscultation bilaterally  ,  no wheezes/rhonchi/rales  .    Extremities  no edema  .    Skin:  no rash or skin lesions  .          **Assessments**    ---    1\. Vertigo - R42 (Primary)    ---      **Treatment**    ---      **1\. Vertigo**    Start Meclizine Hydrochloride tablet, 12.5 mg, 1 tab(s), orally, 3 times a day  as needed for vertigo, 15 days, 30, Refills 0    Notes: I think it is due to ear effusion. I advised to take Zyrtec D. Will try  Meclizine for symptomattic relief. It can take time to resolve. If  symptoms  get worse return for reevaluation.    ---      **Follow Up**    ---    prn    Electronically signed by Peggye Form on 06/15/2018 at 09:44 AM EDT    Sign off status: Completed        * * [facility]    [address] [place], [place] [address]    Tel: [phone]    Fax: [phone]              * * *          Patient: [name], [name] DOB: May 23, 1951 Progress Note: [name]  05/23/2018    ---    Note generated by eClinicalWorks EMR/PM Software ([email or web address])

## 2018-06-03 ENCOUNTER — Ambulatory Visit (HOSPITAL_BASED_OUTPATIENT_CLINIC_OR_DEPARTMENT_OTHER): Admitting: Family Medicine

## 2018-06-03 NOTE — Progress Notes (Signed)
* * *        **  IZZABELL, KLASEN**    ------    89 Y old Female, DOB: November 07, 1950    [address] [place], [place] [address]    Home: [phone]    Provider: Alfonso Ramus        * * *    Telephone Encounter    ---    Answered by    Rolanda Jay RN, Vernona Rieger    Date: 06/03/2018        Time: 08:36 AM    Caller    [name]- daughter    ------            Reason    Needs call back from office            Message                      Still having issues with vertigo. Is out of medication.            [phone]                Action Taken                      Rolanda Jay, RN ,Vernona Rieger  06/03/2018 8:48:35 AM > Call to [facility]. lmtcb.       Young, RN ,[name] 06/03/2018 11:05:27 AM > pt coming in for ov, Meclizine rx not helping.  + headacne, no vomiting.       Rolanda Jay, RN ,Vernona Rieger  06/03/2018 11:14:46 AM > Noted. Dr. Dot Been, [place] Childes.      Annebelle Bostic B 06/03/2018 1:33:30 PM > Noted                    * * *                ---          * * *          Patient: Barnhart, Mckaylie L DOB: 08-29-1951 Provider: Peggye Form B  06/03/2018    ---    Note generated by eClinicalWorks EMR/PM Software ([email or web address])

## 2018-06-03 NOTE — Progress Notes (Signed)
* * *        **Elkhatib, Eliyana L**    ------    39 Y old Female, DOB: [birth year]    Account Number: [id number]    [address], [place], [id number]    Home: [phone]    Guarantor: [name], Santanna L Insurance: Royston Sinner PLAN (MEDICARE REPLACEMENT  Payer ID: [id number]    External Visit ID: [id number]    Appointment Facility: [facility]        * * *    06/03/2018    Progress Note: Traylon Schimming    ------    ---        **Current Medications**    ---    Taking      * multivitamin Multiple Vitamins capsule 1 cap(s) orally once a day     ---    * Lotrisone 0.05%-1% cream 1 app applied topically 2 times a day, Notes: prn     ---    * aspirin 81 mg delayed release tablet 1 tab(s) orally once a day     ---    * Freestyle Freedom Lite Lancets as directed 2x a day     ---    * Penlac Nail Lacquer 8% solution 1 app applied topically once a day     ---    * Shingrix Vaccine 0.5 ml solution 1 dose IM     ---    * gabapentin 300 mg capsule 1 cap(s) orally 2 times a day     ---    * escitalopram 10 mg tablet 1 tab(s) orally once a day     ---    * Freestyle Freedom Lite Test Strips as directed 2x a day     ---    * glimepiride 2 mg tablet 1 tab(s) orally once a day     ---    * metformin 1000MG  tablet 1 tab(s) orally 2 times a day     ---    * losartan 25 mg tablet 1 tab(s) orally once a day     ---    * metoprolol 100 mg tablet 1 tab(s) orally 2 times a day     ---    * Prilosec OTC 20 mg delayed release tablet 1 tab(s) orally once a day     ---    * atorvastatin 40 mg tablet 1 tab(s) orally once a day (at bedtime)     ---    * Meclizine Hydrochloride 12.5 mg tablet 1 tab(s) orally 3 times a day as needed for vertigo     ---    * Medication List reviewed and reconciled with the patient     ---      Past Medical History    ---      HTN.        ---    high cholesterol-TC-162 HDL-50 LDL-76 TG-182 done 6/57/84.        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         ---    GERD.        ---    BMD-Normal-02/23/14.        ---    Monofilament test-11/18/15-Normal.        ---    Insure Fit-06/13/15- Negative.        ---    Pap-06/13/15 -Negative.        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        ---  Cardiac stress test-03/01/15-positive EKG changes but neg for inducible wall  motion abnormalities.        ---    Lung nodule- following Dr Lattie Corns seen 01/2015.        ---      **Family History**    ---      Father: deceased 79 yrs, MI, diagnosed with Hypertension, Heart Disease    ---    Mother: deceased 57 yrs, DMII, cirrhoisis of the liver, Diabetes    ---    Sibling 1: sister ovarian/uterus CA, Diabetes, Hypertension    ---    Sibling 2: brother - DMII, HTN, Hypertension, Diabetes    ---    4 brother(s) , 2 sister(s) . 2daughter(s) - healthy.    ---      **Social History**    ---    Sexual History  Had sex in the past 12 months (vaginal, oral, or anal)? No  ,  Have you ever had an STD? No.    Tobacco Use  Status: never smoker  , Patient counseled on the dangers of  tobacco use: 06/03/2018.    no Alcohol.    Alcohol Smart Form  Did you have a drink containing alcohol in the past year?  No  , Points 0  , Interpretation Negative.    Marital Status: married, husband - Antonio.    Children: two Daughters.    Sexually active: not currently.    Home smoke detector use: yes.    Pets: 1 cat, 3 dogs (chihuahuas).    no Drug use.    no Exercise.    Caffeine: coffee in the am.    no Domestic violence.    Diet/Nutrition: Three meals a day, Dairy, Fruits & Vegetables, Protein Foods.    Occupation: retired.    Number of household members: 4, husband, daughter, grandson.    Seat belt: yes.    no Travel outside [place].    Sexual orientation: heterosexual.      **Allergies**    ---      Celexa: hallucinations    ---    lisinopril: cough    ---      **Review of Systems**    ---    _CONSTITUTIONAL_ :    no Fever. no Chills. no Sweats.    _OPTHALMOLOGY_ :    no Change in Vision.     _ENT_ :    no Nasal congestion. no Rhinorrhea. no Sore Throat. no Hoarseness. no Cough.  no Otalgia. Ringing in Ears yes. no Post-nasal Drip.    _RESPIRATORY_ :    no Shortness of Breath.    _CARDIOLOGY_ :    no Chest Pain.    _GASTROENTEROLOGY_ :    no Abdominal Pain. no Nausea. no Vomiting.    _NEUROLOGY_ :    no Headaches.    _DERMATOLOGY_ :    no Rash.            **Reason for Appointment**    ---      1\. persisting dizziness, + HA-lmh    ---      **History of Present Illness**    ---    _Sick_ :    67 year old female presents with dizziness. Precipitated with movement. Pt's  daughter states pt gets headaches on a regular basis. Is out of meclizine.  Feels her ear is clogged. Sometimes with ringing. Had colds 2 weeks ago.    Had headaches since 67 years old.      **  Vital Signs**    ---    HR 63, BP 128/64, O2 Sat. 98, Ht 58, Wt 168, Wt Change -1.8 lb, BMI  35.11  .      **Examination**    ---    _General Examination_ :    General  alert and oriented  ,  NAD  .    HEENT:  moist mucous membranes  ,  PERRLA  ,  EOMI  .    Heart:  RRR, normal S1S2  ,  no murmurs  .    Lungs:  clear to auscultation bilaterally  ,  no wheezes/rhonchi/rales  .    Extremities  no edema  .    Skin:  no rash or skin lesions  .          **Assessments**    ---    1\. Vertigo - R42 (Primary)    ---    2\. Chronic headaches - R51    ---      **Treatment**    ---      **1\. Vertigo**    Notes: Likely due to ear effusion. I advised to take Zyrtec D. Can take up to  few weeks for the fluid to resolve.    ---        **2\. Chronic headaches**    Notes: Intermittentmild not worse than usual.    Electronically signed by Peggye Form on 06/21/2018 at 11:19 PM EDT    Sign off status: Completed        * * [facility]    [address] [place], [place] [address]    Tel: [phone]    Fax: [phone]              * * *          Patient: AUBREI, BOUCHIE DOB: Jun 21, 1951 Progress Note: Reann Dobias  06/03/2018    ---    Note generated by eClinicalWorks EMR/PM Software ([email or web address])

## 2018-06-17 ENCOUNTER — Ambulatory Visit (HOSPITAL_BASED_OUTPATIENT_CLINIC_OR_DEPARTMENT_OTHER): Admitting: Family Medicine

## 2018-06-17 NOTE — Progress Notes (Signed)
* * *        **Ballinger, [name]**    ------    48 Y old Female, DOB: Dec 09, 1950    Account Number: [id number]    [address], [place], [place]    Home: [phone]    Guarantor: [name], Lakeysha [name] Insurance: Royston Sinner PLAN (MEDICARE REPLACEMENT  Payer ID: [id number]    External Visit ID: [id number]    Appointment Facility: [facility]        * * *    06/17/2018    Progress notes: Salvador Bigbee    ------    ---        **Current Medications**    ---    Taking      * multivitamin Multiple Vitamins capsule 1 cap(s) orally once a day     ---    * Lotrisone 0.05%-1% cream 1 app applied topically 2 times a day, Notes: prn     ---    * aspirin 81 mg delayed release tablet 1 tab(s) orally once a day     ---    * Freestyle Freedom Lite Lancets as directed 2x a day     ---    * Penlac Nail Lacquer 8% solution 1 app applied topically once a day     ---    * Shingrix Vaccine 0.5 ml solution 1 dose IM     ---    * gabapentin 300 mg capsule 1 cap(s) orally 2 times a day     ---    * escitalopram 10 mg tablet 1 tab(s) orally once a day     ---    * Freestyle Freedom Lite Test Strips as directed 2x a day     ---    * glimepiride 2 mg tablet 1 tab(s) orally once a day     ---    * metformin 1000MG  tablet 1 tab(s) orally 2 times a day     ---    * losartan 25 mg tablet 1 tab(s) orally once a day     ---    * metoprolol 100 mg tablet 1 tab(s) orally 2 times a day     ---    * Prilosec OTC 20 mg delayed release tablet 1 tab(s) orally once a day     ---    * atorvastatin 40 mg tablet 1 tab(s) orally once a day (at bedtime)     ---    Discontinued    * Meclizine Hydrochloride 12.5 mg tablet 1 tab(s) orally 3 times a day as needed for vertigo     ---    * Medication List reviewed and reconciled with the patient     ---      Past Medical History    ---      HTN.        ---    high cholesterol-TC-162 HDL-50 LDL-76 TG-182 done 8/65/78.        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        ---    GERD.        ---    BMD-Normal-02/23/14.        ---    Monofilament test-11/18/15-Normal.        ---    Insure Fit-06/13/15- Negative.        ---    Pap-06/13/15 -Negative.        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        ---    Cardiac stress test-03/01/15-positive EKG changes but neg for inducible wall  motion abnormalities.        ---    Lung nodule- following Dr Lattie Corns seen 01/2015.        ---      **Family History**    ---      Father: deceased 43 yrs, MI, diagnosed with Hypertension, Heart Disease    ---    Mother: deceased 11 yrs, DMII, cirrhoisis of the liver, Diabetes    ---    Sibling 1: sister ovarian/uterus CA, Hypertension, Diabetes    ---    Sibling 2: brother - DMII, HTN, Diabetes, Hypertension    ---    4 brother(s) , 2 sister(s) . 2daughter(s) - healthy.    ---      **Social History**    ---    Sexual History  Had sex in the past 12 months (vaginal, oral, or anal)? No  ,  Have you ever had an STD? No.    Tobacco Use  Status: never smoker  , Patient counseled on the dangers of  tobacco use: 06/17/2018.    no Alcohol.    Alcohol Smart Form  Did you have a drink containing alcohol in the past year?  No  , Points 0  , Interpretation Negative.    Marital Status: married, husband - Antonio.    Children: two Daughters.    Sexually active: not currently.    Home smoke detector use: yes.    Pets: 1 cat, 3 dogs (chihuahuas).    no Drug use.    no Exercise.    Caffeine: coffee in the am.    no Domestic violence.    Diet/Nutrition: Three meals a day, Dairy, Fruits & Vegetables, Protein Foods.    Occupation: retired.    Number of household members: 4, husband, daughter, grandson.    Seat belt: yes.    no Travel outside [place].    Sexual orientation: heterosexual.      **Allergies**    ---      Celexa: hallucinations    ---    lisinopril: cough    ---      **Review of Systems**    ---    _CONSTITUTIONAL_ :    no Fever. no Chills. no Sweats.    _OPTHALMOLOGY_ :     no Change in Vision.    _ENT_ :    no Nasal congestion. no Rhinorrhea. no Sore Throat. no Hoarseness. no Cough.  no Otalgia. Ringing in Ears yes. no Post-nasal Drip.    _RESPIRATORY_ :    no Shortness of Breath.    _CARDIOLOGY_ :    no Chest Pain.    _GASTROENTEROLOGY_ :    no Abdominal Pain. no Nausea. no Vomiting.    _NEUROLOGY_ :    no Headaches.    _DERMATOLOGY_ :    no Rash.            **Reason for Appointment**    ---      1\. 2wk f/u headaches, fluid in ear and dizziness-lmh    ---      **History of Present Illness**    ---    _Sick_ :    67 year old female presents for f/u of dizziness. Here with daughter who  interpret for her. Precipitated with movement. . Feels her ear is clogged.  Sometimes with ringing. Had colds 3 weeks  ago. On and off nasal congestion and  stuffy nose.    Had headaches since 67 years old. Not worsening. intermittent. No headache  currently.      **Vital Signs**    ---    HR 61, BP 124/62, O2 Sat. 98, Ht 58.      **Examination**    ---    _General Examination_ :    General  alert and oriented  ,  NAD  .    HEENT:  moist mucous membranes  ,  PERRLA  ,  EOMI, Fluid level in both TM no  erythema.  Marland Kitchen    Heart:  RRR, normal S1S2  ,  no murmurs  .    Lungs:  clear to auscultation bilaterally  ,  no wheezes/rhonchi/rales  .    Extremities  no edema  .    Skin:  no rash or skin lesions  .          **Assessments**    ---    1\. Vertigo - R42 (Primary)    ---    2\. Hypertension - I10    ---      **Treatment**    ---      **1\. Vertigo**    Notes: Likely due to ear effusion which could be related to allergies or cold  3 weeks ago. I advised to take Zyrtec D. Flonase. It can take sometime to  resolve. If still not better in 2 weeks I will refer her to ENT.    ---        **2\. Hypertension**    Continue losartan tablet, 25 mg, 1 tab(s), orally, once a day, 90 days, 90,  Refills 1    Notes:    Controlled.    .      **Follow Up**    ---    prn, as scheduled    Electronically signed by  [facility] Alis Sawchuk on 07/03/2018 at 10:10 PM EDT    Sign off status: Completed        * * [facility]    [address] [place], [place] [address]    Tel: [phone]    Fax: [phone]              * * *          Patient: [name], [name] DOB: 11/07/1950 Progress Note: Chrystel Barefield  06/17/2018    ---    Note generated by eClinicalWorks EMR/PM Software ([email or web address])

## 2018-06-27 ENCOUNTER — Ambulatory Visit (HOSPITAL_BASED_OUTPATIENT_CLINIC_OR_DEPARTMENT_OTHER): Admitting: Family

## 2018-06-27 NOTE — Progress Notes (Signed)
* * *        **  ANNALESE, STINER**    ------    23 Y old Female, DOB: 19-May-1951    [address], [place], [place] [address]    Home: [phone]    Provider: Mirna Mires        * * *    Web Encounter    ---    Answered by    Staff, Clinical    Date: 06/27/2018        Time: 01:47 PM    Caller    Camielle Oviatt    ------            Reason    New Refill Request            Message                      ***Start of Medication List: ***       escitalopram 10 mg 1 tab(s)  orally once a day 90 days              #90  with 1 refill(s)      ***End of Med List ***       Comments:                Action Taken                      [name]  06/27/2018 2:54:21 PM > recent FU appt, has FU appt pending for 10/03/18.  Can I send to you to refill, thanks.            Carold Eisner  06/27/2018 5:22:00 PM > rx sent                Refills    Refill escitalopram tablet, 10 mg, orally, 90, 1 tab(s), once a  day, 90 days, Refills=1    ------          * * *                ---          * * *          Patient: Linders, Zaniya L DOB: [birth year] Provider: Mirna Mires  06/27/2018    ---    Note generated by eClinicalWorks EMR/PM Software ([email or web address])

## 2018-07-07 ENCOUNTER — Ambulatory Visit (HOSPITAL_BASED_OUTPATIENT_CLINIC_OR_DEPARTMENT_OTHER): Admitting: Family Medicine

## 2018-07-07 NOTE — Progress Notes (Signed)
* * *        **  [name], [name]**    ------    48 Y old Female, DOB: 03-18-1951    [address] [place], [place] [address]    Home: [phone]    Provider: Alfonso Ramus        * * *    Web Encounter    ---    Answered by    Staff, Clinical    Date: 07/07/2018        Time: 08:04 PM    Caller    Rosia Christoffel    ------            Reason    New Refill Request            Message                      ***Start of Medication List: ***       glimepiride 2 mg 1 tab(s)  orally once a day               with no refill(s)      ***End of Med List ***       Comments:                Action Taken                      Young, RN ,[name] 07/08/2018 7:04:06 AM > last FU 06/17/18, next appt on 10/03/18.       Edgardo Petrenko B 07/08/2018 5:23:45 PM > Rx sent      Sayeed Weatherall B 07/08/2018 5:24:54 PM > Hi [name], your refill request has been sent to your pharmacy.                Refills    Refill glimepiride tablet, 2 mg, orally, 90, 1 tab(s), once a day,  90 days, Refills=1    ------          * * *        **eMessages**   From:    Alexah Kivett    ------    Created:    2018-07-08 17:25:36.0    Sent:      Subject:    RE:New Refill Request    Message:                      Alfonso Ramus 07/08/2018 5:24:54 PM > Hi [name], your refill request has been sent to your pharmacy.                        ---          * * *          Patient: [name], [name] DOB: [birth year] Provider: Peggye Form B  07/07/2018    ---    Note generated by eClinicalWorks EMR/PM Software ([email or web address])

## 2018-08-22 ENCOUNTER — Ambulatory Visit (HOSPITAL_BASED_OUTPATIENT_CLINIC_OR_DEPARTMENT_OTHER): Admitting: Family Medicine

## 2018-08-22 NOTE — Progress Notes (Signed)
* * *        **  [name], [name]**    ------    28 Y old Female, DOB: [birth year]    [address] [place], [place] [address]    Home: [phone]    Provider: Alfonso Ramus        * * *    Web Encounter    ---    Answered by    Staff, Clinical    Date: 08/22/2018        Time: 06:21 PM    Caller    Gyneth Klebba    ------            Reason    New Refill Request -            Message                      ***Start of Medication List: ***       gabapentin 300 mg 1 cap(s)  orally 2 times a day 90 days  #180  with 1 refill(s)      ***End of Med List ***       Comments:                Action Taken                      Lashia Niese B 08/25/2018 7:41:13 AM > Hi Mishelle, your refill request has been sent.                Refills    Refill gabapentin capsule, 300 mg, orally, 180, 1 cap(s), 2 times  a day, 90 days, Refills=1    ------          * * *        **eMessages**   From:    Malakai Schoenherr    ------    Created:    2018-08-25 07:42:07.0    Sent:      Subject:    RE:New Refill Request -    Message:                      Peggye Form B 08/25/2018 7:41:13 AM > Hi [name], your refill request has been sent.                        ---          * * *          Patient: [name], [name] DOB: 07-Jul-1951 Provider: Peggye Form B  08/22/2018    ---    Note generated by eClinicalWorks EMR/PM Software ([email or web address])

## 2018-08-26 ENCOUNTER — Ambulatory Visit (HOSPITAL_BASED_OUTPATIENT_CLINIC_OR_DEPARTMENT_OTHER): Admitting: Family Medicine

## 2018-08-26 NOTE — Progress Notes (Signed)
* * *        **  HOUDA, BRAU**    ------    34 Y old Female, DOB: 1951-01-10    [address], [place], [place] [address]    Home: [phone]    Provider: Alfonso Ramus        * * *    Web Encounter    ---    Answered by    Staff, Clinical    Date: 08/26/2018        Time: 07:27 PM    Caller    Presley Blankenburg    ------            Reason    New Refill Request            Message                      ***Start of Medication List: ***       metformin 1000MG  1 tab(s)  orally 2 times a day   with no refill(s)      ***End of Med List ***       Comments:                Action Taken                      Maple Hudson, RN ,[name] 08/27/2018 7:17:26 AM > last FU 06/17/18, next appt 10/03/18.      Braiden Rodman B 08/27/2018 8:13:20 AM > Hi Shareece, your refill request has been sent.                Refills    Refill metformin tablet, 1000MG , orally, 180, 1 tab(s), 2 times a  day, 90 days, Refills=1    ------          * * *        **eMessages**   From:    Gladyce Mcray    ------    Created:    2018-08-27 08:15:08.0    Sent:      Subject:    RE:New Refill Request    Message:                      Alfonso Ramus 08/27/2018 8:13:20 AM > Hi [name], your refill request has been sent.                        ---          * * *          Patient: Furr, Shalina L DOB: [birth year] Provider: Peggye Form B  08/26/2018    ---    Note generated by eClinicalWorks EMR/PM Software ([email or web address])

## 2018-09-01 ENCOUNTER — Ambulatory Visit (HOSPITAL_BASED_OUTPATIENT_CLINIC_OR_DEPARTMENT_OTHER): Admitting: Family Medicine

## 2018-09-01 NOTE — Progress Notes (Signed)
* * *        **  [name], [name]**    ------    25 Y old Female, DOB: Jun 07, 1951    [address] [place], [place] [address]    Home: [phone]    Provider: Alfonso Ramus        * * *    Web Encounter    ---    Answered by    Staff, Clinical    Date: 09/01/2018        Time: 05:09 PM    Caller    [name]    ------            Reason    New Refill Request            Message                      ***Start of Medication List: ***       metoprolol 100 mg 1 tab(s)  orally 2 times a day   with no refill(s)      ***End of Med List ***       Comments:                Action Taken                      [name]  09/01/2018 5:25:57 PM > last CPE 12/14/2016, last FU appt 06/17/18, has FU appt pending for 10/03/17.      Deya Bigos B 09/02/2018 11:04:52 AM > Hi Allianna, your refill request has been sent                Refills    Refill metoprolol tablet, 100 mg, orally, 60 Tablet, 1 tab(s), 2  times a day, 30 days, Refills=0    ------          * * *        **eMessages**   From:    Daquann Merriott    ------    Created:    2018-09-02 11:05:43.0    Sent:      Subject:    RE:New Refill Request    Message:                      Alfonso Ramus 09/02/2018 11:04:52 AM > Hi [name], your refill request has been sent                        ---          * * *          Patient: [name], [name] DOB: 07/02/1951 Provider: Peggye Form B  09/01/2018    ---    Note generated by eClinicalWorks EMR/PM Software ([email or web address])

## 2018-09-13 ENCOUNTER — Ambulatory Visit (HOSPITAL_BASED_OUTPATIENT_CLINIC_OR_DEPARTMENT_OTHER): Admitting: Family Medicine

## 2018-09-13 NOTE — Progress Notes (Signed)
* * *        **[name]**    ------    25 Y old Female, DOB: 10/24/1950    Account Number: [id number]    [name], [place], [id number]    Home: [phone]    Guarantor: [name] Insurance: [name] PLAN (MEDICARE REPLACEMENT  Payer ID: [id number]    PCP: [name] External Visit ID: [id number]    Appointment Facility: [facility]        * * *    09/13/2018    Progress Note: [name] Sites, MD    ------    ---        **Current Medications**    ---    Taking      * multivitamin Multiple Vitamins capsule 1 cap(s) orally once a day     ---    * Lotrisone 0.05%-1% cream 1 app applied topically 2 times a day, Notes: prn     ---    * aspirin 81 mg delayed release tablet 1 tab(s) orally once a day     ---    * Freestyle Freedom Lite Lancets as directed 2x a day     ---    * Penlac Nail Lacquer 8% solution 1 app applied topically once a day     ---    * Shingrix Vaccine 0.5 ml solution 1 dose IM     ---    * Freestyle Freedom Lite Test Strips as directed 2x a day     ---    * Prilosec OTC 20 mg delayed release tablet 1 tab(s) orally once a day     ---    * atorvastatin 40 mg tablet 1 tab(s) orally once a day (at bedtime)     ---    * losartan 25 mg tablet 1 tab(s) orally once a day     ---    * escitalopram 10 mg tablet 1 tab(s) orally once a day     ---    * glimepiride 2 mg tablet 1 tab(s) orally once a day     ---    * gabapentin 300 mg capsule 1 cap(s) orally 2 times a day     ---    * metformin 1000MG  tablet 1 tab(s) orally 2 times a day     ---    * metoprolol 100 mg tablet 1 tab(s) orally 2 times a day     ---    * Medication List reviewed and reconciled with the patient     ---      Past Medical History    ---      HTN.        ---    high cholesterol-TC-162 HDL-50 LDL-76 TG-182 done 7/62/83.        ---    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- [name].->01/31/16-No DM  retinopathy.        ---    GERD.        ---    BMD-Normal-02/23/14.        ---    Monofilament  test-11/18/15-Normal.        ---    Insure Fit-06/13/15- Negative.        ---    Pap-06/13/15 -Negative.        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        ---    Cardiac stress test-03/01/15-positive EKG changes but neg for inducible  wall  motion abnormalities.        ---    Lung nodule- following [name] seen 01/2015.        ---      **Surgical History**    ---      ectopic pregnancies    ---    left ankle screws 2010    ---      **Family History**    ---      Father: deceased 36 yrs, MI, diagnosed with Hypertension, Heart Disease    ---    Mother: deceased 50 yrs, DMII, cirrhoisis of the liver, Diabetes    ---    Sibling 1: sister ovarian/uterus CA, Diabetes, Hypertension    ---    Sibling 2: brother - DMII, HTN, Hypertension, Diabetes    ---    4 brother(s) , 2 sister(s) . 2daughter(s) - healthy.    ---      **Social History**    ---    Sexual History  Had sex in the past 12 months (vaginal, oral, or anal)? No  ,  Have you ever had an STD? No.    Tobacco Use  Status: never smoker  , Patient counseled on the dangers of  tobacco use: 06/17/2018.    no Alcohol.    Alcohol Smart Form  Did you have a drink containing alcohol in the past year?  No  , Points 0  , Interpretation Negative.    Marital Status: married, husband - [name].    Children: two Daughters.    Sexually active: not currently.    Home smoke detector use: yes.    Pets: 1 cat, 3 dogs (chihuahuas).    no Drug use.    no Exercise.    Caffeine: coffee in the am.    no Domestic violence.    Diet/Nutrition: Three meals a day, Dairy, Fruits & Vegetables, Protein Foods.    Occupation: retired.    Number of household members: 4, husband, daughter, grandson.    Seat belt: yes.    no Travel outside [place].    Sexual orientation: heterosexual.      **Allergies**    ---      Celexa: hallucinations    ---    lisinopril: cough    ---      **Review of Systems**    ---    As per HPI.        **Reason for Appointment**    ---      1\. Bilateral  ear pain/dizziness for a week per pt's daughter    ---      **History of Present Illness**    ---    _Interim History_ :    67 yo female her with daughter [name] to translate Portugese. bilateral ear  pain, dizziness x 1 week. Denies any other symptoms. low grade fever today in  office. dizziness is worse with activity and bending over. dizziness if worse  with turning head to both sides. Ringing in both ears since symptoms started.  No decreased hearing. Symptoms are worse morning and night. No sore  throatheadahces, fevers, chills. Some sinus pressure/congestion. No OTC  medications. some headaches but none today.    Has had several of these episodes earlier this year. Meclizine doesn't work.      **Vital Signs**    ---    HR 78, BP  140/80  , Temp 99.0, O2 Sat. 98, Ht 58, Wt 172, Wt Change 4 lb, BMI  35.94  .      **  Examination**    ---    Denton Meek Examination_ :    General  alert and oriented  ,  well nourished and hydrated  ,  appropriate  attire and affect  .    HEENT:  clear conjunctiva  ,  TM's clear and but slightly bulging R>[name]  ,  oropharynx clear with MMM  .    Oral cavity:  clear  ,  moist mucus membranes  .    Neck, Thyroid :  supple  ,  no thyromegaly  ,  no lymphadenopathy  .    Chest:  normal shape and expansion  .    Heart:  RRR  ,  no murmurs  .    Lungs:  clear to auscultation bilaterally  ,  no wheezes/rhonchi/rales  .    Extremities  no clubbing, no edema  .    Peripheral pulses:  normal (2+) bilaterally  .    Skin:  moist, warm  .    Neurologic Exam:  alert and oriented x 3  ,  CN's II-XII grossly intact  ,  DTRs- 2+ bilaterally and symmetric. Dix hallpike mild dizziness on right but  no nystagmus, more dizziness on left but no nystagmus.  Marland Kitchen    Psych  normal affect and mood  .          **Assessments**    ---    1\. Dizziness - R42 (Primary)    ---    2\. Otalgia of both ears - H92.03    ---      **Treatment**    ---      **1\. Dizziness**    Referral To:Otology, Laryngology, Rhinology     Reason:67 yo [place] speaking female with recurrent episodes of otalgia,  headaches, tinnitus, and vertigo symptoms. Dix-hallpike negative.        ---        **2\. Otalgia of both ears**    Referral To:Otology, Laryngology, Rhinology    Reason:67 yo [place] speaking female with recurrent episodes of otalgia,  headaches, tinnitus, and vertigo symptoms. Dix-hallpike negative.        Electronically signed by [name] , MD on 09/13/2018 at 01:38 PM EST    Sign off status: Completed        * * [facility]    [name] [place], [place] [address]    Tel: [phone]    Fax: [phone]              * * *          Patient: [name], [name] DOB: September 23, 1951 Progress Note: [name] Sites, MD  09/13/2018    ---    Note generated by eClinicalWorks EMR/PM Software ([email or web address])

## 2018-10-03 ENCOUNTER — Ambulatory Visit (HOSPITAL_BASED_OUTPATIENT_CLINIC_OR_DEPARTMENT_OTHER): Admitting: Family Medicine

## 2018-10-03 NOTE — Progress Notes (Signed)
* * *        **[name], [name]**    ------    53 Y old Female, DOB: March 02, 1951    Account Number: [id number]    [address] [place], [id number]    Home: [phone]    Guarantor: [name], [place] [name] Insurance: Royston Sinner PLAN (MEDICARE REPLACEMENT  Payer ID: [id number]    External Visit ID: [id number]    Appointment Facility: [facility]        * * *    10/03/2018    Progress notes: Karron Goens    ------    ---        **Current Medications**    ---    Taking      * multivitamin Multiple Vitamins capsule 1 cap(s) orally once a day     ---    * Lotrisone 0.05%-1% cream 1 app applied topically 2 times a day, Notes: prn     ---    * aspirin 81 mg delayed release tablet 1 tab(s) orally once a day     ---    * Freestyle Freedom Lite Lancets as directed 2x a day     ---    * Penlac Nail Lacquer 8% solution 1 app applied topically once a day     ---    * Shingrix Vaccine 0.5 ml solution 1 dose IM     ---    * Freestyle Freedom Lite Test Strips as directed 2x a day     ---    * Prilosec OTC 20 mg delayed release tablet 1 tab(s) orally once a day     ---    * atorvastatin 40 mg tablet 1 tab(s) orally once a day (at bedtime)     ---    * losartan 25 mg tablet 1 tab(s) orally once a day     ---    * escitalopram 10 mg tablet 1 tab(s) orally once a day     ---    * glimepiride 2 mg tablet 1 tab(s) orally once a day     ---    * gabapentin 300 mg capsule 1 cap(s) orally 2 times a day     ---    * metformin 1000MG  tablet 1 tab(s) orally 2 times a day     ---    * metoprolol 100 mg tablet 1 tab(s) orally 2 times a day     ---    * Medication List reviewed and reconciled with the patient     ---      Past Medical History    ---      HTN.        ---    high cholesterol-TC-162 HDL-50 LDL-76 TG-182 done 3/77/93.        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        ---    GERD.        ---    BMD-Normal-02/23/14.        ---    Monofilament test-11/18/15-Normal.         ---    Insure Fit-06/13/15- Negative.        ---    Pap-06/13/15 -Negative.        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        ---    Cardiac stress test-03/01/15-positive EKG changes but neg for inducible wall  motion abnormalities.        ---  Lung nodule- following Dr Lattie Corns seen 01/2015.        ---      **Surgical History**    ---      ectopic pregnancies    ---    left ankle screws 2010    ---      **Family History**    ---      Father: deceased 17 yrs, MI, diagnosed with Heart Disease, Hypertension    ---    Mother: deceased 63 yrs, DMII, cirrhoisis of the liver, Diabetes    ---    Sibling 1: sister ovarian/uterus CA, Diabetes, Hypertension    ---    Sibling 2: brother - DMII, HTN, Diabetes, Hypertension    ---    4 brother(s) , 2 sister(s) . 2daughter(s) - healthy.    ---      **Social History**    ---    Sexual History  Had sex in the past 12 months (vaginal, oral, or anal)? No  ,  Have you ever had an STD? No.    Tobacco Use  Status: never smoker  , Patient counseled on the dangers of  tobacco use: 10/03/2018.    no Alcohol.    Alcohol Smart Form  Did you have a drink containing alcohol in the past year?  No  , Points 0  , Interpretation Negative.    Marital Status: married, husband - Antonio.    Children: two Daughters.    Sexually active: not currently.    Home smoke detector use: yes.    Pets: 1 cat, 3 dogs (chihuahuas).    no Drug use.    no Exercise.    Caffeine: coffee in the am.    no Domestic violence.    Diet/Nutrition: Three meals a day, Dairy, Fruits & Vegetables, Protein Foods.    Occupation: retired.    Number of household members: 4, husband, daughter, grandson.    Seat belt: yes.    no Travel outside [place].    Sexual orientation: heterosexual.      **Allergies**    ---      Celexa: hallucinations    ---    lisinopril: cough    ---      **Hospitalization/Major Diagnostic Procedure**    ---      No Hospitalization History.    ---      **Review of Systems**    ---     _CONSTITUTIONAL_ :    no Fever. no Chills. no Sweats.    _OPTHALMOLOGY_ :    no Change in Vision.    _RESPIRATORY_ :    no Shortness of Breath. no DOE (dyspnea on exertion). no Persistent Cough.    _CARDIOLOGY_ :    no Chest Pain. no Shortness of Breath. no Palpitations. no Dizziness. no Leg  Edema.    _GASTROENTEROLOGY_ :    no Abdominal Pain. no Nausea. no Vomiting.    _NEUROLOGY_ :    no Headaches.    _DERMATOLOGY_ :    no Rash.            **Reason for Appointment**    ---      1\. 6 month fu appt    ---    2\. Has eye exam 10/28/2018. Did not have one in 2019 d/t doctor cancellation-  lmh    ---      **History of Present Illness**    ---    _Diabetes_ :    68 year old female presents with c/o  DIABETES F/U  at patient's baseline  .    HYPOGLYCEMIC EPISODES  none  . COMPLIANCE  with medications  . HGBA1C  7.5 on  03/25/2018  . LDL  63 on 12/11/2017  . MICROALBUMINURIA  normal 11/07/2017  .  LAST EYE EXAM Date has apptt 1/28. EYE DOCTOR  Dr Selena Batten  . FLU SHOT  received  .  PNEUMOVAX  received  .    _Hypertension_ :    c/o HYPERTENSION F/U  doing well and without complaints  . c/o HOME BP CHECKS  occassionally  . c/o MED COMPLIANCE  yes  .    Denies : MED SIDE EFFECTS. Denies : CHEST PAIN. Denies : DIZZINESS. Denies :  LEG EDEMA. Denies : ORTHOPNEA. Denies : PALPITATIONS. Denies : SOB. Denies :  SYNCOPE.    _Interim History_ :    Needs refills of metoprolol and atorvastatin.      **Vital Signs**    ---    HR 61, BP 132/72, O2 Sat. 99, Ht 58, Wt 170, Wt Change -2 lb, BMI  35.53  .      **Examination**    ---    _General Examination_ :    General  alert and oriented  ,  NAD  .    HEENT:  moist mucous membranes  ,  PERRLA  ,  EOMI  .    Heart:  RRR, normal S1S2  ,  no murmurs  .    Lungs:  clear to auscultation bilaterally  ,  no wheezes/rhonchi/rales  .    Skin:  no rash or skin lesions  .    Neurologic Exam:  No focal neurologic deficit  .          **Assessments**    ---    1\. 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 Need for influenza vaccination - Z23    ---    3\. Hypertension - I10    ---      **Treatment**    ---      **1\. Type 2 diabetes mellitus not at goal**    Stop glimepiride tablet, 2 mg, 1 tab(s), orally, once a day    Start glimepiride tablet, 4 mg, 1 tab(s), orally, once a day, 90 day(s), 90,  Refills 0    Continue metformin tablet, 1000MG , 1 tab(s), orally, 2 times a day    Notes: Hemoglobin A1C is above the goal. Target goal is 7 or less. I will  increase Glimeperide. Continue Metformin. I discussed the importance of good  blood sugar control and adherence to medication. Diabetic diet. Has appt for  eye exam this month. Recheck Hemoglobin A1C in 3 months. I reviewed what to do  if with signs of hypoglycemia.    ---        **2\. Need for influenza vaccination**    Notes: Patient Educated with: FLU Inactive.pdf (FLU Inactive.pdf).        **3\. Hypertension**    Continue atorvastatin tablet, 40 mg, 1 tab(s), orally, once a day (at  bedtime), 90 days, 90, Refills 3    Continue metoprolol tablet, 100 mg, 1 tab(s), orally, 2 times a day, 90 days,  180, Refills 1    Notes:    Controlled. Continue medications.    Marland Kitchen      **Immunization**    ---      Influenza - Hi-dose : 0.5 mL (Dose No:1) (Route:  Intramuscular) given by  Sharyn Dross, RN on Left Deltoid (Need for influenza vaccination)    ---        **Labs**    ------    _Lab: Hemoglobin A1C_ 7.9%    ---        Tameaka Eichhorn B 10/03/2018 10:21:36 AM >    ------      **Procedure Codes**    ---      97948 FLU VACC PRSV FREE INC ANTIG    ---    01655 IMMUNIZATION ADMIN    ---    37482 BLOOD DRAWING    ---    70786 GLYCATED HEMOGLOBIN TEST    ---      **Follow Up**    ---    3 Months    Electronically signed by Peggye Form on 10/20/2018 at 11:19 PM EST    Sign off status: Completed        * * [facility]    [address] [place], [place] [address]    Tel: [phone]    Fax:  [phone]              * * *          Patient: [name] DOB: 02-Jan-1951 Progress Note: Amos Micheals  10/03/2018    ---    Note generated by eClinicalWorks EMR/PM Software ([email or web address])

## 2018-10-07 ENCOUNTER — Ambulatory Visit: Admitting: Otolaryngology

## 2018-10-07 ENCOUNTER — Ambulatory Visit

## 2018-10-07 NOTE — Progress Notes (Signed)
 ---    ---    **Patient:** [name]    **Provider:** Cathie Olden, MD    **DOB:** January 14, 1951 **Age:** 68 Y **Sex:** Female    **Date:** 10/07/2018    **PCP:** EMELYN MOLATO          * * *        ---        **Reason for Appointment**    ---      1\. Otalgia    ---      **History of Present Illness**    ---    __ :    68 year old woman who is seen in the office for issues with her ears and  imbalance. Her daughter is translating for her. Reports over the past 3 months  she has been having issues. She feels pressure in both the ears and mild  discomfort. She almost feels movement as if there is fluid in the ears. Is  also been having episodes of vertigo. When she gets up in the morning or lays  back she will have vertigo that lasts for about a minute. Last episode was a  week ago. No prior episodes. Denies any otorrhea, tinnitus. No ear surgery. No  recurrent ear infections. She cannot think of what caused it. At one point she  was having some nasal congestion but not anymore.      **Current Medications**    ---    Taking      * atorvastatin     ---    * gabapentin     ---    * Metformin     ---    * citalopram     ---    * lisinopril     ---    * Multivitamin     ---    * Prilosec     ---    * Metropolol     ---    * aspirin     ---    * glyburide     ---    * Medication List reviewed and reconciled with the patient     ---      **Past Medical History**    ---      High blood pressure.        ---    High cholesterol.        ---    Diabetes mellitus.        ---        ------      **Family History**    ---      Father: unknown, diagnosed with Unk Fam    ---    high blood pressure, high cholesterol, diabetes.    ---      **Social History**    ---    no Smoking  Are you a: nonsmoker.    no Alcohol.    no Recreational drug use.    Occupation: retired.      **Allergies**    ---      N.K.D.A.    ---      **Review of Systems**    ---    _A detailed ROS was performed and is negative except for  positives noted  below: (ROS sheet in office chart)._ :    Ringing in ears/tinnitus yes. Dizziness/vertigo yes. Sense of ear blockage  yes. Ear pain yes.          **Examination**    ---    _Constitutional_ :  General Appearance:  NAD  .    _Orbits_ :    EOM's  intact,  no spontaneous nystagmus  .    _Ear_ :    Pinnae:  normal R & L Pinnae  . EAC's:  normal R and L EAC  . TMs:  normal R &  L TMs  .    _Nose_ :    general  normal anterior rhinoscopy  .    _Oral cavity/Oropharynx_ :    Lips:  normal  . FOM:  normal  . Tongue:  normal, midline  . Palate:  normal,  elevates symmetrically  . Oropharynx:  normal  . Oral vestibule:  normal  .    _Neck_ :    Palpation:  No masses, adenopathy  .    _Skin of Head/Neck_ :    .  no obvious lesions  .    _Cranial Nerve assessment_ :    Nerves 3 - 12:  normal  .    _Vestibular Exam_ :    Romberg:  normal  . Hallpike:  no nystagmus in either head-down position nor  rising to the sitting position. Subjective vertigo with left ear down  . Gait:  normal  . Cerebellar exam:  normal  .    _Audiology_ :    Indication-  Vertigo evaluation  . interpreted and reviewed with patient.  Scanned into record  .  Marland Kitchen          **Assessments**    ---    1\. Benign paroxysmal positional vertigo of left ear - H81.12 (Primary)    ---    2\. Dysfunction of both Eustachian tubes - H69.83    ---    ---      **Treatment**    ---      **1\. 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 At this  point they are feeling better and hallpike testing was not positive thus  preventing confirmation of the diagnosis. Since they are feeling better will  continue with observation. The pathophysiology of BPPV was reviewed with them  and literature was provided. There is a risk of recurrence, and if this occurs  will call to be seen on an urgent basis for examination, diagnosis, and  treatment. If still symptomatic in  a few weeks can call to have vestibular  rehab.    ---      **Procedure Codes**    ---      74827 AUD AIRBONE    ---    [phone] IMPEDANCE    ---      **Follow Up**    ---    Patient will call when the symptoms recur to be seen on an urgent basis.    Electronically signed by Cathie Olden , MD on 11/02/2018 at 10:33 AM EST    Sign off status: Completed          * * *      Patient: [name], [name]    Provider: Cathie Olden, MD    ------    DOB: 04/08/1951    Date: 10/07/2018    Note generated by eClinicalWorks EMR/PM Software ([email or web address])    ---

## 2018-10-28 ENCOUNTER — Ambulatory Visit (HOSPITAL_BASED_OUTPATIENT_CLINIC_OR_DEPARTMENT_OTHER): Admitting: Family Medicine

## 2018-10-28 NOTE — Progress Notes (Signed)
* * *        **[name]**    ------    89 Y old Female, DOB: 12-03-1950    Account Number: [id number]    [address] [place], [place]    Home: [phone]    Guarantor: Ronney Asters Insurance: Royston Sinner PLAN (MEDICARE REPLACEMENT  Payer ID: [id number]    External Visit ID: [id number]    Appointment Facility: [facility]        * * *    10/28/2018    Progress Note: [name]    ------    ---        **Current Medications**    ---    Taking      * multivitamin Multiple Vitamins capsule 1 cap(s) orally once a day     ---    * Lotrisone 0.05%-1% cream 1 app applied topically 2 times a day, Notes: prn     ---    * aspirin 81 mg delayed release tablet 1 tab(s) orally once a day     ---    * Freestyle Freedom Lite Lancets as directed 2x a day     ---    * Penlac Nail Lacquer 8% solution 1 app applied topically once a day     ---    * Shingrix Vaccine 0.5 ml solution 1 dose IM     ---    * Freestyle Freedom Lite Test Strips as directed 2x a day     ---    * Prilosec OTC 20 mg delayed release tablet 1 tab(s) orally once a day     ---    * losartan 25 mg tablet 1 tab(s) orally once a day     ---    * escitalopram 10 mg tablet 1 tab(s) orally once a day     ---    * gabapentin 300 mg capsule 1 cap(s) orally 2 times a day     ---    * glimepiride 4 mg tablet 1 tab(s) orally once a day     ---    * atorvastatin 40 mg tablet 1 tab(s) orally once a day (at bedtime)     ---    * metoprolol 100 mg tablet 1 tab(s) orally 2 times a day     ---    * metformin 1000MG  tablet 1 tab(s) orally 2 times a day     ---    * Medication List reviewed and reconciled with the patient     ---      Past Medical History    ---      HTN.        ---    high cholesterol-TC-162 HDL-50 LDL-76 TG-182 done 01/13/82.        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        ---    GERD.        ---    BMD-Normal-02/23/14.        ---    Monofilament test-11/18/15-Normal.         ---    Insure Fit-06/13/15- Negative.        ---    Pap-06/13/15 -Negative.        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        ---    Cardiac stress test-03/01/15-positive EKG changes but neg for inducible wall  motion abnormalities.        ---  Lung nodule- following Dr Lattie Corns seen 01/2015.        ---      **Family History**    ---      Father: deceased 70 yrs, MI, diagnosed with Hypertension, Heart Disease    ---    Mother: deceased 64 yrs, DMII, cirrhoisis of the liver, Diabetes    ---    Sibling 1: sister ovarian/uterus CA, Diabetes, Hypertension    ---    Sibling 2: brother - DMII, HTN, Diabetes, Hypertension    ---    4 brother(s) , 2 sister(s) . 2daughter(s) - healthy.    ---      **Social History**    ---    Sexual History  Had sex in the past 12 months (vaginal, oral, or anal)? No  ,  Have you ever had an STD? No.    Tobacco Use  Status: never smoker  , Patient counseled on the dangers of  tobacco use: 10/28/2018.    no Alcohol.    Alcohol Smart [name]  Did you have a drink containing alcohol in the past year?  No  , Points 0  , Interpretation Negative.    Marital Status: married, husband - Antonio.    Children: two Daughters.    Sexually active: not currently.    Home smoke detector use: yes.    Pets: 1 cat, 3 dogs (chihuahuas).    no Drug use.    no Exercise.    Caffeine: coffee in the am.    no Domestic violence.    Diet/Nutrition: Three meals a day, Dairy, Fruits & Vegetables, Protein Foods.    Occupation: retired.    Number of household members: 4, husband, daughter, grandson.    Seat belt: yes.    no Travel outside [place].    Sexual orientation: heterosexual.      **Allergies**    ---      Celexa: hallucinations    ---    lisinopril: cough    ---      **Review of Systems**    ---    _CONSTITUTIONAL_ :    no Fever. no Chills.    _OPTHALMOLOGY_ :    no Change in Vision.    _ENT_ :    no Nasal congestion. no Rhinorrhea. Cough yes.    _RESPIRATORY_ :    no Shortness of Breath. no DOE  (dyspnea on exertion). c/o Persistent Cough.  c/o Chest Congestion. no Chest Pain. no Wheezing.    _CARDIOLOGY_ :    no Chest Pain.    _GASTROENTEROLOGY_ :    no Abdominal Pain. no Nausea. no Vomiting.    _NEUROLOGY_ :    no Headaches.    _DERMATOLOGY_ :    no Rash.            **Reason for Appointment**    ---      1\. Cough/chest congestion per daughter-lmh    ---      **History of Present Illness**    ---    _Sick_ :    68 year old female presents with cough. Productive. Ongoing for few weeks. No  fever and chills. Taking Robitussin cough and colds. Body aches and fatigue.  No weight loss. No wheezing and no shortness of breath.      **Vital Signs**    ---    HR **62** , BP  **140/70** , Temp **99.1 po** , O2 Sat. **98** , Ht 58, Wt  **173** , Wt Change 3 lb, BMI  **  36.15** .      **Examination**    ---    _General Examination_ :    General  alert and oriented  ,  NAD  .    HEENT:  moist mucous membranes  ,  PERRLA  ,  EOMI  .    Lymph nodes  none  .    Heart:  RRR, normal S1S2  ,  no murmurs  .    Lungs:  clear to auscultation bilaterally  ,  no wheezes/rhonchi/rales  .    Extremities  no edema  .    Skin:  no rash or skin lesions  .          **Assessments**    ---    1\. Cough - R05 (Primary)    ---    2\. Acute bronchitis due to infection - J20.8    ---      **Treatment**    ---      **1\. Cough**    Start Zithromax Z-Pak tablet, 250 mg, 2 tablets on the first day, then 1  tablet daily for 4 days, orally, once a day, 5 days, 6, Refills 0    Start Robitussin AC, cough syrup, 1-2 tsp hs, po, 100, Refills 0    Notes: Given its duration I will treat with antibiotic to cover secondary  bacterial infection. Robitussin AC at night and OTC cough medicine during the  day for symptomatic relief of cough. If not better in 1 week or get worse  return for reevaluation.    ---        **2\. Acute bronchitis due to infection**    Start ProAir HFA aerosol, 90 mcg/inh, 2 puff(s), inhaled, every 6 hours, 30  day(s), 1,  Refills 1      **Follow Up**    ---    prn    Electronically signed by [name] on 11/04/2018 at 09:27 PM EST    Sign off status: Completed        * * [facility]    [address] [place], [place] [address]    Tel: [phone]    Fax: [phone]              * * *          Patient: [name], [name] DOB: Feb 03, 1951 Progress Note: Adison Jerger  10/28/2018    ---    Note generated by eClinicalWorks EMR/PM Software ([email or web address])

## 2018-12-01 ENCOUNTER — Ambulatory Visit

## 2018-12-01 ENCOUNTER — Ambulatory Visit: Admitting: Otolaryngology

## 2018-12-01 NOTE — Progress Notes (Signed)
 [name], [name] **DOB:** April 29, 1951 (68 yo F) **Acc No.** [id number] **DOS:**  12/01/2018    ---        ---    ---    **Patient:** [name], [name]    **Provider:** Cathie Olden, MD    **DOB:** [birth year] **Age:** 68 Y **Sex:** Female    **Date:** 12/01/2018    **PCP:** EMELYN MOLATO          * * *        ---        **Reason for Appointment**    ---      1\. Dizziness    ---      **History of Present Illness**    ---    __ :    Returns with her other daughter as her vertigo worsens over the weekend. He  had resolved but then she woke up on Saturday with room spinning vertigo when  she would move her head or bend her head forward. She developed some left  postauricular pain as well. No hearing loss. The vertigo is brief. No head  trauma.      **Current Medications**    ---    Taking      * atorvastatin     ---    * gabapentin     ---    * Metformin     ---    * citalopram     ---    * lisinopril     ---    * Multivitamin     ---    * Prilosec     ---    * Metropolol     ---    * aspirin     ---    * glyburide     ---    * Medication List reviewed and reconciled with the patient     ---      **Past Medical History**    ---      High blood pressure.        ---    High cholesterol.        ---    Diabetes mellitus.        ---        ------      **Surgical History**    ---      denies    ---      **Family History**    ---      Father: unknown, diagnosed with Unk Fam    ---    high blood pressure, high cholesterol, diabetes.    ---      **Social History**    ---    no Smoking  Are you a: nonsmoker.    no Recreational drug use.    no Alcohol.    Occupation: retired.      **Allergies**    ---      N.K.D.A.    ---      **Review of Systems**    ---    _A detailed ROS was performed and is negative except for positives noted  below: (ROS sheet in office chart)._ :    No positives. Patient indicates all items are negative. ..          **Examination**    ---    _Constitutional:_    General Appearance:  NAD  .     _Orbits:_    EOM's  intact,  no spontaneous nystagmus  .    _Ear:_  Pinnae:  normal R & [name] Pinnae  . EAC's:  normal R and [name] EAC  . TMs:  normal R &  [name] TMs  .    _Nose:_    general  normal anterior rhinoscopy  .    _Oral cavity/Oropharynx:_    Lips:  normal  . FOM:  normal  . Tongue:  normal, midline  . Palate:  normal,  elevates symmetrically  . Oropharynx:  normal  . Oral vestibule:  normal  .    _Neck:_    Palpation:  No masses, adenopathy  .    _Skin of Head/Neck:_    .  no obvious lesions  .    _Cranial Nerve assessment:_    Nerves 3 - 12:  normal  .    _Vestibular Exam:_    Romberg:  normal  . Hallpike:  positive  left  ear down latency of 3 seconds  lasted 5 seconds. again on arising. No issues right ear down  . Gait:  normal  . Cerebellar exam:  normal  .          **Assessments**    ---    1\. Benign paroxysmal vertigo of left ear - H81.12 (Primary)    ---    ---      **Treatment**    ---      **1\. Benign paroxysmal vertigo of left ear**    Notes: The patients history and exam finding of nystagus on [facility]  testing are consistent with BPPV. The Epley maneuver was performed and the  BPPV sheet was reviewed. We discussed the usual course of BPPV is improvement  with time and occasional relapses. The exercises were reviewed and these can  be performed if the symptoms recur. I am also happy to have them return as  needed when the vertigo recurs for an Epley maneuver.  .    ---      **Procedures**    ---    Suan Halter maneuver_ :    Procedure  Left ear was placed down and Hallpike performed. Rotatory nystagmus  seen. Allowed to resolve. Head then turned 45 degrees to midline for 30  seconds. then another 45 degrees. Patient then rotated onto side completing  180 degree turn of head. Slight dizziness as expected. After 30 seconds  allowed to sit up. Tolerated procedure well. Will remain upright for 48 hours.          **Procedure Codes**    ---      03212 EPLEY MANEUVER / CANALITH REPOSITIONING    ---       **Follow Up**    ---    prn,Patient will call when the symptoms recur to be seen on an urgent basis.,  Patient told to call/follow up sooner if there are questions, problems, or  concerns.    Electronically signed by Cathie Olden , MD on 12/29/2018 at 09:38 AM EDT    Sign off status: Completed          * * *      Provider: Cathie Olden, MD    Date: 12/01/2018    ------    Note generated by eClinicalWorks EMR/PM Software ([email or web address])    ---

## 2018-12-01 NOTE — Progress Notes (Signed)
* * *        **  TASHIRA, TORRE**    ------    32 Y old Female, DOB: 1951/06/01    [address] [place], [place], [place] [address]    Home: [phone]    Provider: Romualdo Bolk        * * *    Telephone Encounter    ---    Answered by    Mat Carne    Date: 12/01/2018        Time: 09:03 AM    Caller    Marias daughter    ------            Reason    add on TODAY            Message                      Pt is looking to be seen for dizziness. She has an appt on 3/17 but woud like sooner. There is no audios available soon nor open follow up slots. Please advise [phone]                 Action Taken                      Doy Mince  12/01/2018 09:08:55 AM > Only available spot we have today is for 2pm with audio if she will like/thanks      Matos,Adriean  12/01/2018 09:43:21 AM > pt agrees , added on                    * * *                ---          * * *          Patient: Welborn, Chava L DOB: Jan 01, 1951 Provider: Cathie Olden K 12/01/2018    ---    Note generated by eClinicalWorks EMR/PM Software ([email or web address])

## 2018-12-11 ENCOUNTER — Ambulatory Visit (HOSPITAL_BASED_OUTPATIENT_CLINIC_OR_DEPARTMENT_OTHER): Admitting: Family Medicine

## 2018-12-11 NOTE — Progress Notes (Signed)
* * *        **  MONTZERRAT, BRUNELL**    ------    89 Y old Female, DOB: 13-May-1951    [address] [place], [place] [address]    Home: [phone]    Provider: Alfonso Ramus        * * *    Telephone Encounter    ---    Answered by    Peggye Form B    Date: 12/11/2018        Time: 10:32 AM    Reason    Tamiflu Prophylaxis    ------            Message                      Daughter positive for Flu A                Action Taken                      Abryanna Musolino B 12/11/2018 10:32:35 AM > Rxs sent                Refills    Start Tamiflu capsule, 75 mg, orally, 10, 1 cap(s), once a day, 10  days, Refills=0    ------          * * *                ---          * * *          Patient: Dobbin, Shiara L DOB: [birth year] Provider: Peggye Form B  12/11/2018    ---    Note generated by eClinicalWorks EMR/PM Software ([email or web address])

## 2018-12-12 ENCOUNTER — Ambulatory Visit (HOSPITAL_BASED_OUTPATIENT_CLINIC_OR_DEPARTMENT_OTHER): Admitting: Family Medicine

## 2018-12-12 NOTE — Progress Notes (Signed)
* * *        **  [name], [name]**    ------    36 Y old Female, DOB: Jul 10, 1951    [address], [place], [place] [address]    Home: [phone]    Provider: Alfonso Ramus        * * *    Web Encounter    ---    Answered by    Staff, Clinical    Date: 12/12/2018        Time: 10:13 AM    Caller    Eric Vanderford    ------            Reason    New Refill Request            Message                      ***Start of Medication List: ***       losartan 25 mg 1 tab(s)  orally once a day 90 days  #90  with 1 refill(s)      ***End of Med List ***       Comments:                Action Taken                      Adria Dill  12/12/2018 12:35:29 PM > Last f/u 10/03/2018. Has appt scheduled 01/06/2019      Vegas Coffin B 12/12/2018 1:28:38 PM > Hi Fiorela, your refill request has been sent                Refills    Refill losartan tablet, 25 mg, orally, 90, 1 tab(s), once a day,  90 days, Refills=1    ------          * * *        **eMessages**   From:    Leverett Camplin    ------    Created:    2018-12-12 13:33:22.0    Sent:      Subject:    RE:New Refill Request    Message:                      Alfonso Ramus 12/12/2018 1:28:38 PM > Hi [name], your refill request has been sent                        ---          * * *          Patient: [name], [name] DOB: June 24, 1951 Provider: Peggye Form B  12/12/2018    ---    Note generated by eClinicalWorks EMR/PM Software ([email or web address])

## 2018-12-29 ENCOUNTER — Ambulatory Visit (HOSPITAL_BASED_OUTPATIENT_CLINIC_OR_DEPARTMENT_OTHER): Admitting: Family Medicine

## 2018-12-29 NOTE — Progress Notes (Signed)
* * *        **  [name], [name]**    ------    38 Y old Female, DOB: 1951/02/13    [address], [place], [place] [address]    Home: [phone]    Provider: Alfonso [name]        * * *    Web Encounter    ---    Answered by    Staff, Clinical    Date: 12/29/2018        Time: 12:19 PM    Caller    [name]    ------            Reason    New Refill Request            Message                      ***Start of Medication List: ***       escitalopram 10 mg 1 tab(s)  orally once a day 90 days  #90  with 1 refill(s)glimepiride 4 mg 1 tab(s)  orally once a day 90 day(s)  #90 with no refill(s)      ***End of Med List ***       Comments:                Action Taken                      [name]  12/29/2018 2:00:13 PM > last A1C 10/03/18 at last FU appt.  Has FU appt pending for 02/10/19.      [name] 12/30/2018 8:40:42 AM > Hi [name], your refill request has been sent                Refills    Refill escitalopram tablet, 10 mg, orally, 90, 1 tab(s), once a  day, 90 days, Refills=1    ------      Refill glimepiride tablet, 4 mg, orally, 90, 1 tab(s), once a day, 90  day(s), Refills=0          * * *        **eMessages**   [name]    ------    Created:    2018-12-30 08:41:05.0    Sent:      Subject:    RE:New Refill Request    Message:                      [name] 12/30/2018 8:40:42 AM > Hi [name], your refill request has been sent                        ---          * * *          Patient: [name], [name] DOB: Jul 06, 1951 [name]  12/29/2018    ---    Note generated by eClinicalWorks EMR/PM Software ([email or web address])

## 2019-01-14 ENCOUNTER — Ambulatory Visit

## 2019-02-27 ENCOUNTER — Ambulatory Visit (HOSPITAL_BASED_OUTPATIENT_CLINIC_OR_DEPARTMENT_OTHER): Admitting: Family Medicine

## 2019-02-27 NOTE — Progress Notes (Signed)
* * *        **  [name], [name]**    ------    31 Y old Female, DOB: 05-Oct-1950    [address], [place], [place] [address]    Home: [phone]    Provider: Alfonso Ramus        * * *    Web Encounter    ---    Answered by    Staff, Clinical    Date: 02/27/2019        Time: 05:48 PM    Caller    Treasa Lightner    ------            Reason    New Refill Request            Message                      ***Start of Medication List: ***       metformin 1000MG  1 tab(s)  orally 2 times a day   with no refill(s)gabapentin 300 mg 1 cap(s)  orally 2 times a day 90 days  #180  with 1 refill(s)      ***End of Med List ***       Comments:                Action Taken                      Orren Pietsch B 02/28/2019 9:17:42 AM > Hi [name], your refill request has been sent to your pharmacy. Please make appointment for follow up. We are doing telemedicine due to Coronavirus pandemic. Stay safe.                Refills    Refill metformin tablet, 1000MG , orally, 180 Tablet, 1 tab(s), 2  times a day, 90 days, Refills=0    ------      Refill gabapentin capsule, 300 mg, orally, 180, 1 cap(s), 2 times a day, 90  days, Refills=0          * * *        **eMessages**   From:    Leialoha Hanna    ------    Created:    2019-02-28 09:19:39.0    Sent:      Subject:    RE:New Refill Request    Message:                      Alfonso Ramus 02/28/2019 9:17:42 AM > Hi [name], your refill request has been sent to your pharmacy. Please make appointment for follow up. We are doing telemedicine due to Coronavirus pandemic. Stay safe.                        ---          * * *          Patient: [name], [name] DOB: [birth year] Provider: Peggye Form B  02/27/2019    ---    Note generated by eClinicalWorks EMR/PM Software ([email or web address])

## 2019-04-01 ENCOUNTER — Ambulatory Visit (HOSPITAL_BASED_OUTPATIENT_CLINIC_OR_DEPARTMENT_OTHER): Admitting: Family Medicine

## 2019-04-01 NOTE — Progress Notes (Signed)
* * *        **  [name], [name]**    ------    52 Y old Female, DOB: June 09, 1951    [address] [place], [place] [address]    Home: [phone]    Provider: Alfonso Ramus        * * *    Web Encounter    ---    Answered by    Staff, Clinical    Date: 04/01/2019        Time: 05:43 PM    Caller    Anaiya Lapidus    ------            Reason    New Refill Request            Message                      ***Start of Medication List: ***       metoprolol 100 mg 1 tab(s)  orally 2 times a day 90 days  #180  with 1 refill(s)glimepiride 4 mg 1 tab(s)  orally once a day 90 day(s)  #90 with no refill(s)      ***End of Med List ***       Comments:                Action Taken                      [name]  04/02/2019 7:09:29 AM > duplicate message                    * * *                ---          * * *          Patient: [name], [name] DOB: 1951/08/12 Provider: Peggye Form B  04/01/2019    ---    Note generated by eClinicalWorks EMR/PM Software ([email or web address])

## 2019-04-01 NOTE — Progress Notes (Signed)
* * *        **Pinon, Raeanne L**    ------    39 Y old Female, DOB: Apr 10, 1951    [address] [place], [place] [address]    Home: [phone]    Provider: Cheryle Horsfall        * * *    Web Encounter    ---    Answered by    Staff, Clinical    Date: 04/01/2019        Time: 05:43 PM    Caller    Eric Gittleman    ------            Reason    New Refill Request            Message                      ***Start of Medication List: ***       metoprolol 100 mg 1 tab(s)  orally 2 times a day 90 days  #180  with 1 refill(s)glimepiride 4 mg 1 tab(s)  orally once a day 90 day(s)  #90 with no refill(s)      ***End of Med List ***       Comments:                Action Taken                      [name]  04/02/2019 7:09:58 AM > last A1C 10/03/18 from last FU appt, last CPE 12/17/17, no pending appts.  Forwarding to on-call provider as Dr. Dot Been is out this week.      Kallyn Demarcus G 04/02/2019 8:25:39 AM >  refills sent.  needs 1 more DM follow up before the end of the year.  tx PH      Prugh,RN ,Melanie  04/02/2019 8:29:36 AM > pt notified of above via portal message.                Refills    Continue metoprolol tablet, 100 mg, orally, 180, 1 tab(s), 2 times  a day, Refills=1    ------      Continue glimepiride tablet, 4 mg, orally, 90, 1 tab(s), once a day,  Refills=1          * * *        **eMessages**   From:    [name]    ------    Created:    2019-04-02 08:29:34.0    Sent:      Subject:    RE:New Refill Request    Message:    [name], I received your portal message this morning. Dr. Dot Been  is out of the office this week, so I put your request in to the on-call doctor  for today. He did send in your refills to the pharmacy, you are due for a  follow-up appt. in the next month or so, your last one was in January. You can  call the office to set up that appt., our number is [phone].        Thanks,    Ivy Lynn, RN-BSN                    ---          * * *          Patient:  [name], [name] DOB: 1950/12/03 Provider: Cheryle Horsfall  04/01/2019    ---    Note generated by eClinicalWorks EMR/PM Software ([email or web address])

## 2019-05-28 ENCOUNTER — Ambulatory Visit: Admitting: Family Medicine

## 2019-05-28 ENCOUNTER — Ambulatory Visit (HOSPITAL_BASED_OUTPATIENT_CLINIC_OR_DEPARTMENT_OTHER): Admitting: Family Medicine

## 2019-05-28 NOTE — Progress Notes (Signed)
* * *        **[name], [name]**    ------    68 Y old Female, DOB: [birth year]    Account Number: [id number]    [address] [place], [id number]    Home: [phone]    Guarantor: [name], Georgann [name] Insurance: Royston Sinner PLAN (MEDICARE REPLACEMENT  Payer ID: [id number]    External Visit ID: [id number]    Appointment Facility: [facility]        * * *    05/28/2019    Progress Note: [name]    ------    ---        **Current Medications**    ---    Taking      * multivitamin Multiple Vitamins capsule 1 cap(s) orally once a day     ---    * Lotrisone 0.05%-1% cream 1 app applied topically 2 times a day, Notes: prn     ---    * aspirin 81 mg delayed release tablet 1 tab(s) orally once a day     ---    * Freestyle Freedom Lite Lancets as directed 2x a day     ---    * Penlac Nail Lacquer 8% solution 1 app applied topically once a day     ---    * Shingrix Vaccine 0.5 ml solution 1 dose IM     ---    * Freestyle Freedom Lite Test Strips as directed 2x a day     ---    * Prilosec OTC 20 mg delayed release tablet 1 tab(s) orally once a day     ---    * atorvastatin 40 mg tablet 1 tab(s) orally once a day (at bedtime)     ---    * losartan 25 mg tablet 1 tab(s) orally once a day     ---    * escitalopram 10 mg tablet 1 tab(s) orally once a day     ---    * metformin 1000MG  tablet 1 tab(s) orally 2 times a day     ---    * gabapentin 300 mg capsule 1 cap(s) orally 2 times a day     ---    * metoprolol 100 mg tablet 1 tab(s) orally 2 times a day     ---    * glimepiride 4 mg tablet 1 tab(s) orally once a day     ---    Discontinued    * ProAir HFA 90 mcg/inh aerosol 2 puff(s) inhaled every 6 hours     ---    * Zithromax Z-Pak 250 mg tablet 2 tablets on the first day, then 1 tablet daily for 4 days orally once a day     ---    * Robitussin AC cough syrup 1-2 tsp hs po     ---    * Tamiflu 75 mg capsule 1 cap(s) orally once a day     ---    * Medication List reviewed and reconciled with the patient      ---      Past Medical History    ---      HTN.        ---    high cholesterol-TC-162 HDL-50 LDL-76 TG-182 done 4/86/28.        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        ---    GERD.        ---  BMD-Normal-02/23/14.        ---    Monofilament test-11/18/15-Normal.        ---    Insure Fit-06/13/15- Negative.        ---    Pap-06/13/15 -Negative.        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        ---    Cardiac stress test-03/01/15-positive EKG changes but neg for inducible wall  motion abnormalities.        ---    Lung nodule- following Dr Lattie Corns seen 01/2015.        ---      **Surgical History**    ---      ectopic pregnancies    ---    left ankle screws 2010    ---      **Family History**    ---      Father: deceased 72 yrs, MI, diagnosed with Hypertension, Heart Disease    ---    Mother: deceased 55 yrs, DMII, cirrhoisis of the liver, Diabetes    ---    Sibling 1: sister ovarian/uterus CA, Diabetes, Hypertension    ---    Sibling 2: brother - DMII, HTN, Diabetes, Hypertension    ---    4 brother(s) , 2 sister(s) . 2daughter(s) - healthy.    ---      **Social History**    ---    Sexual History  Had sex in the past 12 months (vaginal, oral, or anal)? No  ,  Have you ever had an STD? No.    Tobacco Use  Status: never smoker  , Patient counseled on the dangers of  tobacco use: 05/28/2019.    no Alcohol.    Alcohol Smart Form  Did you have a drink containing alcohol in the past year?  No  , Points 0  , Interpretation Negative.    Marital Status: married, husband - Antonio.    Children: two Daughters.    Sexually active: not currently.    Home smoke detector use: yes.    Pets: 2 cat, 3 dogs (chihuahuas).    no Drug use.    no Exercise.    Caffeine: coffee in the am.    no Domestic violence.    Diet/Nutrition: Three meals a day, Dairy, Fruits & Vegetables, Protein Foods.    Occupation: retired.    Number of household members: 6, self, husband,  daughter, son in law, grand  daughter, grandson.    Seat belt: yes.    no Travel outside [place].    Sexual orientation: heterosexual.      **Allergies**    ---      Celexa: hallucinations    ---    lisinopril: cough    ---      **Hospitalization/Major Diagnostic Procedure**    ---      No Hospitalization History.    ---      **Review of Systems**    ---    _CONSTITUTIONAL_ :    no Fever. no Chills. no Sweats.    _OPTHALMOLOGY_ :    no Change in Vision.    _RESPIRATORY_ :    no Shortness of Breath. no DOE (dyspnea on exertion). no Persistent Cough.    _CARDIOLOGY_ :    no Chest Pain.    _GASTROENTEROLOGY_ :    no Abdominal Pain. no Nausea. no Vomiting.    _NEUROLOGY_ :    no Headaches.    _DERMATOLOGY_ :  c/o Swelling,  right great toe  . no Rash.            **Reason for Appointment**    ---      1\. right great toenail ?ingrown per daughter, pt tried to remove, now  painful and draining pus, denies COVID sx, aware of COVID policy    ---    2\. Needs refill on metformin    ---      **History of Present Illness**    ---    _Sick Visit_ :    68 year old female presents with infected ingrown great toe nail of the right.  There is swelling and drainage on dorsum of distal phalanx close to the nail.  She noticed few days ago. It is painful. No fever and chills.      **Vital Signs**    ---    HR **56** , BP  **146/72** , Repeat BP **138/70** , O2 Sat. **98** , Ht 58, Wt  **173.2** , Wt Change .2 lb, BMI  **36.19** .      **Examination**    ---    _General Examination_ :    General  alert and oriented  ,  NAD  .    HEENT:  moist mucous membranes  ,  PERRLA  ,  EOMI  .    Lymph nodes  none  .    Heart:  RRR, normal S1S2  ,  no murmurs  .    Lungs:  clear to auscultation bilaterally  ,  no wheezes/rhonchi/rales  .    Extremities  no edema  .    Skin:  There is swelling with draining pus on the dorsum of right great close  t o the base of the naile.  .          **Assessments**    ---    1\. Ingrown toenail of right  foot with infection - L60.0 (Primary)    ---    2\. 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 Hypertension - I10    ---      **Treatment**    ---      **1\. Ingrown toenail of right foot with infection**    Start Keflex capsule, 500 mg, 1 cap(s), orally, every 8 hours, 10 day(s), 30,  Refills 0    Start naproxen tablet, 500 mg, 1 tab(s), orally, 2 times a day, 15 days, 30  Tablet, Refills 0    _LAB: Wound Culture_ MRSA    [facility]    [facility], [facility], [address], [place], [place], [address]      \-    ------------      Irish Piech B 06/05/2019 6:44:02 PM > See TE    ------        Notes: I will treat with Keflex. Culture taken will tailor antibiotic  accordingly. I advised to return if it get worse. I will refer her to  podiatrist.    Referral TM:[id number] - Surgical Chiropody    Reason:Urgent referral diabetic with infected ingrown toenal/  onychomycosis/hypertrophic nails            **2\. Type 2 diabetes mellitus with diabetic neuropathy, unspecified**    Continue glimepiride tablet, 4 mg, 1 tab(s), orally, once a day    _LAB: Hemoglobin A1c_    _LAB: Lipid Panel_    _LAB: Microalbumin Level Urine_    Notes: Will get routine  blood test. Continue current medications. Diabetic  diet.        **3\. Hypertension**    Continue metformin tablet, 1000MG , 1 tab(s), orally, 2 times a day, 90 days,  180 Tablet, Refills 1    Continue losartan tablet, 25 mg, 1 tab(s), orally, once a day    Continue metoprolol tablet, 100 mg, 1 tab(s), orally, 2 times a day    _LAB: Comprehensive Metabolic Panel_    Notes: Controlled. Continue current medication.      **Follow Up**    ---    prn    Electronically signed by Peggye Form on 06/07/2019 at 09:14 PM EDT    Sign off status: Completed        * * [facility]    [address] [place], [place] [address]    Tel: [phone]    Fax: [phone]              * * *          Patient:  [name], [name] DOB: [birth year] Progress Note: [name]  05/28/2019    ---    Note generated by eClinicalWorks EMR/PM Software ([email or web address])

## 2019-05-29 ENCOUNTER — Ambulatory Visit (HOSPITAL_BASED_OUTPATIENT_CLINIC_OR_DEPARTMENT_OTHER): Admitting: Family Medicine

## 2019-05-29 NOTE — Progress Notes (Signed)
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                             Action Taken                      Toris Laverdiere B 05/29/2019 1:37:03 PM > Please call pt. Let her know the Wound culture showed probably MRSA. I will change antibiotic to BactrimDS-rx sent.   Stop Keflex            trask,madison  05/29/2019 2:50:32 PM > spoke to dtr, informed of results. Agrees to terms and will relay to patient.                Refills    Start Bactrim DS tablet, 800 mg-160 mg, orally, 20, 1 tab(s),  every 12 hours, 10 day(s), Refills=0    ------          * * *                ---          * * *          Patient: [name], [name] DOB: [birth year] Provider: Peggye Form B  05/29/2019    ---    Note generated by eClinicalWorks EMR/PM Software ([email or web address])

## 2019-06-16 ENCOUNTER — Ambulatory Visit: Admitting: Family Medicine

## 2019-06-16 ENCOUNTER — Ambulatory Visit (HOSPITAL_BASED_OUTPATIENT_CLINIC_OR_DEPARTMENT_OTHER): Admitting: Family Medicine

## 2019-06-16 LAB — HX LIPID PANEL
CASE NUMBER: 2020259000934
HX CHOL: 161 mg/dL — NL
HX HDL: 48 mg/dL — NL
HX LDL: 70 mg/dL — NL
HX TRIG: 215 mg/dL — ABNORMAL HIGH

## 2019-06-16 LAB — HX MICROALBUMIN LEVEL URINE
CASE NUMBER: 2020259000935
HX MICROALBUMIN/CREAT RATIO: 13.2 mg_alb/Gm_crea — NL (ref 0.0–30.0)
HX UR CREAT (MICROALBUMIN): 117 mg/dL — NL (ref 15.0–392.0)
HX UR MICROALBUMIN RANDOM: 15.4 mg/L — NL

## 2019-06-16 LAB — HX HEMOGLOBIN A1C
CASE NUMBER: 2020259000934
HX EST AVERAGE GLUCOSE (EAG): 169 mg/dL
HX HBF (INTERNAL): 0.8 % — NL
HX HEMOGLOBIN A1C: 7.5 % — ABNORMAL HIGH
HX LA1C (INTERNAL): 2.4 % — NL
HX P3 PEAK (INTERNAL): 6.5 % — NL
HX TOTAL AREA RANGE (INTERNAL): 156799 microvolt/sec — NL (ref 50000.0–350000.0)

## 2019-06-16 LAB — HX COMPREHENSIVE METABOLIC PANEL
CASE NUMBER: 2020259000934
HX ALBUMIN LVL: 3.9 g/dL — NL (ref 3.2–5.0)
HX ALKALINE PHOSPHATASE: 88 U/L — NL (ref 30.0–117.0)
HX ALT: 36 U/L — NL (ref 6.0–55.0)
HX ANION GAP: 5 — NL (ref 3.0–11.0)
HX AST: 22 U/L — NL (ref 6.0–40.0)
HX BILIRUBIN TOTAL: 0.3 mg/dL — NL (ref 0.2–1.2)
HX BUN: 24 mg/dL — ABNORMAL HIGH (ref 8.0–23.0)
HX CALCIUM LVL: 9.6 mg/dL — NL (ref 8.5–10.5)
HX CHLORIDE: 109 mmol/L — NL (ref 98.0–110.0)
HX CO2: 26 mmol/L — NL (ref 21.0–32.0)
HX CREATININE: 1.25 mg/dL — NL (ref 0.55–1.3)
HX GLUCOSE LVL: 157 mg/dL — ABNORMAL HIGH (ref 70.0–110.0)
HX POTASSIUM LVL: 5.7 mmol/L — ABNORMAL HIGH (ref 3.6–5.2)
HX SODIUM LVL: 140 mmol/L — NL (ref 136.0–146.0)
HX TOTAL PROTEIN: 6.8 g/dL — NL (ref 6.0–8.4)

## 2019-06-16 LAB — HX GLOMERULAR FILTRATION RATE (ESTIMATED)
CASE NUMBER: 2020259000934
HX AFN AMER GLOMERULAR FILTRATION RATE: 51 mL/min/{1.73_m2}
HX NON-AFN AMER GLOMERULAR FILTRATION RATE: 45 mL/min/{1.73_m2}

## 2019-06-16 NOTE — Progress Notes (Signed)
* * *        **  SREYA, FROIO**    ------    64 Y old Female, DOB: October 27, 1950    [address], [place], [place] [address]    Home: [phone]    Provider: Alfonso Ramus        * * *    Telephone Encounter    ---    Answered by    Drue Stager    Date: 06/16/2019        Time: 12:51 PM    Caller    Daughter    ------            Reason    refills            Message                      Daughter calling for refills for pt. Needing Gabapentin 300 mg, and losartan 25 mg [facility]                Action Taken                      trask,madison  06/16/2019 12:53:51 PM > Last OV 05/28/19, next OV 06/23/2019      Asif Muchow B 06/16/2019 1:18:05 PM > Rxs sent      trask,madison  06/16/2019 2:26:16 PM > noted.                Refills    Refill gabapentin capsule, 300 mg, orally, 180, 1 cap(s), 2 times  a day, 90 days, Refills=0    ------      Refill losartan tablet, 25 mg, orally, 90 Tablet, 1 tab(s), once a day, 90  days, Refills=0          * * *                ---          * * *          Patient: Vanderpol, Kristeen L DOB: 1951-08-02 Provider: Peggye Form B  06/16/2019    ---    Note generated by eClinicalWorks EMR/PM Software ([email or web address])

## 2019-06-17 ENCOUNTER — Ambulatory Visit (HOSPITAL_BASED_OUTPATIENT_CLINIC_OR_DEPARTMENT_OTHER): Admitting: Family Medicine

## 2019-06-22 ENCOUNTER — Ambulatory Visit (HOSPITAL_BASED_OUTPATIENT_CLINIC_OR_DEPARTMENT_OTHER): Admitting: Family Medicine

## 2019-06-22 NOTE — Progress Notes (Signed)
* * *      [name], [name] **DOB:** Aug 22, 1951 (68 yo F) **Acc No.** [id number] **DOS:**  06/22/2019    ---        [name], [name]**    ------    44 Y old Female, DOB: [birth year]    [address], [place], [place] [address]    Home: [phone]    Provider: Alfonso Ramus        * * *    Telephone Encounter    ---    Answered by    [name]    Date: 06/22/2019        Time: 07:52 AM    Reason    lab results    ------            Message                      labs from 06/16/19:  potassium 5.7, glucose 157, BUN 24, A1C 7.5                Action Taken                      Fread Kottke B 06/24/2019 4:16:14 PM > Discussed on her appt.                     * * *                ---          * * *          Provider: Peggye Form B 06/22/2019    ---    Note generated by eClinicalWorks EMR/PM Software ([email or web address])

## 2019-06-23 ENCOUNTER — Ambulatory Visit: Admitting: Family Medicine

## 2019-06-23 ENCOUNTER — Ambulatory Visit (HOSPITAL_BASED_OUTPATIENT_CLINIC_OR_DEPARTMENT_OTHER): Admitting: Family Medicine

## 2019-06-23 LAB — HX POTASSIUM LEVEL
CASE NUMBER: 2020266002314
HX POTASSIUM LVL: 5 mmol/L — NL (ref 3.6–5.2)

## 2019-06-23 NOTE — Progress Notes (Signed)
* * *    [name], [name] **DOB:** 01/18/1951 (68 yo F) **Acc No.** [id number] **DOS:**  06/23/2019    ---        [name], [name]**    ------    68 Y old Female, DOB: 09/13/1951    Account Number: [id number]    [address], [place], [id number]    Home: [phone]    Guarantor: Ronney Asters Insurance: Royston Sinner PLAN (MEDICARE REPLACEMENT  Payer ID: [id number]    External Visit ID: [id number]    Appointment Facility: [facility]        * * *    06/23/2019    Progress Note: [name]    ------    ---        **Current Medications**    ---    Taking      * multivitamin Multiple Vitamins capsule 1 cap(s) orally once a day     ---    * Lotrisone 0.05%-1% cream 1 app applied topically 2 times a day, Notes: prn     ---    * aspirin 81 mg delayed release tablet 1 tab(s) orally once a day     ---    * Freestyle Freedom Lite Lancets as directed 2x a day     ---    * Penlac Nail Lacquer 8% solution 1 app applied topically once a day     ---    * Shingrix Vaccine 0.5 ml solution 1 dose IM     ---    * Freestyle Freedom Lite Test Strips as directed 2x a day     ---    * Prilosec OTC 20 mg delayed release tablet 1 tab(s) orally once a day     ---    * atorvastatin 40 mg tablet 1 tab(s) orally once a day (at bedtime)     ---    * escitalopram 10 mg tablet 1 tab(s) orally once a day     ---    * naproxen 500 mg tablet 1 tab(s) orally 2 times a day     ---    * metformin 1000MG  tablet 1 tab(s) orally 2 times a day     ---    * Bactrim DS 800 mg-160 mg tablet 1 tab(s) orally every 12 hours     ---    * glimepiride 4 mg tablet 1 tab(s) orally once a day     ---    * metoprolol 100 mg tablet 1 tab(s) orally 2 times a day     ---    * gabapentin 300 mg capsule 1 cap(s) orally 2 times a day     ---    * losartan 25 mg tablet 1 tab(s) orally once a day     ---    Unknown    * Keflex 500 mg capsule 1 cap(s) orally every 8 hours     ---      Past Medical History    ---      HTN.        ---    high cholesterol-TC-162  HDL-50 LDL-76 TG-182 done 4/69/62.        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        ---    GERD.        ---    BMD-Normal-02/23/14.        ---    Monofilament  test-11/18/15-Normal.        ---    Insure Fit-06/13/15- Negative.        ---    Pap-06/13/15 -Negative.        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        ---    Cardiac stress test-03/01/15-positive EKG changes but neg for inducible wall  motion abnormalities.        ---    Lung nodule- following Dr Lattie Corns seen 01/2015.        ---      **Surgical History**    ---      ectopic pregnancies    ---    left ankle screws 2010    ---      **Family History**    ---      Father: deceased 62 yrs, MI, diagnosed with Heart Disease, Hypertension    ---    Mother: deceased 64 yrs, DMII, cirrhoisis of the liver, Diabetes    ---    Sibling 1: sister ovarian/uterus CA, Diabetes, Hypertension    ---    Sibling 2: brother - DMII, HTN, Hypertension, Diabetes    ---    4 brother(s) , 2 sister(s) . 2daughter(s) - healthy.    ---      **Social History**    ---    Sexual History  Had sex in the past 12 months (vaginal, oral, or anal)? No  ,  Have you ever had an STD? No.    Alcohol Smart Form  Did you have a drink containing alcohol in the past year?  No  , Points 0  , Interpretation Negative.    Tobacco Use  Status: never smoker  , Patient counseled on the dangers of  tobacco use: 05/28/2019.    Seat belt: yes.    no Alcohol.    no Drug use.    Diet/Nutrition: Three meals a day, Dairy, Fruits & Vegetables, Protein Foods.    no Exercise.    Marital Status: married, husband - Antonio.    no Domestic violence.    Children: two Daughters.    Number of household members: 6, self, husband, daughter, son in law, grand  daughter, grandson.    Occupation: retired.    no Travel outside [place].    Caffeine: coffee in the am.    Home smoke detector use: yes.    Pets: 2 cat, 3 dogs (chihuahuas).    Sexual orientation:  heterosexual.    Sexually active: not currently.      **Gyn History**    ---    Periods : no spotting since menopause.    Last pap smear date 2016 no HPV done.    Last mammogram date 2017.    Date of Last Period unsure, somewhere around age 74-55.    Menarche 13.      **OB History**    ---    Total pregnancies 7\.    Total living children 2\.    Miscarriage(s) 5\.      **Allergies**    ---      Celexa: hallucinations    ---    lisinopril: cough    ---      **Review of Systems**    ---    _CONSTITUTIONAL_ :    Unexplained weight change  none  .    _OPTHALMOLOGY_ :    Glasses or contacts  none  .    _ENT_ :    Hoarseness  none  .  Swollen Lymph Nodes  none  . Difficulty swallowing  no  .  Dental care  up-to-date  .    _ALLERGY_ :    Environmental Allergies  none  .    _RESPIRATORY_ :    Shortness of Breath  none  . Persistent Cough  none  .    _CARDIOLOGY_ :    Chest Pain  none  . Palpitations  none  . Leg Edema  none  . Exertional  symptoms  none  .    _GASTROENTEROLOGY_ :    Dysphagia  none  . Abdominal Pain  none  . Constipation  none  . Diarrhea  none  . Blood in Stool  none  . Heartburn  none  .    _FEMALE REPRODUCTIVE_ :    Hot Flashes  none  . Abnormal Vaginal Discharge  none  . Dyspareunia  none  .  Breast lump  none  . Practices SBE  regulary  . Vaginal dryness  none  .    _UROLOGY_ :    Blood in Urine  none  . Urinary Incontinence  none  . Nocturia  None  .  Dysuria  none  .    _MUSCULOSKELETAL_ :    Joint Swelling  none  . Joint Pain  none  . Joint Stiffness  none  .    _NEUROLOGY_ :    Tingling/Numbness  none  . Dizziness  none  . Weakness  none  . Visual Changes  none  . Headaches  none  .    _PSYCHOLOGY_ :    Depression  none  . Sleep Disturbances  none  . Anxiety  none  .    _DERMATOLOGY_ :    New or changing skin lesions  none  .    _HEMATOLOGY/LYMPH_ :    Bleeding tendencies  none  .            **Reason for Appointment**    ---      1\. CPE w/o pap    ---    2\. due for Mammo and colonosocopy?     ---    3\. Follow up on Ingrown toenail of right foot with infection    ---    4\. Refill for lotrisone cream    ---      **History of Present Illness**    ---    _Female CPE/Screening_ :    68 year old female presents with c/o complete physical exam. .    Denies : Recent illness, new health problem, or hospitalization.. Denies :  Recent consults - . Denies : PAP/HPV - .    Mammogram -  Date done - 01/27/18  ,  Normal  ,  Due now  . BMD -  Date done -  01/27/18  ,  Normal  . Colonoscopy -  Refuses  . Immunizations  Up to date  ,  Recommend flu shot now  ,  Recommend Shingrix thru pharmacy  .    Taking Prilosec for GERD.    Taking Lexapro for depression.    _Diabetes_ :    HOME GLUCOSE MONITORING  120-150  . HYPOGLYCEMIC EPISODES  none  . COMPLIANCE  with medications  . HGBA1C  7.5  . LDL  70  . MICROALBUMINURIA  normal  . LAST  EYE EXAM Date 12/02/2018, Copy of Report on file? Yes. EYE DOCTOR  Dr Su Hilt  . FLU SHOT  received  . PNEUMOVAX  received  .      **  Vital Signs**    ---    HR **59** , BP **136/76** , O2 Sat. **98** , Ht 58, Wt 174.2, Wt Change 1 lb,  BMI  **36.40** .      **Examination**    ---    _General Examination:_    General  well nourished, well-hydrated female in  NAD  ,  well nourished,  well-hydrated female in  NAD  .    HEENT:  TMs and ear canals clear, conjunctiva clear, nares patent, oropharynx  clear with MMM  ,  TMs and ear canals clear, conjunctiva clear, nares patent,  oropharynx clear with MMM  .    Neck, Thyroid :  supple  ,  no thyromegaly  ,  no lymphadenopathy  ,  no JVD  ,  no carotid bruit  ,  supple  ,  no thyromegaly  ,  no lymphadenopathy  ,  no JVD  ,  no carotid bruit  .    Breasts :  no lumps felt on either side  ,  no dimpling  ,  no skin changes  ,  no axillary lymphadenopathy  ,  no lumps felt on either side  ,  no dimpling  ,  no skin changes  ,  no axillary lymphadenopathy  .    Heart:  RRR  ,  no murmurs  ,  RRR  ,  no murmurs  .    Lungs:  clear to auscultation  bilaterally  ,  clear to auscultation  bilaterally  .    Abdomen:  soft, NT/ND, BS present  ,  no hepatosplenomegaly  ,  soft, NT/ND,  BS present  ,  no hepatosplenomegaly  .    Extremities  normal ROM  ,  no edema  ,  normal ROM  ,  no edema  .    Peripheral pulses:  normal (2+) bilaterally  ,  normal (2+) bilaterally  .    Skin:  no rash or suspicious skin lesions  ,  no rash or suspicious skin  lesions  .    Neurologic Exam:  non-focal exam  ,  non-focal exam  .          **Assessments**    ---    1\. 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 Hypertension - I10    ---    3\. Adjustment disorder with depressed mood - F43.21    ---    4\. Encounter for screening colonoscopy - Z12.11    ---    5\. Encounter for screening mammogram for breast cancer - Z12.31    ---    6\. Need for shingles vaccine - Z23    ---    7\. Intertrigo - L30.4    ---    8\. Hyperkalemia - E87.5    ---    9\. Routine adult health maintenance - Z00.00    ---      **Treatment**    ---      **1\. Type 2 diabetes mellitus with diabetic neuropathy, unspecified**    Continue metformin tablet, 1000MG , 1 tab(s), orally, 2 times a day    Continue glimepiride tablet, 4 mg, 1 tab(s), orally, once a day    Notes: Hemoglobin A1C is within the goal. Continue current medications.  Continue Statin. Diabetic diet.    ---        **2\. Hypertension**  Continue metoprolol tablet, 100 mg, 1 tab(s), orally, 2 times a day    Continue losartan tablet, 25 mg, 1 tab(s), orally, once a day    Notes: Controlled. Continue current medication.        **3\. Adjustment disorder with depressed mood**    Continue escitalopram tablet, 10 mg, 1.5 tab(s), orally, once a day, 90 days,  135 Tablet, Refills 0    Notes: Stable. Continue Escitalopram.        **4\. Encounter for screening colonoscopy**    _LAB: INSURE FIT_        **5\. Encounter for screening mammogram for breast cancer**    _IMAGING: [facility] Breast  Screening e-sch*_        **6\. Need for shingles vaccine**    Start Shingrix Vaccine solution, 0.5 ml, 1 dose, IM, 0.5 ml, Refills 1  additional dose in 2-6 months        **7\. Intertrigo**    Continue Lotrisone cream, 0.05%-1%, 1 app, applied topically, 2 times a day,  14 days, 1, Refills 0, Notes: prn        **8\. Hyperkalemia**    _LAB: Potassium Level_ 5.0      Value    Reference Range    ---------    Potassium Lvl    5.0      3.6-5.2 - mmol/[name]      [name] B 06/30/2019 8:53:15 AM >    ------        Notes: Recheck K.        **9\. Routine adult health maintenance**    Notes: Due for colonoscopy- she declines. She will do Insure fit. Due for  mammogram- will book an appt. Flu vaccine given. Script for Shingrix given.      **Preventive Medicine**    ---      Counseling: Vision Care . Dental Care . Diet . Exercise . Calcium and  vitamin D . Sunscreen . Breast self exam .    ---      **Follow Up**    ---    3 Months    Electronically signed by Peggye Form on 07/05/2019 at 05:16 PM EDT    Sign off status: Completed        * * [facility]    [address] [place], [place] [address]    Tel: [phone]    Fax: [phone]              * * *          Progress Note: [name] 06/23/2019    ---    Note generated by eClinicalWorks EMR/PM Software ([email or web address])

## 2019-06-29 ENCOUNTER — Ambulatory Visit (HOSPITAL_BASED_OUTPATIENT_CLINIC_OR_DEPARTMENT_OTHER): Admitting: Family Medicine

## 2019-06-29 NOTE — Progress Notes (Signed)
* * *      [name], [name] **DOB:** 1951-09-14 (68 yo F) **Acc No.** [id number] **DOS:**  06/29/2019    ---        [name], [name]**    ------    52 Y old Female, DOB: [birth year]    [address], [place], [place] [address]    Home: [phone]    Provider: Alfonso Ramus        * * *    Telephone Encounter    ---    Answered by    Elicia Lamp    Date: 06/29/2019        Time: 01:49 PM    Caller    [name] [phone]    ------            Reason    Med and Lab question            Message                      Pt;s dtr [name] left vm asking for the new dose of lexapro to be sent into the pharmacy. Also she would like the results of pt's potassium please and TY [name]                 Action Taken                      Tonilynn Bieker B 06/30/2019 8:53:36 AM > Rx sent for Lexapro. Please let her know K is normal      elliott,tonya  06/30/2019 12:28:49 PM > Spoke to [name] gave her information. She is thankful for the call                    * * *                ---          * * *          Provider: Peggye Form B 06/29/2019    ---    Note generated by eClinicalWorks EMR/PM Software ([email or web address])

## 2019-07-06 ENCOUNTER — Ambulatory Visit (HOSPITAL_BASED_OUTPATIENT_CLINIC_OR_DEPARTMENT_OTHER): Admitting: Family Medicine

## 2019-07-06 NOTE — Progress Notes (Signed)
* * *      [name], [name] **DOB:** 07-23-1951 (68 yo F) **Acc No.** [id number] **DOS:**  07/06/2019    ---        [name], [name]**    ------    26 Y old Female, DOB: 1951/08/23    [address] [place], [place] [address]    Home: [phone]    Provider: Alfonso Ramus        * * *    Telephone Encounter    ---    Answered by    [name]    Date: 07/06/2019        Time: 02:45 PM    Caller    daughter [name] calling    ------            Reason    10/7 - lm for dtr to cb            Message                      states pt tells her that she had a short episode of chest pain once on Sat., once on Sunday, none since.  No other symptoms, no SOB, no arm pain, no nausea.  Wants to know if Dr. Dot Been wants to have her seen, advised that Dr. Dot Been is out on Mondays.      callback # [phone] - daughter [name]                Action Taken                      trask,madison  07/07/2019 12:43:41 PM > Dtr callinng back, please advise. Dtr requesting EKG      Marsden Zaino B 07/07/2019 1:38:26 PM > Pls advise to make appt for evaluation. If she develop persistent chest pain to ED for evaluation      ward,gina  07/07/2019 1:57:25 PM > lmtcb will try a little later      ward,gina  07/07/2019 4:07:13 PM > lm x2      ward,gina  07/08/2019 11:46:04 AM > lm x3 for [name] to call me back                    * * *                ---          * * *          Provider: Peggye Form B 07/06/2019    ---    Note generated by eClinicalWorks EMR/PM Software ([email or web address])

## 2019-07-14 ENCOUNTER — Ambulatory Visit (HOSPITAL_BASED_OUTPATIENT_CLINIC_OR_DEPARTMENT_OTHER): Admitting: Family Medicine

## 2019-07-14 NOTE — Progress Notes (Signed)
* * *      [name], [name] **DOB:** 12/09/1950 (68 yo F) **Acc No.** [id number] **DOS:**  07/14/2019    ---        [name], [name]**    ------    19 Y old Female, DOB: [birth year]    [address] [place], [place] [address]    Home: [phone]    Provider: Alfonso Ramus        * * *    Telephone Encounter    ---    Answered by    Drue Stager    Date: 07/14/2019        Time: 02:48 PM    Caller    Dtr [phone]    ------            Reason    Lamisil            Message                      Dtr calling, Dr. Mahlon Gammon put pt on Lamisil for her toe fungus. Is asking if were ok with this before pt starts.                Action Taken                      trask,madison  07/14/2019 2:50:21 PM > EM off this week, sending to on call to advise.      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  LFTs OK when last checked.   no alcohol on this med      trask,madison  07/14/2019 5:02:21 PM > spoke with dtr, informed of above and agrees.                    * * *                ---          * * *          [name] 07/14/2019    ---    Note generated by eClinicalWorks EMR/PM Software ([email or web address])

## 2019-08-04 ENCOUNTER — Ambulatory Visit

## 2019-08-04 LAB — HX HEPATIC FUNCTION PANEL
CASE NUMBER: 2020308001099
HX ALBUMIN LVL: 3.7 g/dL — NL (ref 3.2–5.0)
HX ALKALINE PHOSPHATASE: 104 U/L — NL (ref 30.0–117.0)
HX ALT: 33 U/L — NL (ref 6.0–55.0)
HX AST: 16 U/L — NL (ref 6.0–40.0)
HX BILIRUBIN DIRECT: 0.1 mg/dL — NL (ref 0.0–0.3)
HX BILIRUBIN TOTAL: 0.3 mg/dL — NL (ref 0.2–1.2)
HX TOTAL PROTEIN: 7 g/dL — NL (ref 6.0–8.4)

## 2019-09-15 ENCOUNTER — Ambulatory Visit (HOSPITAL_BASED_OUTPATIENT_CLINIC_OR_DEPARTMENT_OTHER): Admitting: Family Medicine

## 2019-09-15 NOTE — Progress Notes (Signed)
* * *    [name], [name] **DOB:** 1951-08-17 (68 yo F) **Acc No.** [id number] **DOS:**  09/15/2019    ---        [name], [name]**    ------    3 Y old Female, DOB: Oct 17, 1950    Account Number: [id number]    [address] [place], [id number]    Home: [phone]    Guarantor: [name], Herbert [name] Insurance: Roy[employer] Sinner PLAN (MEDICARE REPLACEMENT  Payer ID: [id number]    External Visit ID: [id number]    Appointment Facility: [facility]        * * *    09/15/2019    Progress notes: [name]    ------    ---        **Current Medications**    ---    Taking      * multivitamin Multiple Vitamins capsule 1 cap(s) orally once a day     ---    * aspirin 81 mg delayed release tablet 1 tab(s) orally once a day     ---    * Freestyle Freedom Lite Lancets as directed 2x a day     ---    * Shingrix Vaccine 0.5 ml solution 1 dose IM     ---    * Freestyle Freedom Lite Test Strips as directed 2x a day     ---    * Prilosec OTC 20 mg delayed release tablet 1 tab(s) orally once a day     ---    * atorvastatin 40 mg tablet 1 tab(s) orally once a day (at bedtime)     ---    * gabapentin 300 mg capsule 1 cap(s) orally 2 times a day     ---    * Lotrisone 0.05%-1% cream 1 app applied topically 2 times a day, Notes: prn     ---    * escitalopram 10 mg tablet 1.5 tab(s) orally once a day     ---    * metformin 1000MG  tablet 1 tab(s) orally 2 times a day     ---    * glimepiride 4 mg tablet 1 tab(s) orally once a day     ---    * metoprolol 100 mg tablet 1 tab(s) orally 2 times a day     ---    * losartan 25 mg tablet 1 tab(s) orally once a day     ---    Not-Taking    * lisinopril 10 mg oral tablet     ---    * Penlac Nail Lacquer 8% solution 1 app applied topically once a day     ---    * naproxen 500 mg tablet 1 tab(s) orally 2 times a day     ---    * Bactrim DS 800 mg-160 mg tablet 1 tab(s) orally every 12 hours     ---    * Shingrix Vaccine 0.5 ml solution 1 dose IM     ---    Unknown    * Keflex 500 mg capsule 1  cap(s) orally every 8 hours     ---    * Medication List reviewed and reconciled with the patient     ---      Past Medical History    ---      HTN.        ---    high cholesterol-TC-162 HDL-50 LDL-76 TG-182 done 2/77/82.        ---  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- Dr Fayrene Fearing Kim.->01/31/16-No DM  retinopathy.        ---    GERD.        ---    BMD-Normal-02/23/14.        ---    Monofilament test-11/18/15-Normal.        ---    Insure Fit-06/13/15- Negative.        ---    Pap-06/13/15 -Negative.        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        ---    Cardiac stress test-03/01/15-positive EKG changes but neg for inducible wall  motion abnormalities.        ---    Lung nodule- following Dr Lattie Corns seen 01/2015.        ---      **Surgical History**    ---      ectopic pregnancies    ---    left ankle screws 2010    ---      **Family History**    ---      Father: deceased 41 yrs, MI, diagnosed with Heart Disease, Hypertension    ---    Mother: deceased 67 yrs, DMII, cirrhoisis of the liver, Diabetes    ---    Sibling 1: sister ovarian/uterus CA, Diabetes, Hypertension    ---    Sibling 2: brother - DMII, HTN, Diabetes, Hypertension    ---    4 brother(s) , 2 sister(s) . 2daughter(s) - healthy.    ---      **Social History**    ---    Sexual History  Had sex in the past 12 months (vaginal, oral, or anal)? No  ,  Have you ever had an STD? No.    Alcohol Smart Form  Did you have a drink containing alcohol in the past year?  No  , Points 0  , Interpretation Negative.    Tobacco Use  Status: never smoker  , Patient counseled on the dangers of  tobacco use: 05/28/2019.    Seat belt: yes.    no Alcohol.    no Drug use.    Diet/Nutrition: Three meals a day, Dairy, Fruits & Vegetables, Protein Foods.    no Exercise.    Marital Status: married, husband - [name].    no Domestic violence.    Children: two Daughters.    Number of household members: 6, self, husband, daughter, son in law, grand  daughter,  grandson.    Occupation: retired.    no Travel outside [place].    Caffeine: coffee in the am.    Home smoke detector use: yes.    Pets: 2 cat, 3 dogs (chihuahuas).    Sexual orientation: heterosexual.    Sexually active: not currently.      **Allergies**    ---      Celexa: hallucinations    ---    lisinopril: cough    ---      **Hospitalization/Major Diagnostic Procedure**    ---      No Hospitalization History.    ---      **Review of Systems**    ---    _CONSTITUTIONAL_ :    no Fever. no Chills. no Sweats.    _OPTHALMOLOGY_ :    no Change in Vision.    _RESPIRATORY_ :    no Shortness of Breath. no DOE (dyspnea on exertion). no Persistent Cough.    _CARDIOLOGY_ :    c/o Chest  Pain,  intermittent  . no Shortness of Breath. no Palpitations. no  Dizziness.    _GASTROENTEROLOGY_ :    no Abdominal Pain. no Nausea. no Vomiting.    _NEUROLOGY_ :    no Headaches.    _DERMATOLOGY_ :    no Rash.            **Reason for Appointment**    ---      1\. 3 month follow-up NF; Resch    ---      **History of Present Illness**    ---    _Lower back_ :    68 year old female presents with c/o low back pain  when moving and doing  housework comes and goes . Resolved with rest  .    Denies : radiation of pain:. Denies : trauma/injury:. Denies : tingling/  numbness:. Denies : weakness. Denies : bowel and bladder incontinence.    _Cardiology_ :    c/o CHEST PAIN  stabbing pain on the chest occurred 2 x in the last 2 weeks  ago Localized in upper chest..Non radiating. No diaphoresis. No shortness of  breath.No dizziness nor lightheadedness. No pain currently  .    Denies : DIZZINESS. Denies : LEG EDEMA. Denies : ORTHOPNEA. Denies :  PALPITATIONS. Denies : SOB.    _Diabetes_ :    c/o DIABETES F/U. c/o COMPLIANCE.    Denies : HOME GLUCOSE MONITORING. Denies : HYPOGLYCEMIC EPISODES. Denies :  POLYURIA. Denies : POLYDYPSIA. Denies : POLYPHAGIA. Denies : WEIGHT GAIN.    HGBA1C  7.5  . LDL  70  . MICROALBUMINURIA  normal  . LAST EYE EXAM  Date  12/29/2018 No retinopathy, Copy of Report on file? Yes. EYE DOCTOR  Dr Su Hilt  . LAST FOOT EXAM Date 09/15/2019. FLU SHOT  received  . PNEUMOVAX  received  .      **Vital Signs**    ---    HR **56** , BP **118/60** , O2 Sat. **99** , Ht 58, Wt **173** , Wt Change  -1.2 lb, BMI  **36.15** .      **Examination**    ---    _General Examination:_    General  alert and oriented  ,  NAD  .    HEENT:  moist mucous membranes  ,  PERRLA  ,  EOMI  .    Heart:  RRR, normal S1S2  ,  no murmurs  .    Lungs:  clear to auscultation bilaterally  ,  no wheezes/rhonchi/rales  .    Abdomen:  soft, NT/ND, BS present  .    Extremities  normal sensation to monofilament testing bilat  .    Neurologic Exam:  No focal neurologic deficit  .    Musculoskeletal  Normal ROM of all joints  .          **Assessments**    ---    1\. 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 Hypertension - I10    ---    3\. Chest pain - R07.9    ---    4\. Low back pain - M54.5    ---    5\. Encounter for immunization - Z23    ---      **Treatment**    ---      **1\. Type 2 diabetes mellitus with diabetic neuropathy, unspecified**    Continue atorvastatin tablet, 40 mg, 1 tab(s), orally, once  a day (at  bedtime), 90 days, 90, Refills 1    Continue gabapentin capsule, 300 mg, 1 cap(s), orally, 2 times a day, 90 days,  180 Capsule, Refills 1    Continue metformin tablet, 1000MG , 1 tab(s), orally, 2 times a day    Continue glimepiride tablet, 4 mg, 1 tab(s), orally, once a day    Notes: Stable. Contine current medications. Diabetic diet. Patient Educated  with: flu_inactive 2020.pdf (flu_inactive 2020.pdf).    ---        **2\. Hypertension**    Continue losartan tablet, 25 mg, 1 tab(s), orally, once a day, 90 days, 90  Tablet, Refills 1    Notes: Controlled. Continue current medications.        **3\. Chest pain**    _IMAGING: EKG_    Notes:    Had 2 episodes of sharp pain in the last 2 weeks lasted for  few minutes. It  does not seem cardiac in nature. No associated symptoms. No pain currently.  EKG is unremarkable. I advised to seek immediate care if pain recurs and  persist.    .        **4\. Low back pain**    Notes: Back pain precipitated with activities such as bending and lifting. No  pain currently. I advised heat or ice . Tylenol as needed. Stretching  exercises.      **Immunization**    ---      Influenza - Hi-dose : 0.5 mL (Dose No:1) (Route: Intramuscular) given by  Sherle Poe on Left Deltoid (Encounter for immunization)    ---      **Procedure Codes**    ---      93000 ECG W/ INTERP    ---    16109 FLU VACC PRSV FREE INC ANTIG    ---    60454 IMMUNIZATION ADMIN    ---      **Follow Up**    ---    prn    Electronically signed by Peggye Form on 09/21/2019 at 08:23 PM EST    Sign off status: Completed        * * [facility]    [address] [place], [place] [address]    Tel: [phone]    Fax: [phone]              * * *          Progress Note: Deandrew Hoecker 09/15/2019    ---    Note generated by eClinicalWorks EMR/PM Software ([email or web address])

## 2019-09-23 ENCOUNTER — Ambulatory Visit (HOSPITAL_BASED_OUTPATIENT_CLINIC_OR_DEPARTMENT_OTHER): Admitting: Family Medicine

## 2019-09-23 NOTE — Progress Notes (Signed)
* * *    [name], [name] **DOB:** 06/21/1951 (68 yo F) **Acc No.** [id number] **DOS:**  09/23/2019    ---        [name], [name]**    ------    68 Y old Female, DOB: 10/22/1950    [address] [place], [place] [address]    Home: [phone]    Provider: Alfonso Ramus        * * *    Web Encounter    ---    Answered by    Staff, Clinical    Date: 09/23/2019        Time: 01:01 PM    Caller    [name]    ------            Reason    low back pain            Message                       Addressed To: [name]                    Hi Dr Dot Been,            this is [name], [name] daughter. She is still c/o low back pain. It is happening when she is active and when she is sitting. It was very painful today while she was sitting and feeding my daughter. Can we possibly send her for an x-ray?            Thank you,            [name].                Action Taken                      [name] 09/23/2019 3:38:25 PM > Hi [name], It is ok for your mom to get an xray. We will fax the order to the radiology at the first floor in our building      [name] 09/23/2019 3:40:28 PM > I handed the order to [facility] to fax the order. Pls fax      [name],[name]  09/23/2019 4:00:41 PM > order faxed                     * * *        **eMessages**   [name]    ------    Created:    2019-09-23 15:39:40.0    Sent:    2019-09-23 15:40:06.0    Subject:    RE:low back pain    Message:                      [name] [name] 09/23/2019 3:38:25 PM > Hi [name], It is ok for your mom to get an xray. We will fax the order to the radiology at the first floor in our building                      * * *        ---        Reason for Appointment    ---      1\. Low back pain    ---      Assessments    ---    1\. Low back pain - M54.5 (Primary)    ---  Treatment    ---      **1\. Low back pain**    _IMAGING: XR Spine Lumbosacral Minimum 4 Views e*_    ---          * * *          [name] 09/23/2019    ---    Note generated by eClinicalWorks EMR/PM Software ([email or web address])

## 2019-09-29 ENCOUNTER — Ambulatory Visit (HOSPITAL_BASED_OUTPATIENT_CLINIC_OR_DEPARTMENT_OTHER): Admitting: Family Medicine

## 2019-09-29 NOTE — Progress Notes (Signed)
* * *      [name], [name] **DOB:** June 06, 1951 (68 yo F) **Acc No.** [id number] **DOS:**  09/29/2019    ---        [name], [name]**    ------    24 Y old Female, DOB: 07-11-1951    [address] [place], [place] [address]    Home: [phone]    Provider: Cheryle Horsfall        * * *    Web Encounter    ---    Answered by    Staff, Clinical    Date: 09/29/2019        Time: 07:46 PM    Caller    Conchetta Prieur    ------            Reason    New Refill Request            Message                      ***Start of Medication List: ***       glimepiride 4 mg 1 tab(s)  orally once a day   with no refill(s)metoprolol 100 mg 1 tab(s)  orally 2 times a day   with no refill(s)escitalopram 10 mg 1.5 tab(s)  orally once a day 90 days  #135 Tablet with no refill(s)      ***End of Med List ***       Comments:                Action Taken                      [name]  09/30/2019 7:29:43 AM >  Forwarding to on-call provider for refill as Dr. Dot Been is out this week.      Jannae Fagerstrom G 09/30/2019 9:09:49 AM >  refills sent                Refills    Refill glimepiride tablet, 4 mg, orally, 90, 1 tab(s), once a day,  Refills=0    ------      Refill escitalopram tablet, 10 mg, orally, 135, 1.5 tab(s), once a day,  Refills=0      Refill metoprolol tablet, 100 mg, orally, 180, 1 tab(s), 2 times a day,  Refills=0          * * *        **eMessages**   From:    Edlyn Rosenburg    ------    Created:    2019-09-30 09:10:07.0    Sent:      Subject:    RE:New Refill Request    Message:                      [name]  09/30/2019 7:29:43 AM >  Forwarding to on-call provider for refill as Dr. Dot Been is out this week.      Jveon Pound G 09/30/2019 9:09:49 AM >  refills sent                        ---          * * *          Provider: Cheryle Horsfall 09/29/2019    ---    Note generated by eClinicalWorks EMR/PM Software ([email or web address])

## 2019-11-30 ENCOUNTER — Ambulatory Visit (HOSPITAL_BASED_OUTPATIENT_CLINIC_OR_DEPARTMENT_OTHER): Admitting: Family Medicine

## 2019-11-30 NOTE — Progress Notes (Signed)
* * *      Nesbit, Karsynn L **DOB:** 1951/02/14 (69 yo F) **Acc No.** [id number] **DOS:**  11/30/2019    ---        [name], Dwayna L**    ------    42 Y old Female, DOB: Mar 30, 1951    [address] [place], [place] [address]    Home: [phone]    Provider: Alfonso Ramus        * * *    Web Encounter    ---    Answered by    Staff, Clinical    Date: 11/30/2019        Time: 01:23 PM    Caller    Jowana Holloway    ------            Reason    New Refill Request            Message                      ***Start of Medication List: ***       metformin 1000MG  1 tab(s)  orally 2 times a day               with no refill(s)      ***End of Med List ***       Comments:                Action Taken                      [name]  11/30/2019 1:47:19 PM > CPE current, has FU appt pending for 03/08/20      Jahiem Franzoni B 12/01/2019 8:32:58 AM > Hi Asya, your refill request has been sent                Refills    Refill metformin tablet, 1000MG , orally, 180 Tablet, 1 tab(s), 2  times a day, 90 days, Refills=0    ------          * * *        **eMessages**   From:    Chun Sellen    ------    Created:    2019-12-01 08:33:35.0    Sent:    2019-12-01 08:33:42.0    Subject:    RE:New Refill Request    Message:                      Alfonso Ramus 12/01/2019 8:32:58 AM > Hi [name], your refill request has been sent                        ---          * * *          Provider: Peggye Form B 11/30/2019    ---    Note generated by eClinicalWorks EMR/PM Software ([email or web address])

## 2019-12-22 ENCOUNTER — Ambulatory Visit: Admitting: Family Medicine

## 2019-12-22 ENCOUNTER — Ambulatory Visit (HOSPITAL_BASED_OUTPATIENT_CLINIC_OR_DEPARTMENT_OTHER): Admitting: Family Medicine

## 2019-12-22 LAB — HX AST
CASE NUMBER: 2021082001468
HX AST: 18 U/L — NL (ref 6.0–40.0)

## 2019-12-22 LAB — HX ALT
CASE NUMBER: 2021082001468
HX ALT: 27 U/L — NL (ref 6.0–55.0)

## 2019-12-29 ENCOUNTER — Ambulatory Visit (HOSPITAL_BASED_OUTPATIENT_CLINIC_OR_DEPARTMENT_OTHER): Admitting: Family Medicine

## 2019-12-29 NOTE — Progress Notes (Signed)
* * *      Hesler, Niamh L **DOB:** 1951-05-25 (69 yo F) **Acc No.** [id number] **DOS:**  12/29/2019    ---        [name], Ona L**    ------    76 Y old Female, DOB: [birth year]    [address] [place], [place] [address]    Home: [phone]    Provider: Alfonso Ramus        * * *    Telephone Encounter    ---    Answered by    Sherle Poe    Date: 12/29/2019        Time: 04:42 PM    Caller    [phone] carla daughter    ------            Reason    rx and xray results            Message                      daughter lvm asking for xray results and refills on glimepiride , gabapentin, ecitalopram                 Action Taken                      [name],[name]  12/29/2019 4:45:28 PM > spoke to carla pt's daughter who state the back xray was ordered a few months ago and went on tuesday . carla states pt's back was getting worse so they went for the xray .      Caren Garske B 12/29/2019 5:33:26 PM > Rxs sent.  Spoke with [employer]. Discussed xray result. I advised PT or see specialist. She will talk to her mom and will call back                Refills    Refill glimepiride tablet, 4 mg, orally, 90 Tablet, 1 tab(s), once  a day, 90 days, Refills=0    ------      Refill escitalopram tablet, 10 mg, orally, 135 Tablet, 1.5 tab(s), once a  day, 90 days, Refills=0      Refill gabapentin capsule, 300 mg, orally, 180 Capsule, 1 cap(s), 2 times a  day, 90 days, Refills=1          * * *                ---          * * *          Provider: Peggye Form B 12/29/2019    ---    Note generated by eClinicalWorks EMR/PM Software ([email or web address])

## 2019-12-31 ENCOUNTER — Ambulatory Visit (HOSPITAL_BASED_OUTPATIENT_CLINIC_OR_DEPARTMENT_OTHER): Admitting: Family Medicine

## 2019-12-31 NOTE — Progress Notes (Signed)
* * *      [name], [name] **DOB:** August 09, 1951 (69 yo F) **Acc No.** [id number] **DOS:**  12/31/2019    ---        Edward [name], [name]**    ------    9 Y old Female, DOB: [birth year]    [address], [place], [place] [address]    Home: [phone]    Provider: Alfonso [name]        * * *    Web Encounter    ---    Answered by    Staff, Clinical    Date: 12/31/2019        Time: 05:59 PM    Caller    [name]    ------            Reason    New Refill Request            Message                      ***Start of Medication List: ***       metoprolol 100 mg 1 tab(s)  orally 2 times a day               #180 with no refill(s)      ***End of Med List ***       Comments:                Action Taken                      [name]  01/01/2020 7:35:59 AM > CPE current, FU appt pending for 03/08/20      [name] 01/03/2020 5:11:56 PM > Hi [name]! your refill request has been sent                Refills    Refill metoprolol tablet, 100 mg, orally, 180 Tablet, 1 tab(s), 2  times a day, 90 days, Refills=0    ------          * * *        **eMessages**   [name]    ------    Created:    2020-01-03 17:14:14.0    Sent:      Subject:    RE:New Refill Request    Message:                      [name] 01/03/2020 5:11:56 PM > Hi [name]! your refill request has been sent                        ---          * * *          [name] 12/31/2019    ---    Note generated by eClinicalWorks EMR/PM Software ([email or web address])

## 2020-01-06 ENCOUNTER — Ambulatory Visit (HOSPITAL_BASED_OUTPATIENT_CLINIC_OR_DEPARTMENT_OTHER): Admitting: Family Medicine

## 2020-01-06 NOTE — Progress Notes (Signed)
* * *    Christen, Korianna L **DOB:** 25-Jan-1951 (69 yo F) **Acc No.** [id number] **DOS:**  01/06/2020    ---        [name], Khiya L**    ------    30 Y old Female, DOB: 1951-04-05    [address] [place], [place] [address]    Home: [phone]    Provider: Alfonso Ramus        * * *    Web Encounter    ---    Answered by    Staff, Clinical    Date: 01/06/2020        Time: 08:55 AM    Caller    Cayla Lembke    ------            Reason    ortho referral            Message                       Addressed To: Peggye Form B                    Hello, Is someone working on this for my mom? I sent a msg on 01/01/2020 and want to make sure it is being worked on.            Thank you,            [name].            Below is the msg.            Hi Dr. Dot Been, My mom Keeleigh does want to see ortho Dr. Wilford Grist. If you could send paperwork to him. Best days for [place] is Monday/Weds after 2pm. or Tuesday mornings. Thank you so much, [name]                Action Taken                      [facility]  01/06/2020 10:57:05 AM > Hello,            The appointment with Dr. Nona Dell is scheduled for 01/18/2020 @ 3:20pm. The did not have anything sooner or on the 20th, I'm sorry. Dr. Nona Dell is located at [address], [address] [place] and their phone number is [phone] if you need to reschedule.                     * * *        **eMessages**   From:    Phing,Molyda    ------    Created:    2020-01-06 10:57:03.0    Sent:      Subject:    ID:[id number] referral    Message:    Hello,        The appointment with Dr. Nona Dell is scheduled for 01/18/2020 @ 3:20pm. The did  not have anything sooner or on the 20th, I'm sorry. Dr. Nona Dell is located at [address] [place] and their phone number is [phone] if you  need to reschedule.                    ---          * * *          Provider: Peggye Form B 01/06/2020    ---    Note generated by eClinicalWorks  EMR/PM Software ([email or web address])

## 2020-01-06 NOTE — Progress Notes (Signed)
* * *      [name], [name] **DOB:** 02-23-1951 (69 yo F) **Acc No.** [id number] **DOS:**  01/06/2020    ---        [name], [name]**    ------    55 Y old Female, DOB: 05/07/1951    [address], [place], [place] [address]    Home: [phone]    Provider: Alfonso Ramus        * * *    Web Encounter    ---    Answered by    Staff, Clinical    Date: 01/06/2020        Time: 01:43 PM    Caller    Kathryne Wehmeyer    ------            Reason    Re:RE:ortho referral            Message                      Thank you for scheduling appt!                        -[name].                                                _____________________________________________________________________            To :[name]            From :Clinical Staff            Sent :01/06/2020 10:57 AM            Subject :UQ:JFHLK referral                        Hello, The appointment with Dr. Nona Dell is scheduled for 01/18/2020 @ 3:20pm. The did not have anything sooner or on the 20th, I'm sorry. Dr. Nona Dell is located at [address] [place] and their phone number is [phone] if you need to reschedule.                            Action Taken                      Phing,Molyda  01/06/2020 2:15:30 PM > You're welcome!                    * * *        **eMessages**   From:    Phing,Molyda    ------    Created:    2020-01-06 14:15:28.0    Sent:      Subject:    RE:Re:RE:ortho referral    Message:    You're welcome!                    ---          * * *          Provider: Peggye Form B 01/06/2020    ---    Note generated by eClinicalWorks EMR/PM Software ([email or web address])

## 2020-01-12 ENCOUNTER — Ambulatory Visit: Admitting: Specialist

## 2020-02-08 ENCOUNTER — Ambulatory Visit: Admitting: Specialist

## 2020-02-26 ENCOUNTER — Ambulatory Visit (HOSPITAL_BASED_OUTPATIENT_CLINIC_OR_DEPARTMENT_OTHER): Admitting: Family

## 2020-02-26 NOTE — Progress Notes (Signed)
* * *      [name], [name] **DOB:** 30-Mar-1951 (69 yo F) **Acc No.** [id number] **DOS:**  02/26/2020    ---        [name], [name]**    ------    36 Y old Female, DOB: 1951-03-04    [address] [place], [place] [address]    Home: [phone]    Provider: Mirna Mires        * * *    Web Encounter    ---    Answered by    Staff, Clinical    Date: 02/26/2020        Time: 03:45 PM    Caller    [name]    ------            Reason    New Refill Request            Message                      ***Start of Medication List: ***       metformin 1000MG  1 tab(s)  orally 2 times a day 90 days              #180 Tablet with no refill(s)      ***End of Med List ***       Comments:                Action Taken                      [name]  02/26/2020 4:47:41 PM > last A1C 06/16/2019.  FU appt pending for 03/08/20.  Can I send to you for refill, thanks.      [name]  02/26/2020 5:42:11 PM > rx sent                Refills    Refill metformin tablet, 1000MG , orally, 180 Tablet, 1 tab(s), 2  times a day, 90 days, Refills=0    ------          * * *                ---          * * *          Provider: Mirna Mires 02/26/2020    ---    Note generated by eClinicalWorks EMR/PM Software ([email or web address])

## 2020-03-08 ENCOUNTER — Ambulatory Visit (HOSPITAL_BASED_OUTPATIENT_CLINIC_OR_DEPARTMENT_OTHER): Admitting: Family Medicine

## 2020-03-08 NOTE — Progress Notes (Signed)
* * *      [name], [name] **DOB:** 1951-04-02 (69 yo F) **Acc No.** [id number] **DOS:**  03/08/2020    ---        [name], [name]**    ------    37 Y old Female, DOB: 1950/11/11    [address] [place], [place] [address]    Home: [phone]    Provider: Alfonso Ramus        * * *    Web Encounter    ---    Answered by    Staff, Clinical    Date: 03/08/2020        Time: 02:27 PM    Caller    [name]    ------            Reason    New medication            Message                       Addressed To: [name]                    Hi. The [place] is not covered  by insurance. It is too expensive.                 Action Taken                      [name] 03/09/2020 5:37:35 PM > Hi [name], I sent a different medication called Pioglitazone to your pharmacy. If you have any concern please let me know.                 Refills    Start Pioglitazone Hydrochloride tablet, 15 mg, orally, 30, 1  tab(s), once a day, 30 day(s), Refills=1    ------          * * *        **eMessages**   [name]    ------    Created:    2020-03-09 17:38:32.0    Sent:      Subject:    RE:New medication    Message:                      Alfonso Ramus 03/09/2020 5:37:35 PM > Hi [name], I sent a different medication called Pioglitazone to your pharmacy. If you have any concern please let me know.                        ---          * * *          [name] 03/08/2020    ---    Note generated by eClinicalWorks EMR/PM Software ([email or web address])

## 2020-03-08 NOTE — Progress Notes (Signed)
* * *    [name], [name] **DOB:** [birth year] (69 yo F) **Acc No.** [id number] **DOS:**  03/08/2020    ---        [name], [name]**    ------    61 Y old Female, DOB: 10-17-1950    Account Number: [id number]    [address], [place], [place]    Home: [phone]    Guarantor: [name] Insurance: [employer] PLAN (MEDICARE REPLACEMENT  Payer ID: [id number]    External Visit ID: [id number]    Appointment Facility: [facility]        * * *    03/08/2020    Progress Notes: [name]    ------    ---        **Current Medications**    ---    Taking      * atorvastatin 40 mg tablet 1 tab(s) orally once a day (at bedtime)     ---    * losartan 25 mg tablet 1 tab(s) orally once a day     ---    * multivitamin Multiple Vitamins capsule 1 cap(s) orally once a day     ---    * aspirin 81 mg delayed release tablet 1 tab(s) orally once a day     ---    * Freestyle Freedom Lite Lancets as directed 2x a day     ---    * Shingrix Vaccine 0.5 ml solution 1 dose IM     ---    * Freestyle Freedom Lite Test Strips as directed 2x a day     ---    * PriLOSEC OTC 20 mg delayed release tablet 1 tab(s) orally once a day     ---    * Lotrisone 0.05%-1% cream 1 app applied topically 2 times a day, Notes: prn     ---    * glimepiride 4 mg tablet 1 tab(s) orally once a day     ---    * escitalopram 10 mg tablet 1.5 tab(s) orally once a day     ---    * gabapentin 300 mg capsule 1 cap(s) orally 2 times a day     ---    * metoprolol 100 mg tablet 1 tab(s) orally 2 times a day     ---    * metFORMIN 1000MG  tablet 1 tab(s) orally 2 times a day     ---    Not-Taking    * lisinopril 10 mg oral tablet     ---    * Penlac Nail Lacquer 8% solution 1 app applied topically once a day     ---    * naproxen 500 mg tablet 1 tab(s) orally 2 times a day     ---    * Bactrim DS 800 mg-160 mg tablet 1 tab(s) orally every 12 hours     ---    * Shingrix Vaccine 0.5 ml solution 1 dose IM     ---    Unknown    * Keflex 500 mg capsule 1  cap(s) orally every 8 hours     ---    * Medication List reviewed and reconciled with the patient     ---      Past Medical History    ---      HTN.        ---    high cholesterol-TC-162 HDL-50 LDL-76 TG-182 done 01/12/23.        ---  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- Dr Fayrene Fearing Kim.->01/31/16-No DM  retinopathy.        ---    GERD.        ---    BMD-Normal-02/23/14.        ---    Monofilament test-11/18/15-Normal.        ---    Insure Fit-06/13/15- Negative.        ---    Pap-06/13/15 -Negative.        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        ---    Cardiac stress test-03/01/15-positive EKG changes but neg for inducible wall  motion abnormalities.        ---    Lung nodule- following Dr Lattie Corns seen 01/2015.        ---      **Surgical History**    ---      ectopic pregnancies    ---    left ankle screws 2010    ---      **Family History**    ---      Father: deceased 19 yrs, MI, diagnosed with Heart Disease, Hypertension    ---    Mother: deceased 23 yrs, DMII, cirrhoisis of the liver, Diabetes    ---    Sibling 1: sister ovarian/uterus CA, Diabetes, Hypertension    ---    Sibling 2: brother - DMII, HTN, Diabetes, Hypertension    ---    4 brother(s) , 2 sister(s) . 2daughter(s) - healthy.    ---      **Social History**    ---    Sexual History  Had sex in the past 12 months (vaginal, oral, or anal)? No  ,  Have you ever had an STD? No.    Alcohol Smart Form  Did you have a drink containing alcohol in the past year?  No  , Points 0  , Interpretation Negative.    Tobacco Use  Status: never smoker  , Patient counseled on the dangers of  tobacco use: 05/28/2019.    Seat belt: yes.    no Alcohol.    no Drug use.    Diet/Nutrition: Three meals a day, Dairy, Fruits & Vegetables, Protein Foods.    no Exercise.    Marital Status: married, husband - Antonio.    no Domestic violence.    Children: two Daughters.    Number of household members: 6, self, husband, daughter, son in law, grand  daughter,  grandson.    Occupation: retired.    no Travel outside [place].    Caffeine: coffee in the am.    Home smoke detector use: yes.    Pets: 2 cat, 3 dogs (chihuahuas).    Sexual orientation: heterosexual.    Sexually active: not currently.      **Allergies**    ---      Celexa: hallucinations    ---    lisinopril: cough    ---      **Hospitalization/Major Diagnostic Procedure**    ---      No Hospitalization History.    ---      **Review of Systems**    ---    _CONSTITUTIONAL_ :    no Fever. no Chills. no Sweats.    _RESPIRATORY_ :    no Shortness of Breath. no DOE (dyspnea on exertion).    _CARDIOLOGY_ :    no Chest Pain.    _GASTROENTEROLOGY_ :    no Abdominal Pain. no Nausea. no  Vomiting.    _MUSCULOSKELETAL_ :    c/o Joint Pain,  knee  .    _NEUROLOGY_ :    no Headaches.    _DERMATOLOGY_ :    no Rash.            **Reason for Appointment**    ---      1\. f/u DM    ---      **History of Present Illness**    ---    _Diabetes_ :    69 year old female presents with c/o DIABETES F/U. c/o HOME GLUCOSE MONITORING  AM fasting:140-170  . c/o COMPLIANCE.    Denies : HYPOGLYCEMIC EPISODES. Denies : POLYURIA. Denies : POLYDYPSIA. Denies  : POLYPHAGIA. Denies : WEIGHT GAIN.    HGBA1C  8.2  . LDL  70  . MICROALBUMINURIA  borderline high  . LAST EYE EXAM  Date 12/29/2018 No retinopathy, Copy of Report on file? Yes. EYE DOCTOR  Dr  Su Hilt  . LAST FOOT EXAM Date 09/15/2019. FLU SHOT  received  . PNEUMOVAX  received  .    _Sick_ :    Knees feels giving out when walking and standing. Denies pain and stiffness.  No numbness and tingling.    _Hypertension_ :    c/o HYPERTENSION F/U. c/o MED COMPLIANCE.    Denies : HOME BP CHECKS. Denies : MED SIDE EFFECTS. Denies : CHEST PAIN.  Denies : DIZZINESS. Denies : LEG EDEMA. Denies : ORTHOPNEA. Denies :  PALPITATIONS. Denies : SOB. Denies : SYNCOPE.      **Vital Signs**    ---    HR **55** , BP **132/60** , O2 Sat. **97** , Ht 58, Wt **171** , Wt Change -2  lb, BMI  **35.74** .       **Examination**    ---    _General Examination:_    General  alert and oriented  ,  NAD  .    HEENT:  moist mucous membranes  ,  PERRLA  ,  EOMI  .    Heart:  RRR, normal S1S2  ,  no murmurs  .    Lungs:  clear to auscultation bilaterally  ,  no wheezes/rhonchi/rales  .    Extremities  no edema  ,  normal sensation to monofilament testing bilat  .    Neurologic Exam:  No focal neurologic deficit  .    Musculoskeletal  Examination of the knee showed no edema and no erythema.  Negative Murphys sign.Negative anterior drawer sign.  .          **Assessments**    ---    1\. 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 Knee buckling - M25.369    ---    3\. Hypertension - I10    ---    4\. Adjustment disorder with depressed mood - F43.21    ---      **Treatment**    ---      **1\. Type 2 diabetes mellitus with diabetic neuropathy, unspecified**    Start Januvia tablet, 25 mg, 1 tab(s), orally, once a day, 30 day(s), 30,  Refills 2    Continue glimepiride tablet, 4 mg, 1 tab(s), orally, once a day    Continue metFORMIN tablet, 1000MG , 1 tab(s), orally, 2 times a day    _LAB: Hemoglobin A1C_     Value    Reference Range    ---------  A1C    8.2        _LAB: Microalbumin Urine_   Value    Reference Range    ---------    Microalbumin    30        Creatinine    200        ------------        Notes: Uncontrolled. Hemoglobin A1C higher than last time. I will add Januvia.  Continue Metformin and Glimeperide. I discussed low carb diet and regular  exercise. Has appt for eye exam on 05/03/20. Urine microalbumin is borderline  high. Patient is on Lisinopril for renal protection. Continue Statin.        **2\. Knee buckling**    Notes: I will refer her to othopedic.        **3\. Hypertension**    Continue losartan tablet, 25 mg, 1 tab(s), orally, once a day    Continue lisinopril 10 mg oral tablet    Continue metoprolol tablet, 100 mg, 1 tab(s), orally, 2 times  a day    Notes: Controlled. Continue current medication.        **4\. Adjustment disorder with depressed mood**    Continue escitalopram tablet, 10 mg, 1.5 tab(s), orally, once a day    Notes: Stable. Continue current medication. Counseling given.      **Procedure Codes**    ---      [id number] BLOOD DRAWING    ---    60454 GLYCATED HEMOGLOBIN TEST    ---    09811 ASSAY OF URINE CREATININE    ---    91478 MICROALBUMIN, SEMIQUANT    ---      **Follow Up**    ---    6 Months    Electronically signed by Peggye Form on 03/19/2020 at 05:35 PM EDT    Sign off status: Completed        * * [facility]    [address] [place], [place] [address]    Tel: [phone]    Fax: [phone]              * * *          Progress Note: [name] 03/08/2020    ---    Note generated by eClinicalWorks EMR/PM Software ([email or web address])

## 2020-03-09 ENCOUNTER — Ambulatory Visit (HOSPITAL_BASED_OUTPATIENT_CLINIC_OR_DEPARTMENT_OTHER): Admitting: Family Medicine

## 2020-03-09 NOTE — Progress Notes (Signed)
* * *      [name], [name] **DOB:** [birth year] (69 yo F) **Acc No.** [id number] **DOS:**  03/09/2020    ---        Edward [name], [name]**    ------    39 Y old Female, DOB: [birth year]    [address] [place], [place] [address]    Home: [phone]    Provider: Alfonso Ramus        * * *    Web Encounter    ---    Answered by    Staff, Clinical    Date: 03/09/2020        Time: 07:01 PM    Caller    [name]    ------            Reason    Re:RE:New medication            Message                                                     Okay thank you. Do I continue metformin and glimepiride?                        _____________________________________________________________________            To :[name]            From :Clinical Staff            Sent :03/09/2020 05:39 PM            Subject :RE:New medication                        [name] 03/09/2020 5:37:35 PM > Hi [name], I sent a different medication called Pioglitazone to your pharmacy. If you have any concern please let me know.                            Action Taken                      [name] 03/10/2020 10:57:05 AM > Yes                    * * *        **eMessages**   From:    [name]    ------    Created:    2020-03-10 10:57:14.0    Sent:      Subject:    RE:Re:RE:New medication    Message:                      [name] 03/10/2020 10:57:05 AM > Yes                        ---          * * *          Provider: Peggye Form B 03/09/2020    ---    Note generated by eClinicalWorks EMR/PM Software ([email or web address])

## 2020-03-24 ENCOUNTER — Ambulatory Visit (HOSPITAL_BASED_OUTPATIENT_CLINIC_OR_DEPARTMENT_OTHER): Admitting: Family Medicine

## 2020-03-24 NOTE — Progress Notes (Signed)
* * *      Smedberg, Davette L **DOB:** [birth year] (69 yo F) **Acc No.** [id number] **DOS:**  03/24/2020    ---        [name], Lizbeth L**    ------    60 Y old Female, DOB: 06/13/1951    [address], [place], [place] [address]    Home: [phone]    Provider: Alfonso Ramus        * * *    Web Encounter    ---    Answered by    Staff, Clinical    Date: 03/24/2020        Time: 01:39 PM    Caller    Marlana Plant    ------            Reason    New Refill Request            Message                      ***Start of Medication List: ***       losartan 25 mg 1 tab(s)  orally once a day               with no refill(s)      ***End of Med List ***       Comments:                Action Taken                      Ryker Sudbury B 03/24/2020 6:06:48 PM > Hi Leesa, your refill request has been sent                Refills    Refill losartan tablet, 25 mg, orally, 90 Tablet, 1 tab(s), once a  day, 90 days, Refills=1    ------          * * *        **eMessages**   From:    Dorn Hartshorne    ------    Created:    2020-03-24 18:08:05.0    Sent:      Subject:    RE:New Refill Request    Message:                      Alfonso Ramus 03/24/2020 6:06:48 PM > Hi [name], your refill request has been sent                        ---          * * *          Provider: Peggye Form B 03/24/2020    ---    Note generated by eClinicalWorks EMR/PM Software ([email or web address])

## 2020-03-29 ENCOUNTER — Ambulatory Visit (HOSPITAL_BASED_OUTPATIENT_CLINIC_OR_DEPARTMENT_OTHER): Admitting: Physician Assistant

## 2020-03-29 NOTE — Progress Notes (Signed)
* * *    [name], [name] **DOB:** Nov 25, 1950 (69 yo F) **Acc No.** [id number] **DOS:**  03/29/2020    ---        [name], [name]**    ------    69 Y old Female, DOB: 12-30-1950    [address] [place], [place] [address]    Home: [phone]    Provider: Zelphia Cairo        * * *    Web Encounter    ---    Answered by    Staff, Clinical    Date: 03/29/2020        Time: 07:13 PM    Caller    [name]    ------            Reason    New Refill Request            Message                      ***Start of Medication List: ***       gabapentin 300 mg 1 cap(s)  orally 2 times a day 90 days              #180 Capsule  with 1 refill(s)            atorvastatin 40 mg 1 tab(s)  orally once a day (at bedtime) 90 days              #90  with 1 refill(s)            escitalopram 10 mg 1.5 tab(s)  orally once a day               with no refill(s)            metoprolol 100 mg 1 tab(s)  orally 2 times a day               with no refill(s)            metFORMIN 1000MG  1 tab(s)  orally 2 times a day               with no refill(s)            glimepiride 4 mg 1 tab(s)  orally once a day               with no refill(s)      ***End of Med List ***       Comments: please add with refills                Action Taken                      [name]  03/30/2020 7:46:15 AM > checked [facility], last script for gabapentin written 09/15/19, last filled 12/29/19.  Last FU appt 03/08/20, has CPE appt pending in September.  Can I send to you for refill as Dr. Dot Been is out, thanks.      [name]  03/30/2020 8:04:19 AM > rx sent      [name]  03/30/2020 8:54:04 AM > pt notified of same via portal message.                Refills    Refill metoprolol tablet, 100 mg, orally, 180 Tablet, 1 tab(s), 2  times a day, 90 days, Refills=0    ------      Refill escitalopram tablet, 10 mg, orally,  135 Tablet, 1.5 tab(s), once a  day, 90 days, Refills=0      Refill glimepiride tablet, 4 mg, orally, 90 Tablet, 1 tab(s), once a  day,  90 days, Refills=0      Refill metFORMIN tablet, 1000MG , orally, 180 Tablet, 1 tab(s), 2 times a  day, 90 days, Refills=0      Refill atorvastatin tablet, 40 mg, orally, 90, 1 tab(s), once a day (at  bedtime), 90 days, Refills=1      Refill gabapentin capsule, 300 mg, orally, 60, 1 cap(s), 2 times a day, 30  days, Refills=0          * * *        **eMessages**   [name]    ------    Created:    2020-03-30 08:54:00.0    Sent:      Subject:    RE:New Refill Request    Message:    [name], Your refills were sent in to the pharmacy this morning,  you can check with the pharmacy to make sure they are ready to be picked up.        Thanks,    [name] Lynn, RN-BSN                  * * *        ---        Reason for Appointment    ---      1\. New Refill Request    ---      Current Medications    ---    Taking      * atorvastatin 40 mg tablet 1 tab(s) orally once a day (at bedtime)     ---    * multivitamin Multiple Vitamins capsule 1 cap(s) orally once a day     ---    * aspirin 81 mg delayed release tablet 1 tab(s) orally once a day     ---    * Freestyle Freedom Lite Lancets as directed 2x a day     ---    * Shingrix Vaccine 0.5 ml solution 1 dose IM     ---    * Freestyle Freedom Lite Test Strips as directed 2x a day     ---    * PriLOSEC OTC 20 mg delayed release tablet 1 tab(s) orally once a day     ---    * Lotrisone 0.05%-1% cream 1 app applied topically 2 times a day, Notes: prn     ---    * gabapentin 300 mg capsule 1 cap(s) orally 2 times a day     ---    * Januvia 25 mg tablet 1 tab(s) orally once a day     ---    * Pioglitazone Hydrochloride 15 mg tablet 1 tab(s) orally once a day     ---    * glimepiride 4 mg tablet 1 tab(s) orally once a day     ---    * metFORMIN 1000MG  tablet 1 tab(s) orally 2 times a day     ---    * lisinopril 10 mg oral tablet     ---    * metoprolol 100 mg tablet 1 tab(s) orally 2 times a day     ---    * escitalopram 10 mg tablet 1.5 tab(s)  orally once a day     ---    * losartan 25 mg tablet 1 tab(s)  orally once a day     ---    Not-Taking    * Penlac Nail Lacquer 8% solution 1 app applied topically once a day     ---    * naproxen 500 mg tablet 1 tab(s) orally 2 times a day     ---    * Bactrim DS 800 mg-160 mg tablet 1 tab(s) orally every 12 hours     ---    * Shingrix Vaccine 0.5 ml solution 1 dose IM     ---    Unknown    * Keflex 500 mg capsule 1 cap(s) orally every 8 hours     ---      Assessments    ---    1\. 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 Hypertension - I10    ---    3\. Adjustment disorder with depressed mood - F43.21    ---      Treatment    ---      **1\. Type 2 diabetes mellitus with diabetic neuropathy, unspecified**    Refill glimepiride tablet, 4 mg, 1 tab(s), orally, once a day, 90 days, 90  Tablet, Refills 0    Refill metFORMIN tablet, 1000MG , 1 tab(s), orally, 2 times a day, 90 days, 180  Tablet, Refills 0    Refill atorvastatin tablet, 40 mg, 1 tab(s), orally, once a day (at bedtime),  90 days, 90, Refills 1    Refill gabapentin capsule, 300 mg, 1 cap(s), orally, 2 times a day, 30 days,  60, Refills 0    ---        **2\. Hypertension**    Refill metoprolol tablet, 100 mg, 1 tab(s), orally, 2 times a day, 90 days,  180 Tablet, Refills 0        **3\. Adjustment disorder with depressed mood**    Refill escitalopram tablet, 10 mg, 1.5 tab(s), orally, once a day, 90 days,  135 Tablet, Refills 0          * * *          Provider: Zelphia Cairo 03/29/2020    ---    Note generated by eClinicalWorks EMR/PM Software ([email or web address])

## 2020-03-30 ENCOUNTER — Ambulatory Visit

## 2020-05-09 ENCOUNTER — Ambulatory Visit (HOSPITAL_BASED_OUTPATIENT_CLINIC_OR_DEPARTMENT_OTHER): Admitting: Family Medicine

## 2020-05-09 NOTE — Progress Notes (Signed)
* * *      [name], [name] **DOB:** [birth year] (69 yo F) **Acc No.** [id number] **DOS:**  05/09/2020    ---        [name], [name]**    ------    34 Y old Female, DOB: 12-22-1950    [address] [place], [place] [address]    Home: [phone]    Provider: Alfonso Ramus        * * *    Web Encounter    ---    Answered by    Staff, Clinical    Date: 05/09/2020        Time: 09:51 AM    Caller    [name]    ------            Reason    New Refill Request            Message                      ***Start of Medication List: ***       Pioglitazone Hydrochloride 15 mg 1 tab(s)  orally once a day 30 day(s)              #30  with 1 refill(s)      ***End of Med List ***       Comments:                Action Taken                      fernandes,sahrena  05/09/2020 10:39:04 AM > last rx 03/08/20, rx sent                Refills    Refill Pioglitazone Hydrochloride tablet, 15 mg, orally, 30, 1  tab(s), once a day, 30 day(s), Refills=3    ------          * * *                ---          * * *          Provider: Peggye Form B 05/09/2020    ---    Note generated by eClinicalWorks EMR/PM Software ([email or web address])

## 2020-05-12 ENCOUNTER — Ambulatory Visit (HOSPITAL_BASED_OUTPATIENT_CLINIC_OR_DEPARTMENT_OTHER): Admitting: Family Medicine

## 2020-05-12 NOTE — Progress Notes (Signed)
* * *      [name], [name] **DOB:** Apr 04, 1951 (69 yo F) **Acc No.** [id number] **DOS:**  05/12/2020    ---        [name], [name]**    ------    54 Y old Female, DOB: September 24, 1951    [address] [place], [place] [address]    Home: [phone]    Provider: Alfonso [name]        * * *    Web Encounter    ---    Answered by    Staff, Clinical    Date: 05/12/2020        Time: 04:53 PM    Caller    [name]    ------            Reason    New Refill Request            Message                      ***Start of Medication List: ***       gabapentin 300 mg 1 cap(s)  orally 2 times a day 30 days              #60 with no refill(s)      ***End of Med List ***       Comments:                Action Taken                      [name]  05/13/2020 7:04:45 AM > checked [facility], last script written and filled 03/30/20.  Last FU appt 03/08/20, has CPE appt pending for 07/05/20.      [name] 05/13/2020 1:33:35 PM > Hi [name], your refill request has been sent                Refills    Refill gabapentin capsule, 300 mg, orally, 60, 1 cap(s), 2 times a  day, 30 days, Refills=0    ------          * * *        **eMessages**   [name]    ------    Created:    2020-05-13 13:34:00.0    Sent:    2020-05-13 13:34:02.0    Subject:    RE:New Refill Request    Message:                      Alfonso [name] 05/13/2020 1:33:35 PM > Hi [name], your refill request has been sent                        ---          * * *          [name] 05/12/2020    ---    Note generated by eClinicalWorks EMR/PM Software ([email or web address])

## 2020-05-27 ENCOUNTER — Ambulatory Visit (HOSPITAL_BASED_OUTPATIENT_CLINIC_OR_DEPARTMENT_OTHER): Admitting: Family

## 2020-05-27 NOTE — Progress Notes (Signed)
* * *      Wentzell, Ronnae L **DOB:** 02/19/1951 (69 yo F) **Acc No.** [id number] **DOS:**  05/27/2020    ---        [name], Jamelah L**    ------    50 Y old Female, DOB: [birth year]    [address] [place], [place] [address]    Home: [phone]    Provider: Mirna Mires        * * *    Web Encounter    ---    Answered by    Staff, Clinical    Date: 05/27/2020        Time: 05:53 PM    Caller    Ahmiyah Fayson    ------            Reason    New Refill Request            Message                      ***Start of Medication List: ***       metFORMIN 1000MG  1 tab(s)  orally 2 times a day 90 days              #180 Tablet with no refill(s)      ***End of Med List ***       Comments:                Action Taken                      [name]  05/30/2020 7:38:21 AM > last A1C 03/08/20.  CPE pending for 07/05/20.  Can I send to you for refill as Dr. Dot Been is off this week, thanks.      Virgle Arth  05/30/2020 8:08:25 AM > rx sent                Refills    Refill metFORMIN tablet, 1000MG , orally, 180 Tablet, 1 tab(s), 2  times a day, 90 days, Refills=0    ------          * * *                ---          * * *          Provider: Mirna Mires 05/27/2020    ---    Note generated by eClinicalWorks EMR/PM Software ([email or web address])

## 2020-06-23 ENCOUNTER — Ambulatory Visit (HOSPITAL_BASED_OUTPATIENT_CLINIC_OR_DEPARTMENT_OTHER): Admitting: Family Medicine

## 2020-06-23 NOTE — Progress Notes (Signed)
* * *      [name], [name] **DOB:** [birth year] (69 yo F) **Acc No.** [id number] **DOS:**  06/23/2020    ---        [name], [name]**    ------    69 Y old Female, DOB: 09/26/1951    [address], [place], [place] [address]    Home: [phone]    Provider: Alfonso Ramus        * * *    Web Encounter    ---    Answered by    Staff, Clinical    Date: 06/23/2020        Time: 06:28 PM    Caller    Ranay Moeser    ------            Reason    New Refill Request            Message                      ***Start of Medication List: ***       gabapentin 300 mg 1 cap(s)  orally 2 times a day 30 days              #60 with no refill(s)      ***End of Med List ***       Comments:                Action Taken                      Maple Hudson, RN,[name] 06/24/2020 6:56:26 AM > per [facility] rx last filled 05/13/2020 for #60.      Last FU 03/08/2020, cpe scheduled for 07/05/2020.      Navon Kotowski B 06/24/2020 9:43:35 AM > Your refill request has been sent                Refills    Refill gabapentin capsule, 300 mg, orally, 60, 1 cap(s), 2 times a  day, 30 days, Refills=0    ------          * * *        **eMessages**   From:    Faron Tudisco    ------    Created:    2020-06-24 09:43:54.0    Sent:      Subject:    RE:New Refill Request    Message:                      Alfonso Ramus 06/24/2020 9:43:35 AM > Your refill request has been sent                        ---          * * *          Provider: Peggye Form B 06/23/2020    ---    Note generated by eClinicalWorks EMR/PM Software ([email or web address])

## 2020-07-01 ENCOUNTER — Ambulatory Visit (HOSPITAL_BASED_OUTPATIENT_CLINIC_OR_DEPARTMENT_OTHER): Admitting: Family

## 2020-07-01 NOTE — Progress Notes (Signed)
* * *      [name], [name] **DOB:** 1951/07/26 (69 yo F) **Acc No.** [id number] **DOS:**  07/01/2020    ---        [name], [name]**    ------    10 Y old Female, DOB: 1951-06-03    [address] [place], [place] [address]    Home: [phone]    Provider: Rubbie Battiest        * * *    Web Encounter    ---    Answered by    Staff, Clinical    Date: 07/01/2020        Time: 08:30 AM    Caller    [name]    ------            Reason    New Refill Request            Message                      ***Start of Medication List: ***       escitalopram 10 mg 1.5 tab(s)  orally once a day 90 days              #135 Tablet with no refill(s)            metoprolol 100 mg 1 tab(s)  orally 2 times a day 90 days              #180 Tablet with no refill(s)      ***End of Med List ***       Comments:                Action Taken                      [name]  07/01/2020 8:38:07 AM > CPE appt pending for 07/05/20, can I send to you for refill as Dr. Dot Been is out of the office until next week, thanks.      [name]  07/01/2020 1:38:35 PM > rx sent                Refills    Refill escitalopram tablet, 10 mg, orally, 135 Tablet, 1.5 tab(s),  once a day, 90 days, Refills=0    ------      Refill metoprolol tablet, 100 mg, orally, 180 Tablet, 1 tab(s), 2 times a  day, 90 days, Refills=0          * * *                ---          * * *          ProviderSilverio Decamp, [name] 07/01/2020    ---    Note generated by eClinicalWorks EMR/PM Software ([email or web address])

## 2020-07-07 ENCOUNTER — Ambulatory Visit (HOSPITAL_BASED_OUTPATIENT_CLINIC_OR_DEPARTMENT_OTHER): Admitting: Family Medicine

## 2020-07-07 NOTE — Progress Notes (Signed)
* * *      [name], [name] **DOB:** [birth year] (69 yo F) **Acc No.** [id number] **DOS:**  07/07/2020    ---        [name], [name]**    ------    73 Y old Female, DOB: 1951-02-04    [address], [place], [place] [address]    Home: [phone]    Provider: Alfonso Ramus        * * *    Web Encounter    ---    Answered by    Staff, Clinical    Date: 07/07/2020        Time: 04:20 PM    Caller    Katherene Cullom    ------            Reason    New Refill Request            Message                      ***Start of Medication List: ***       glimepiride 4 mg 1 tab(s)  orally once a day 90 days              #90 Tablet with no refill(s)      ***End of Med List ***       Comments:                Action Taken                      Maple Hudson, RN,[name] 07/08/2020 6:44:04 AM > last FU 03/08/2020.      Falcon, CMA,Nailin  07/08/2020 11:16:52 AM > erx sent                 Refills    Refill glimepiride tablet, 4 mg, orally, 90 Tablet, 1 tab(s), once  a day, 90 days, Refills=0    ------          * * *                ---          * * *          Provider: Peggye Form B 07/07/2020    ---    Note generated by eClinicalWorks EMR/PM Software ([email or web address])

## 2020-08-04 ENCOUNTER — Ambulatory Visit (HOSPITAL_BASED_OUTPATIENT_CLINIC_OR_DEPARTMENT_OTHER): Admitting: Family Medicine

## 2020-08-04 NOTE — Progress Notes (Signed)
* * *      [name], [name] **DOB:** 20-Jun-1951 (69 yo F) **Acc No.** [id number] **DOS:**  08/04/2020    ---        [name], [name]**    ------    89 Y old Female, DOB: [birth year]    [address] [place], [place] [address]    Home: [phone]    Provider: Alfonso Ramus        * * *    Web Encounter    ---    Answered by    Staff, Clinical    Date: 08/04/2020        Time: 01:52 PM    Caller    [name]    ------            Reason    New Refill Request            Message                      ***Start of Medication List: ***       gabapentin 300 mg 1 cap(s)  orally 2 times a day 30 days              #60 with no refill(s)      ***End of Med List ***       Comments:                Action Taken                      fernandes,sahrena  08/04/2020 3:17:05 PM > script sent                Refills    Refill gabapentin capsule, 300 mg, orally, 60, 1 cap(s), 2 times a  day, 30 days, Refills=0    ------          * * *        **eMessages**   From:    [name]    ------    Created:    2020-08-04 15:18:24.0    Sent:      Subject:    RE:New Refill Request    Message:                      [name]  08/04/2020 3:17:05 PM > script sent                        ---          * * *          Provider: Peggye Form B 08/04/2020    ---    Note generated by eClinicalWorks EMR/PM Software ([email or web address])

## 2020-08-29 ENCOUNTER — Ambulatory Visit (HOSPITAL_BASED_OUTPATIENT_CLINIC_OR_DEPARTMENT_OTHER)

## 2020-08-29 NOTE — Progress Notes (Signed)
* * *      [name], [name] **DOB:** 17-Jun-1951 (69 yo F) **Acc No.** [id number] **DOS:**  08/29/2020    ---        [name], [name]**    ------    [name], DOB: May 27, 1951    [address] [place], [place] [address]    Home: [phone]    Provider: Darryl Lent        * * *    Web Encounter    ---    Answered by    Staff, Clinical    Date: 08/29/2020        Time: 02:42 PM    Caller    Candice Landress    ------            Reason    New Refill Request            Message                      ***Start of Medication List: ***       metFORMIN 1000MG  1 tab(s)  orally 2 times a day 90 days              #180 Tablet with no refill(s)      ***End of Med List ***       Comments:                Action Taken                      [name]  08/29/2020 2:54:16 PM > last A1C 03/08/20 at last FU appt, last CPE 06/23/2019, no pending appts.  Can I send to you for refill as Dr. Dot Been is out of the office, thanks.      Briona Korpela B 08/29/2020 6:54:46 PM > R sent.      [name]  08/30/2020 12:06:23 PM > pt notified of same via portal message.                Refills    Refill metFORMIN tablet, 1000MG , orally, 180 Tablet, 1 tab(s), 2  times a day, 90 days, Refills=0    ------          * * *        **eMessages**   From:    [name]    ------    Created:    2020-08-30 12:06:22.0    Sent:      Subject:    RE:New Refill Request    Message:    [name], your prescription refill was sent to the pharmacy  yesterday evening, you can check with them to make sure it is ready to be  picked up.        Thanks,    Ivy Lynn, RN-BSN                    ---          * * *          Provider: Denna Haggard B 08/29/2020    ---    Note generated by eClinicalWorks EMR/PM Software ([email or web address])

## 2020-09-13 ENCOUNTER — Ambulatory Visit (HOSPITAL_BASED_OUTPATIENT_CLINIC_OR_DEPARTMENT_OTHER): Admitting: Family

## 2020-09-13 ENCOUNTER — Encounter

## 2020-09-13 NOTE — Progress Notes (Signed)
* * *      Doescher, Davian L **DOB:** August 01, 1951 (69 yo F) **Acc No.** [id number] **DOS:**  09/13/2020    ---        [name], Vitalia L**    ------    64 Y old Female, DOB: [birth year]    [address] [place], [place] [address]    Home: [phone]    Provider: Mirna Mires        * * *    Telephone Encounter    ---    Answered by    Mirna Mires    Date: 09/13/2020        Time: 12:00 PM    Reason    Notes    ------            Action Taken                      [facility]  09/13/2020 12:00:36 PM > Need last eye exam note from Dr. Selena Batten at [facility]  09/13/2020 2:22:22 PM > requested                     * * *                ---          * * *          Provider: Mirna Mires 09/13/2020    ---    Note generated by eClinicalWorks EMR/PM Software ([email or web address])

## 2020-09-13 NOTE — Progress Notes (Signed)
* * *      Rondon, Sakina L **DOB:** [birth year] (69 yo F) **Acc No.** [id number] **DOS:**  09/13/2020    ---        [name], Zakari L**    ------    80 Y old Female, DOB: [birth year]    [address] [place], [place] [address]    Home: [phone]    Provider: Mirna Mires        * * *    Telephone Encounter    ---    Answered by    Allie Dimmer CMA, Ellice    Date: 09/13/2020        Time: 03:36 PM    Caller    daughter    ------            Reason    refill Losartan            Message                      She was seen this morning for her physical and was supposed to get a refill of Losartan sent to [facility]. Any questions, please call [phone]                Action Taken                      Nida Boatman  09/13/2020 3:41:23 PM > looks like it was sent and call came in after if you could follow-up      [name],[name]  09/13/2020 4:20:26 PM > [facility] states losartan is to early to fill can be filled 12/20      [name],[name]  09/13/2020 4:22:01 PM > ldm letting pt know                     * * *                ---          * * *          Provider: Mirna Mires 09/13/2020    ---    Note generated by eClinicalWorks EMR/PM Software ([email or web address])

## 2020-09-13 NOTE — Progress Notes (Signed)
[name], [name] **DOB:** August 07, 1951 (69 yo F) **Acc No.** [id number] **DOS:**  09/13/2020    ---      ** Progress Notes  **    ---    **Patient:** [name], [name]    **Account Number:** [id number]    **Provider:** Mirna Mires, FNP    **DOB:** 08-27-1951  **Age:** 69 Y  **Sex:** Female    **Date:** 09/13/2020    **Phone:** [phone]    **Address:** [address], [place], [phone]    **Pcp:** EMELYN B MOLATO        * * *        **Subjective:**        ---      **Chief Complaints:**    ------      1\. cpe. check A1c. phq9. 2. Here with daughter [name]. 3. Female CPE 84 and  older w/o PAP.    ------      **HPI:**    _PHQ Form_ :    PHQ9 Form Little interest or pleasure in doing things More than half the days,  Feeling down, depressed, hopeless More than half the days, Trouble falling or  staying asleep, or sleeping to much Several days, Feeling tired or little  energy Several days, Poor appetite or overeating Not at all, Feeling bad about  yourself-or that you are a failure or let yourself or your family down Not at  all, Trouble concentrating on things such as reading the newspaper or watching  television Not at all, Moving or speaking so slowly that other people could  have noticed. or the opposite - being so fidgety or restless that you have  been moving around a lot more than usual Not at all, Thoughts that you were  better off dead, or of hurting yourself in someway? Several days(Consider  Suicide Assessment Risk), Total score 7, Interpretation Mild Depression.    _Female CPE/Screening_ :    c/o complete physical exam. . c/o Mammogram:  Date done - 01/27/18  ,  Normal  ,  Due now  . c/o BMD:  Date done - 01/27/18  ,  Normal  . c/o Colonoscopy:  Refuses  ,  Insure Fit -  . c/o Immunizations:  Up to date  ,  Recommend flu  shot now  .    Denies : Recent illness, new health problem, or hospitalization.. Denies :  Recent hospitalization, urgent care or ED visits:. Denies : Recent consults:.  Denies : PAP/HPV:.     Went to [facility] on Sunday, COVID negative, waiting on TC.    _Diabetes_ :    c/o DIABETES F/U. c/o HOME GLUCOSE MONITORING  AM fasting:140-170  . c/o  COMPLIANCE.    Denies : HYPOGLYCEMIC EPISODES. Denies : POLYURIA. Denies : POLYDYPSIA. Denies  : POLYPHAGIA. Denies : WEIGHT GAIN.    HGBA1C  8.2  . LDL  70  . MICROALBUMINURIA  borderline high  . LAST EYE EXAM  Date 12/29/2018 No retinopathy, Copy of Report on file? Yes. EYE DOCTOR  Dr  Su Hilt  . FLU SHOT  received  . PNEUMOVAX  received  .    _Health Screening_ :    c/o Diabetes .    Denies : Fall Risk Screening.    Depression Was the patient screened for depression in the current year? Yes,  PHQ-9 negative for depression. Breast Cancer Screening  ordered  . Colorectal  Cancer Screening Patient had: _ Declines, order for insurefit placed.  Influenza Vaccination (08/01-03/31) Documented for current  year(administered  in office or elsewhere) Yes. Tobacco Use Was the patient screened for tobacco  use (current year): Tobacco Non-user. Statin Therapy Does the patient have any  of the following: Diabetes (ages 68-75), LDL-C result between 70-189 mg/dl in  1610 9604 or 5409 Yes, Is the patient on a statin? Yes. Cost of Medication  Pt  is able to pay  . Pneumonia Vaccination Documented (administered in office or  elsewhere): Yes. BMI Screening (18.5-24.9) Within range: No, Weight Management  Counseling and follow up at next appointment: Discussed diet and exercise plan  to lower BMI. Medication Reconciliation Post-Discharge Has the patient  recently been discharged from an inpatient faclity (hospital, skilled nursing  home, rehab) and seen within 30 days? No.    ------      **ROS:**    _CONSTITUTIONAL_ :    Unexplained weight change  none  .    _OPTHALMOLOGY_ :    Glasses or contacts  none  .    _ENT_ :    Dental care  up-to-date  . Sore Throat  yes  . Hoarseness  none  . Swollen  Lymph Nodes  none  .    _ALLERGY_ :    Environmental Allergies  none  .     _RESPIRATORY_ :    Shortness of Breath  none  . Persistent Cough  none  .    _CARDIOLOGY_ :    Chest Pain  none  . Palpitations  none  . Leg Edema  none  . Exertional  symptoms  none  .    _GASTROENTEROLOGY_ :    Dysphagia  none  . Abdominal Pain  none  . Constipation  none  . Diarrhea  none  . Blood in Stool  none  . Heartburn  none  .    _FEMALE REPRODUCTIVE_ :    Hot Flashes  none  . Abnormal Vaginal Discharge  none  . Dyspareunia  none  .  Breast lump  none  . Practices SBE  regulary  . Vaginal dryness  none  .    _UROLOGY_ :    Blood in Urine  none  . Urinary Incontinence  none  . Nocturia  None  .  Dysuria  none  .    _MUSCULOSKELETAL_ :    Joint Swelling  none  . Joint Pain  none  . Joint Stiffness  none  .    _NEUROLOGY_ :    Tingling/Numbness  none  . Dizziness  none  . Weakness  none  . Visual Changes  none  . Headaches  none  .    _PSYCHOLOGY_ :    Depression  none  . Sleep Disturbances  none  . Anxiety  none  .    _DERMATOLOGY_ :    New or changing skin lesions  none  .    _HEMATOLOGY/LYMPH_ :    Bleeding tendencies  none  .        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        ------      **Gyn History:** Periods : no spotting since menopause. Last pap smear date  2016 no  HPV done. Last mammogram date 2017. Date of Last Period unsure,  somewhere around age 78-55. Menarche 13.    ------      **OB History:** Total pregnancies 7\. Total living children 2\.  Miscarriage(s) 5\.    ------      **Surgical History:** ectopic pregnancies , left ankle screws  2010.    ------      **Hospitalization/Major Diagnostic Procedure:** Denies Past  Hospitalization.    ------      **Family History:** Father: deceased 23 yrs, MI, diagnosed with Heart  Disease, Hypertension. Mother: deceased 71 yrs, DMII, cirrhoisis of the liver,  diagnosed with Diabetes. Sibling 1: sister ovarian/uterus CA, diagnosed with  Hypertension, Diabetes. Sibling 2: brother - DMII, HTN, diagnosed with  Hypertension, Diabetes. 4 brother(s) , 2 sister(s) . 2 daughter(s) - healthy.  .    ------      **Social History:** Sexual History  Had sex in the past 12 months (vaginal,  oral, or anal)? No  , Have you ever had an STD? No. Alcohol Smart Form  Did  you have a drink containing alcohol in the past year? No  , Points 0  ,  Interpretation Negative. Tobacco Use  Status: never smoker  , Patient  counseled on the dangers of tobacco use: 09/13/2020. Seat belt: yes. no  Alcohol. no Drug use. Diet/Nutrition: Three meals a day, Dairy, Fruits &  Vegetables, Protein Foods. no Exercise. Marital Status: married, husband -  Antonio. no Domestic violence. Children: two Daughters. Number of household  members: 6, self, husband, daughter, son in law, grand daughter, grandson.  Occupation: retired. no Travel outside [place]. Caffeine: coffee in the am. Home  smoke detector use: yes. Pets: 2 cat, 3 dogs (chihuahuas). Sexual orientation:  heterosexual. Sexually active: not currently.    ------      **Medications:** Taking multivitamin Multiple Vitamins capsule 1 cap(s)  orally once a day, Taking aspirin 81 mg delayed release tablet 1 tab(s) orally  once a day, Taking Freestyle Freedom Lite Lancets as directed 2x a day, Taking  Freestyle Freedom Lite Test Strips as directed 2x a day, Taking PriLOSEC OTC  20 mg delayed release tablet 1 tab(s) orally once a day, Taking Lotrisone  0.05%-1% cream 1 app applied topically 2 times a day, Notes: prn, Taking  Januvia 25 mg tablet 1 tab(s) orally once a day, Taking lisinopril 10 mg  oral  tablet , Taking losartan 25 mg tablet 1 tab(s) orally once a day, Taking  atorvastatin 40 mg tablet 1 tab(s) orally once a day (at bedtime), Taking  Pioglitazone Hydrochloride 15 mg tablet 1 tab(s) orally once a day, Taking  escitalopram 10 mg tablet 1.5 tab(s) orally once a day, Taking metoprolol 100  mg tablet 1 tab(s) orally 2 times a day, Taking gabapentin 300 mg capsule 1  cap(s) orally 2 times a day, Taking metFORMIN 1000MG  tablet 1 tab(s) orally 2  times a day, Not-Taking glimepiride 4 mg tablet 1 tab(s) orally once a day,  Not-Taking Penlac Nail Lacquer 8% solution 1 app applied topically once a day,  Not-Taking naproxen 500 mg tablet 1 tab(s) orally 2 times a day, Not-Taking  Bactrim DS 800 mg-160 mg tablet 1 tab(s) orally every 12 hours, Not-Taking  Shingrix Vaccine 0.5 ml solution 1 dose IM , Unknown Shingrix Vaccine 0.5 ml  solution 1 dose IM , Unknown Keflex 500 mg capsule 1 cap(s) orally every 8  hours, Medication List reviewed and reconciled with the patient    ------      **  Allergies:** Celexa: hallucinations, lisinopril: cough.    ------        **Objective:**        ---      **Vitals:** HR **53** , BP **132/60** , O2 Sat. **99** , Ht 58, Wt **173**  , Wt Change 2 lb, BMI  **36.15** .    ------      **Examination:    ** _General Examination:_    General  alert and oriented  ,  well nourished and hydrated  ,  appropriate  attire and affect  .    HEENT:  clear conjunctiva  ,  TM's clear and flat bilaterally  ,  nose clear  ,  oropharynx clear with MMM. Tonsillar erythema and erythema of gums at base  of teeth without abscess.  .    Neck, Thyroid :  supple  ,  no thyromegaly  ,  no lymphadenopathy  .    Breasts :  no lumps felt on either side  ,  nipples unremarkable  ,  no  dimpling  ,  no skin changes  ,  no axillary lymphadenopathy  .    Heart:  RRR  ,  no murmurs  .    Lungs:  clear to auscultation bilaterally  ,  no wheezes/rhonchi/rales  .    Abdomen:  soft, NT/ND, BS present  ,  no  masses palpated  ,  no  hepatosplenomegaly  .    Extremities  no clubbing, no edema  .    Peripheral pulses:  normal (2+) bilaterally  .    Skin:  moist, warm  .    Neurologic Exam:  non-focal exam  .        ------            **Assessment:**        ---      **Assessment:**        1\. Screening for malignant neoplasm of colon - Z12.11 (Primary), No known  family hx    2\. Encounter for screening mammogram for malignant neoplasm of breast -  Z12.31, No known family hx    3\. GERD (gastroesophageal reflux disease) - K21.9    4\. Hypertension - I10    5\. Adjustment disorder with depressed mood - F43.21    6\. History of MRSA infection - Z86.14    7\. Thoracic back pain - M54.6    8\. Pharyngitis, unspecified etiology - J02.9    9\. Oral infection - K12.2    10\. 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 Morbid (severe) obesity due to excess calories - E66.01    12\. Body mass index [BMI] 36.0-36.9, adult - Z68.36    ------        **Plan:**        ---        **1\. Screening for malignant neoplasm of colon**    _LAB: FECAL GLOBIN BY IMMUNOCHEMISTRY (11290)_    Notes: Order for mammogram placed.    ---    **2\. Encounter for screening mammogram for malignant neoplasm of breast**    _Imaging: [facility] Breast Screening e-sch*_    Notes: Order for insure fit placed.    **3\. GERD (gastroesophageal reflux disease)**    Notes: Stable at this time on current regimen.    **4\. Hypertension**    Refill losartan tablet, 25 mg, 1 tab(s), orally, once a day, 90 days, 90  Tablet, Refills 1 .  _LAB: Comprehensive Metabolic Panel_ K+ 5.3, gluc 187      Value    Reference Range    ---------    Creatinine    1.130      0.550-1.300 - mg/dL    Sodium Lvl    161      136-146 - mmol/[name]    ------------    Potassium Lvl    5.3    H    3.6-5.2 - mmol/[name]    ------------    Chloride    107      98-110 - mmol/[name]    ------------    CO2    28      21-32 - mmol/[name]     ------------    Total Protein    6.9      6.0-8.4 - Gm/dL    ------------    Albumin Lvl    3.5      3.2-5.0 - Gm/dL    ------------    Calcium Lvl    9.1      8.5-10.5 - mg/dL    ------------    Glucose Lvl    187    H    70-110 - mg/dL    ------------    Bili Total    0.3      0.2-1.2 - mg/dL    ------------    Alk Phos    100      30-117 - Units/[name]    ------------    AST    24      6-40 - Units/[name]    ------------    ALT    33      6-55 - Units/[name]    ------------    Anion Gap    4      3-11 -    ------------    BUN    17      8-23 - mg/dL    ------------      [name] 10/05/2020 7:32:05 AM > Annalysse Shoemaker 10/06/2020 8:24:21 AM  >    ------    _LAB: Urinalysis (Complete)_ 25 leuks 16 wbc    Value    Reference Range    ---------    UA Clarity    Clear      Clear -    UA Spec Grav    1.017      1.003-1.030 -    ------------    UA pH    7.0      5.0-8.0 -    ------------    UA Protein    Negative      Negative - mg/dL    ------------    UA Glucose    Negative      Negative - mg/dL    ------------    UA Ketones    Negative      Negative - mg/dL    ------------    UA Blood    Negative      Negative -    ------------    UA Bili    Negative      Negative -    ------------    UA Urobilinogen    Negative      Negative -    ------------    UA Nitrite    Negative      Negative -    ------------    UA Leuk Est    25    A    Negative - WBC/uL    ------------  UA RBC    2      0-2 - /HPF    ------------    UA WBC    16    H    0-5 - /HPF    ------------    UA Bacteria    None      None -    ------------    UA Squamous Epi    <1      0-5 - /HPF    ------------    UA Color    Yellow      Yellow -    ------------      [facility] 10/03/2020 9:23:40 PM >    ------        Notes: Refill placed for losartan. Readings stable. Order for CMP placed.    **5\. Adjustment  disorder with depressed mood**    _LAB: TSH Reflex Panel (Recommended)_ 4.200      Value    Reference Range    ---------    3rd Gen TSH    4.200    H    0.358-3.740 - mclU/ml      Prugh,RN ,Melanie 10/05/2020 7:32:21 AM >    ------        Notes: Stable at this time.    **6\. History of MRSA infection**    Notes: Noted on file.    **7\. Thoracic back pain**    Notes: Unchanged.    **8\. Pharyngitis, unspecified etiology**    Start amoxicillin tablet, 875 mg, 1 tab(s), orally, every 12 hours, 10 day(s),  20 tablets, Refills 0 .    _LAB: CBC w/ Indices_    Notes: Pt with acute pharyngitis and inflammation of gums. Will opt to  empiracally tx with amox, pt advised to follow-up with her dentist.    **9\. Oral infection**    _LAB: CBC w/ Indices_    Notes: Plan as noted above.    **10\. Type 2 diabetes mellitus with diabetic neuropathy, unspecified**    Refill gabapentin capsule, 300 mg, 1 cap(s), orally, 2 times a day, 30 days,  60, Refills 0 .    _LAB: Hemoglobin A1C_ 7.7      Value    Reference Range    ---------    A1C    7.7          Yamira Papa 09/13/2020 12:30:02 PM >    ------    _LAB: Comprehensive Metabolic Panel_ K+ 5.3, gluc 187    Value    Reference  Range    ---------    Creatinine    1.130      0.550-1.300 - mg/dL    Sodium Lvl    161      136-146 - mmol/[name]    ------------    Potassium Lvl    5.3    H    3.6-5.2 - mmol/[name]    ------------    Chloride    107      98-110 - mmol/[name]    ------------    CO2    28      21-32 - mmol/[name]    ------------    Total Protein    6.9      6.0-8.4 - Gm/dL    ------------    Albumin Lvl    3.5      3.2-5.0 - Gm/dL    ------------    Calcium Lvl    9.1      8.5-10.5 - mg/dL    ------------  Glucose Lvl    187    H    70-110 - mg/dL    ------------    Bili Total    0.3      0.2-1.2 - mg/dL    ------------    Alk Phos    100      30-117 - Units/[name]    ------------    AST    24      6-40 -  Units/[name]    ------------    ALT    33      6-55 - Units/[name]    ------------    Anion Gap    4      3-11 -    ------------    BUN    17      8-23 - mg/dL    ------------      [name] 10/05/2020 7:32:05 AM > Niguel Moure 10/06/2020 8:24:21 AM  >    ------    _LAB: Lipid Panel_ trig 181    Value    Reference Range    ---------    Chol    154      <=200 - mg/dL    HDL    55      >=16 - mg/dL    ------------    Trig    181    H    <=150 - mg/dL    ------------    LDL    63      <=130 - mg/dL    ------------    Status    Fasting      \-    ------------      Collier Bullock ,Melanie 10/05/2020 7:32:14 AM >    ------    _LAB: Urinalysis (Complete)_ 25 leuks 16 wbc    Value    Reference Range    ---------    UA Clarity    Clear      Clear -    UA Spec Grav    1.017      1.003-1.030 -    ------------    UA pH    7.0      5.0-8.0 -    ------------    UA Protein    Negative      Negative - mg/dL    ------------    UA Glucose    Negative      Negative - mg/dL    ------------    UA Ketones    Negative      Negative - mg/dL    ------------    UA Blood    Negative      Negative -    ------------    UA Bili    Negative      Negative -    ------------    UA Urobilinogen    Negative      Negative -    ------------    UA Nitrite    Negative      Negative -    ------------    UA Leuk Est    25    A    Negative - WBC/uL    ------------    UA RBC    2      0-2 - /HPF    ------------    UA WBC    16    H    0-5 - /HPF    ------------    UA Bacteria    None  None -    ------------    UA Squamous Epi    <1      0-5 - /HPF    ------------    UA Color    Yellow      Yellow -    ------------      [facility] 10/03/2020 9:23:40 PM >    ------        Notes: Refill of gabapentin sent, will check A1C. If no eye exam by follow-up  will place order for retinavue.    **11\. Morbid (severe) obesity  due to excess calories**    _LAB: TSH Reflex Panel (Recommended)_ 4.200      Value    Reference Range    ---------    3rd Gen TSH    4.200    H    0.358-3.740 - mclU/ml      Prugh,RN ,Melanie 10/05/2020 7:32:21 AM >    ------    **12\. Body mass index [BMI] 36.0-36.9, adult**    _LAB: TSH Reflex Panel (Recommended)_ 4.200      Value    Reference Range    ---------    3rd Gen TSH    4.200    H    0.358-3.740 - mclU/ml      Prugh,RN ,Melanie 10/05/2020 7:32:21 AM >    ------    **13\. Others**    Refill Pioglitazone Hydrochloride tablet, 15 mg, 1 tab(s), orally, once a day,  30 day(s), 30, Refills 3 .      **Procedure Codes:** [id number] BLOOD DRAWING, 54098 GLYCATED HEMOGLOBIN TEST    ------      **Preventive Medicine:**    Counseling: Vision Care . Dental Care . Diet . Exercise . Calcium and vitamin  D . Sunscreen . Breast self exam .    ------      **Follow Up:** 53M (Reason: diabetes follow-up)    ------    ---    ---                ---    Electronically signed by Mirna Mires , FNP-C on 11/14/2020 at 08:49 AM EST    Sign off status: Completed          * * *      **Provider:** Mirna Mires, FNP    **Date:** 09/13/2020    ------

## 2020-09-29 ENCOUNTER — Ambulatory Visit (HOSPITAL_BASED_OUTPATIENT_CLINIC_OR_DEPARTMENT_OTHER): Admitting: Family

## 2020-09-29 ENCOUNTER — Ambulatory Visit (HOSPITAL_BASED_OUTPATIENT_CLINIC_OR_DEPARTMENT_OTHER): Admitting: Family Medicine

## 2020-09-29 ENCOUNTER — Ambulatory Visit: Admitting: Family

## 2020-09-29 LAB — HX .AUTOMATED DIFF
CASE NUMBER: 2021364001103
HX ABSOLUTE BASO COUNT: 0.06 10*3/uL — NL (ref 0.0–0.22)
HX ABSOLUTE EOS COUNT: 0.5 10*3/uL — ABNORMAL HIGH (ref 0.0–0.45)
HX ABSOLUTE LYMPHS COUNT: 2.99 10*3/uL — NL (ref 0.74–5.04)
HX ABSOLUTE MONO COUNT: 0.87 10*3/uL — NL (ref 0.0–1.34)
HX ABSOLUTE NEUTRO COUNT: 7.2 10*3/uL — NL (ref 1.48–7.95)
HX BASOPHILS: 0.5 %
HX EOSINOPHILS: 4.3 %
HX IMMATURE GRANULOCYTES: 0.5 % — NL (ref 0.0–2.0)
HX LYMPHOCYTES: 25.6 %
HX MONOCYTES: 7.4 %
HX NEUTROPHILS: 61.7 %

## 2020-09-29 LAB — HX URINALYSIS (COMPLETE)
CASE NUMBER: 2021364001104
HX UA BILIRUBIN: NEGATIVE — NL
HX UA BLOOD: NEGATIVE — NL
HX UA GLUCOSE: NEGATIVE — NL
HX UA KETONES: NEGATIVE — NL
HX UA LEUKOCYTE ESTERASE: 25 WBC/uL — AB
HX UA NITRITE: NEGATIVE — NL
HX UA PH: 7 — NL (ref 5.0–8.0)
HX UA PROTEIN: NEGATIVE — NL
HX UA RBC: 2 /HPF — NL (ref 0.0–2.0)
HX UA SPECIFIC GRAVITY: 1.017 — NL (ref 1.003–1.03)
HX UA SQUAMOUS EPITHELIAL: <1 — NL (ref 0.0–5.0)
HX UA UROBILINOGEN: NEGATIVE — NL
HX UA WBC: 16 /HPF — ABNORMAL HIGH (ref 0.0–5.0)

## 2020-09-29 LAB — HX GLOMERULAR FILTRATION RATE (ESTIMATED)
CASE NUMBER: 2021364001103
HX AFN AMER GLOMERULAR FILTRATION RATE: 58 mL/min/{1.73_m2}
HX NON-AFN AMER GLOMERULAR FILTRATION RATE: 50 mL/min/{1.73_m2}

## 2020-09-29 LAB — HX HEMOGLOBIN A1C
CASE NUMBER: 2021364001103
HX EST AVERAGE GLUCOSE (EAG): 197 mg/dL
HX HBF (INTERNAL): 0.7 % — NL
HX HEMOGLOBIN A1C: 8.5 % — ABNORMAL HIGH
HX LA1C (INTERNAL): 2.7 % — NL
HX P3 PEAK (INTERNAL): 6 % — NL
HX TOTAL AREA RANGE (INTERNAL): 123817 microvolt/sec — NL (ref 50000.0–350000.0)

## 2020-09-29 LAB — HX LIPID PANEL
CASE NUMBER: 2021364001103
HX CHOL: 154 mg/dL — NL
HX HDL: 55 mg/dL — NL
HX LDL: 63 mg/dL — NL
HX TRIG: 181 mg/dL — ABNORMAL HIGH

## 2020-09-29 LAB — HX CBC W/ DIFF
CASE NUMBER: 2021364001103
HX ABSOLUTE NRBC COUNT: 0 10*3/uL
HX HCT: 33.4 % — ABNORMAL LOW (ref 36.0–47.0)
HX HGB: 10 g/dL — ABNORMAL LOW (ref 11.8–16.0)
HX MCH: 24.6 pg — ABNORMAL LOW (ref 26.0–34.0)
HX MCHC: 29.9 g/dL — ABNORMAL LOW (ref 31.0–37.0)
HX MCV: 82.1 fL — NL (ref 80.0–100.0)
HX MPV: 11.7 fL — NL (ref 9.4–12.4)
HX NRBC PERCENT: 0 % — NL
HX PLATELET: 238 10*3/uL — NL (ref 150.0–400.0)
HX RBC: 4.07 10*6/uL — NL (ref 3.9–5.2)
HX RDW-CV: 14.7 % — ABNORMAL HIGH (ref 11.5–14.5)
HX RDW-SD: 43.2 fL — NL (ref 35.0–51.0)
HX WBC: 11.7 10*3/uL — ABNORMAL HIGH (ref 3.7–11.2)

## 2020-09-29 LAB — HX COMPREHENSIVE METABOLIC PANEL
CASE NUMBER: 2021364001103
HX ALBUMIN LVL: 3.5 g/dL — NL (ref 3.2–5.0)
HX ALKALINE PHOSPHATASE: 100 U/L — NL (ref 30.0–117.0)
HX ALT: 33 U/L — NL (ref 6.0–55.0)
HX ANION GAP: 4 — NL (ref 3.0–11.0)
HX AST: 24 U/L — NL (ref 6.0–40.0)
HX BILIRUBIN TOTAL: 0.3 mg/dL — NL (ref 0.2–1.2)
HX BUN: 17 mg/dL — NL (ref 8.0–23.0)
HX CALCIUM LVL: 9.1 mg/dL — NL (ref 8.5–10.5)
HX CHLORIDE: 107 mmol/L — NL (ref 98.0–110.0)
HX CO2: 28 mmol/L — NL (ref 21.0–32.0)
HX CREATININE: 1.13 mg/dL — NL (ref 0.55–1.3)
HX GLUCOSE LVL: 187 mg/dL — ABNORMAL HIGH (ref 70.0–110.0)
HX POTASSIUM LVL: 5.3 mmol/L — ABNORMAL HIGH (ref 3.6–5.2)
HX SODIUM LVL: 139 mmol/L — NL (ref 136.0–146.0)
HX TOTAL PROTEIN: 6.9 g/dL — NL (ref 6.0–8.4)

## 2020-09-29 LAB — HX TSH REFLEX PANEL (RECOMMENDED)
CASE NUMBER: 2021364001103
HX 3RD GEN TSH: 4.2 u[IU]/mL — ABNORMAL HIGH (ref 0.358–3.74)

## 2020-09-29 LAB — HX FREE T4
CASE NUMBER: 2021364001103
HX T4 FREE: 0.8 ng/dL — NL (ref 0.7–1.7)

## 2020-09-29 NOTE — Progress Notes (Signed)
* * *      [name], [name] **DOB:** [birth year] (69 yo F) **Acc No.** [id number] **DOS:**  09/29/2020    ---        [name], [name]**    ------    56 Y old Female, DOB: 01-23-1951    [address], [place], [place] [address]    Home: [phone]    Provider: Alfonso Ramus        * * *    Web Encounter    ---    Answered by    Staff, Clinical    Date: 09/29/2020        Time: 12:12 PM    Caller    Tanaysia Steenson    ------            Reason    New Refill Request            Message                      ***Start of Medication List: ***       metoprolol 100 mg 1 tab(s)  orally 2 times a day 90 days              #180 Tablet with no refill(s)            escitalopram 10 mg 1.5 tab(s)  orally once a day 90 days              #135 Tablet with no refill(s)            atorvastatin 40 mg 1 tab(s)  orally once a day (at bedtime) 90 days              #90  with 1 refill(s)      ***End of Med List ***       Comments:                Action Taken                      [name]  10/04/2020 4:39:28 PM > see TE 1/3 re. same, closing this TE                    * * *                ---          * * *          Provider: Peggye Form B 09/29/2020    ---    Note generated by eClinicalWorks EMR/PM Software ([email or web address])

## 2020-09-30 LAB — HX URINE CULTURE
CASE NUMBER: 2021364002198
HX F: >100000
HX P: 70000

## 2020-10-03 ENCOUNTER — Ambulatory Visit (HOSPITAL_BASED_OUTPATIENT_CLINIC_OR_DEPARTMENT_OTHER): Admitting: Family

## 2020-10-03 NOTE — Progress Notes (Signed)
* * *      [name], [name] **DOB:** 09-30-1951 (69 yo F) **Acc No.** [id number] **DOS:**  10/03/2020    ---        [name], [name]**    ------    63 Y old Female, DOB: 08/09/1951    [address] [place], [place] [address]    Home: [phone]    Provider: Mirna Mires        * * *    Web Encounter    ---    Answered by    Staff, Clinical    Date: 10/03/2020        Time: 09:42 PM    Caller    [name]    ------            Reason    New Refill Request            Message                      ***Start of Medication List: ***       metoprolol 100 mg 1 tab(s)  orally 2 times a day 90 days              #180 Tablet with no refill(s)            escitalopram 10 mg 1.5 tab(s)  orally once a day 90 days              #135 Tablet with no refill(s)            atorvastatin 40 mg 1 tab(s)  orally once a day (at bedtime) 90 days              #90  with 1 refill(s)      ***End of Med List ***       Comments:                Action Taken                      Maple Hudson, RN,[name] 10/04/2020 7:02:34 AM > cpe 09/13/2020.      [name]  10/04/2020 4:40:00 PM > can I send to you for refills, as RA is out and daughter left message that they have been waiting since last week for these refills, thanks.      HARCOURT,PAUL G 10/04/2020 6:59:05 PM >  erx done                Refills    Refill metoprolol tablet, 100 mg, orally, 180 Tablet, 1 tab(s), 2  times a day, 90 days, Refills=1    ------      Continue escitalopram tablet, 10 mg, orally, 135 Tablet, 1.5 tab(s), once a  day, 90 days, Refills=1      Refill atorvastatin tablet, 40 mg, orally, 90, 1 tab(s), once a day (at  bedtime), 90 days, Refills=1          * * *                ---          * * *          Provider: Mirna Mires 10/03/2020    ---    Note generated by eClinicalWorks EMR/PM Software ([email or web address])

## 2020-10-05 ENCOUNTER — Ambulatory Visit (HOSPITAL_BASED_OUTPATIENT_CLINIC_OR_DEPARTMENT_OTHER): Admitting: Family

## 2020-10-05 NOTE — Progress Notes (Signed)
* * *    Mis, Vinaya L **DOB:** 1951-06-23 (70 yo F) **Acc No.** [id number] **DOS:**  10/05/2020    ---        [name], Kijana L**    ------    70 Y old Female, DOB: September 22, 1951    [address], [place], [place] [address]    Home: [phone]    Provider: Mirna Mires        * * *    Web Encounter    ---    Answered by    Staff, Clinical    Date: 10/05/2020        Time: 09:26 AM    Caller    Taneil Coto    ------            Reason    lab results            Message                      Addressed To: [place] Lodge, This is [place] on behalf of my mother Earsie.  She would like to know her lab results. I do not see it on the portal yet.            Thank you.                Action Taken                      [employer], I received your portal message this morning; Lupe [place] is out of the office on Wednesdays, I will forward your message to her for tomorrow.            Thanks,            Ivy Lynn, RN-BSN      [facility]  10/06/2020 8:25:14 AM > Hi [name], Kelis's sugars are not in good control at this time. Her A1C was 8.5% and her triglycerides were elevated which also indicate not having great glycemic control and we will need to adjust her medications. I know mom did not feel well on the glimepiride. Would she be willing to do a once weekly injectable medication to help manage her glucose better? And is she taking the Januvia every day? Her thyroid hormones were slightly off and I would like to recheck those in 3 months. Her labs also show that she is mildly anemic - is she taking her multivitamin every day? I would recommend a 3 month office visit so we can recheck sugars and labs then as well. Best regards, [name], NP-C                    * * *        **eMessages**   From:    [name]    ------    Created:    2020-10-05 10:17:48.0    Sent:    2020-10-05 10:18:52.0    Subject:    RE:lab results    Message:      Crawford Givens, I received your portal message this  morning; Lupe [place] is out of the  office on Wednesdays, I will forward your message to her for tomorrow.    Thanks,    Ivy Lynn, RN-BSN                    * * *  From:    [facility]    Created:    2020-10-06 08:32:31.0    Sent:      Subject:    RE:lab results    Message:                      Mirna Mires  10/06/2020 8:25:14 AM > Hi [name], Raena's sugars are not in good control at this time. Her A1C was 8.5% and her triglycerides were elevated which also indicate not having great glycemic control and we will need to adjust her medications. I know mom did not feel well on the glimepiride. Would she be willing to do a once weekly injectable medication to help manage her glucose better? And is she taking the Januvia every day? Her thyroid hormones were slightly off and I would like to recheck those in 3 months. Her labs also show that she is mildly anemic - is she taking her multivitamin every day? I would recommend a 3 month office visit so we can recheck sugars and labs then as well. Best regards, [name], NP-C                      * * *        ---        Reason for Appointment    ---      1\. Lab results    ---          * * *          Provider: Mirna Mires 10/05/2020    ---    Note generated by eClinicalWorks EMR/PM Software ([email or web address])

## 2020-10-06 ENCOUNTER — Ambulatory Visit (HOSPITAL_BASED_OUTPATIENT_CLINIC_OR_DEPARTMENT_OTHER): Admitting: Family

## 2020-10-06 NOTE — Progress Notes (Signed)
* * *    [name], [name] **DOB:** 01-Jan-1951 (70 yo F) **Acc No.** [id number] **DOS:**  10/06/2020    ---        [name], [name]**    ------    53 Y old Female, DOB: 06-09-1951    [address] [place], [place] [address]    Home: [phone]    Provider: Mirna Mires        * * *    Web Encounter    ---    Answered by    Staff, Clinical    Date: 10/06/2020        Time: 03:46 PM    Caller    [name]    ------            Reason    Re:RE:lab results            Message                      Addressed To: Mirna Mires                                She does take [name] everyday. Her a1c may have increased due to having glimperide discontinued. She does not want to take injectable medicine. Can januvia be increased? She was out of her multivitamin for awhile but is now taking it again daily.                                     _____________________________________________________________________            To :[name]            From :Clinical Staff            Sent :10/06/2020 08:32 AM            Subject :RE:lab results                        [name]  10/06/2020 8:25:14 AM > Hi [name], [name]'s sugars are not in good control at this time. Her A1C was 8.5% and her triglycerides were elevated which also indicate not having great glycemic control and we will need to adjust her medications. I know mom did not feel well on the glimepiride. Would she be willing to do a once weekly injectable medication to help manage her glucose better? And is she taking the Januvia every day? Her thyroid hormones were slightly off and I would like to recheck those in 3 months. Her labs also show that she is mildly anemic - is she taking her multivitamin every day? I would recommend a 3 month office visit so we can recheck sugars and labs then as well. Best regards, [name] Car, NP-C                Action Taken                      [facility]  10/13/2020 6:41:33 PM > Hi [name], I tried calling tonight  and am not sure which number is yours - what is the best number to reach you on? Best regards, [name] Car, NP-C                    * * *        **  eMessages**   From:    Mirna Mires    ------    Created:    2020-10-13 18:45:50.0    Sent:      Subject:    RE:Re:RE:lab results    Message:                      Mirna Mires  10/13/2020 6:41:33 PM > Hi [name], I tried calling tonight and am not sure which number is yours - what is the best number to reach you on? Best regards, [name] Car, NP-C                        ---          * * *          Provider: Mirna Mires 10/06/2020    ---    Note generated by eClinicalWorks EMR/PM Software ([email or web address])

## 2020-10-11 ENCOUNTER — Ambulatory Visit (HOSPITAL_BASED_OUTPATIENT_CLINIC_OR_DEPARTMENT_OTHER): Admitting: Family

## 2020-10-11 NOTE — Progress Notes (Signed)
* * *      [name], [name] **DOB:** January 27, 1951 (70 yo F) **Acc No.** [id number] **DOS:**  10/11/2020    ---        [name], [name]**    ------    64 Y old Female, DOB: October 13, 1950    [address] [place], [place] [address]    Home: [phone]    Provider: Mirna Mires        * * *    Web Encounter    ---    Answered by    Staff, Clinical    Date: 10/11/2020        Time: 03:54 PM    Caller    [name]    ------            Reason    lab results/meds            Message                      Addressed To: Mirna Mires        HI            I replied back to a msg on 1/6 to [place].  I have not heard back. Please get back to me.            thank you.                Action Taken                      [facility]  10/11/2020 6:06:01 PM > see other TE                    * * *                ---          * * *          Provider: Mirna Mires 10/11/2020    ---    Note generated by eClinicalWorks EMR/PM Software ([email or web address])

## 2020-10-13 ENCOUNTER — Ambulatory Visit (HOSPITAL_BASED_OUTPATIENT_CLINIC_OR_DEPARTMENT_OTHER): Admitting: Family

## 2020-10-13 NOTE — Progress Notes (Signed)
* * *      [name], [name] **DOB:** 08-24-1951 (70 yo F) **Acc No.** [id number] **DOS:**  10/13/2020    ---        [name], [name]**    ------    54 Y old Female, DOB: Dec 23, 1950    [address] [place], [place] [address]    Home: [phone]    Provider: Mirna Mires        * * *    Web Encounter    ---    Answered by    Staff, Clinical    Date: 10/13/2020        Time: 08:18 PM    Caller    [name]    ------            Reason    RE:Re:RE:lab results            Message                      Addressed To: [name].                        My # is [phone] & my sister [name] who also helps with her meds is [phone].                        Thank you.                                                                 _____________________________________________________________________            To :[name]            From :Clinical Staff            Sent :10/13/2020 06:46 PM            Subject :RE:Re:RE:lab results                        Sal Spratley  10/13/2020 6:41:33 PM > Hi [name], I tried calling tonight and am not sure which number is yours - what is the best number to reach you on? Best regards, [name], NP-C                Action Taken                      [facility]  10/20/2020 12:15:19 PM > see other TE                    * * *                ---          * * *          Provider: Mirna Mires 10/13/2020    ---    Note generated by eClinicalWorks EMR/PM Software ([email or web address])

## 2020-10-13 NOTE — Progress Notes (Signed)
* * *      [name], [name] **DOB:** [birth year] (70 yo F) **Acc No.** [id number] **DOS:**  10/13/2020    ---        [name], [name]**    ------    35 Y old Female, DOB: Feb 25, 1951    [address], [place], [place] [address]    Home: [phone]    Provider: Mirna Mires        * * *    Web Encounter    ---    Answered by    Staff, Clinical    Date: 10/13/2020        Time: 02:42 PM    Caller    [name]    ------            Reason    New Refill Request            Message                      ***Start of Medication List: ***       gabapentin 300 mg 1 cap(s)  orally 2 times a day 30 days              #60 with no refill(s)      ***End of Med List ***       Comments:                Action Taken                      fernandes,sahrena  10/13/2020 3:00:59 PM > script sent                Refills    Refill gabapentin capsule, 300 mg, orally, 60, 1 cap(s), 2 times a  day, 30 days, Refills=0    ------          * * *        **eMessages**   [name]    ------    Created:    2020-10-13 15:01:42.0    Sent:      Subject:    RE:New Refill Request    Message:                      [name]  10/13/2020 3:00:59 PM > script sent                        ---          * * *          Provider: Mirna Mires 10/13/2020    ---    Note generated by eClinicalWorks EMR/PM Software ([email or web address])

## 2020-10-20 ENCOUNTER — Ambulatory Visit (HOSPITAL_BASED_OUTPATIENT_CLINIC_OR_DEPARTMENT_OTHER): Admitting: Family

## 2020-10-20 NOTE — Progress Notes (Signed)
* * *    [name], [name] **DOB:** [birth year] (70 yo F) **Acc No.** [id number] **DOS:**  10/20/2020    ---        [name], [name]**    ------    70 Y old Female, DOB: 12-Oct-1950    [address], [place], [place] [address]    Home: [phone]    Provider: Mirna Mires        * * *    Telephone Encounter    ---    Answered by    Tona Sensing CMA, Delice Bison    Date: 10/20/2020        Time: 10:27 AM    Caller    carla- daughter    ------            Reason    Medications            Message                      pt daughter calling about [place] and increasing, does not want to do injectable. Also asking for labs for TSH, CBC to be put in now before her MArch appt      [phone]                Action Taken                      [facility]  10/20/2020 12:44:34 PM > Order for labs will be mailed.      Delenn Ahn  10/21/2020 6:32:36 PM > Spoke with [name], Januvia stopped and changed to [place]. Daughter aware lab orders electronicall transmitted. Call with any new/worsening symptoms in the interim.                Refills    Stop glimepiride tablet, 4 mg, orally, 1 tab(s), once a day    ------      Stop Januvia tablet, 25 mg, orally, 1 tab(s), once a day      Start Tradjenta tablet, 5 mg, orally, 30 tablets, 1 tab(s), once a day, 30  day(s), Refills=3          * * *              * * *        ---        Reason for Appointment    ---      1\. Medications    ---      Assessments    ---    1\. 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 Abnormal CBC - R79.89    ---    3\. Abnormal TSH - R79.89    ---      Treatment    ---      **1\. Type 2 diabetes mellitus with diabetic neuropathy, unspecified**    Stop glimepiride tablet, 4 mg, 1 tab(s), orally, once a day    Stop Januvia tablet, 25 mg, 1 tab(s), orally, once a day    _LAB: Hemoglobin A1c_    ---        **2\. Abnormal CBC**    _LAB: CBC w/ Diff_        **3\. Abnormal TSH**    _LAB: TSH Reflex Panel (Recommended)_         **4\. Others**    Start Tradjenta tablet, 5 mg, 1 tab(s), orally, once a day, 30  day(s), 30  tablets, Refills 3          * * *          Provider: Mirna Mires 10/20/2020    ---    Note generated by eClinicalWorks EMR/PM Software ([email or web address])

## 2020-10-21 ENCOUNTER — Ambulatory Visit (HOSPITAL_BASED_OUTPATIENT_CLINIC_OR_DEPARTMENT_OTHER): Admitting: Family

## 2020-10-21 NOTE — Progress Notes (Signed)
* * *      [name], [name] **DOB:** Feb 27, 1951 (70 yo F) **Acc No.** [id number] **DOS:**  10/21/2020    ---        [name], [name]**    ------    45 Y old Female, DOB: [birth year]    [address] [place], [place] [address]    Home: [phone]    Provider: Mirna Mires        * * *    Web Encounter    ---    Answered by    Staff, Clinical    Date: 10/21/2020        Time: 09:50 AM    Caller    Whitnie Badgett    ------            Reason    appt.            Message                      Addressed To: Mirna Mires        Hi.            I called to book an appt in March for my mother. I do not see it on the portal. I just want to confirm that it is scheduled for 12/15/2020.            Thank you,            [name]                Action Taken                      fernandes,sahrena  10/21/2020 9:54:49 AM > its in [facility] not [facility], added 12/15/20 at 10am with RA, msg sent                    * * *        **eMessages**   From:    fernandes,sahrena    ------    Created:    2020-10-21 09:56:50.0    Sent:    2020-10-21 09:56:54.0    Subject:    UZ:HQUI.    Message:                      fernandes,sahrena  10/21/2020 9:54:49 AM > its in [place] on our other system, she is scheduled for 12/15/20 at 10am with RA, for A diabetes follow up. Thanks                        ---          * * *          Provider: Mirna Mires 10/21/2020    ---    Note generated by eClinicalWorks EMR/PM Software ([email or web address])

## 2020-10-24 ENCOUNTER — Ambulatory Visit (HOSPITAL_BASED_OUTPATIENT_CLINIC_OR_DEPARTMENT_OTHER): Admitting: Family

## 2020-10-24 NOTE — Progress Notes (Signed)
* * *      Smigel, Ogechi L **DOB:** Nov 25, 1950 (70 yo F) **Acc No.** [id number] **DOS:**  10/24/2020    ---        [name], Jazline L**    ------    24 Y old Female, DOB: 14-Jun-1951    [address] [place], [place] [address]    Home: [phone]    Provider: Mirna Mires        * * *    Web Encounter    ---    Answered by    Staff, Clinical    Date: 10/24/2020        Time: 03:08 PM    Caller    Sybrina Bohlman    ------            Reason    Tradjenta            Message                      Addressed To: Mirna Mires        Hello.            [name] is not covered by her insurance. Is it possible to send the generic or something that will be covered?            thank you,            [name].                Action Taken                      [facility]  10/24/2020 5:39:36 PM > Can we discuss tomorrow?            Hudd,Timothy  10/25/2020 9:54:31 AM > We spoke with [facility] pharmacist who stated the medication is covered and went through insurance. This issue in this case is the patient has a deductible. The cost for this fill is $237. After this fill the copays will then be $37. Tim Hudd, Pharm.D.      Mirna Mires  10/25/2020 6:07:17 PM > Please review above with [name] tomorrow - thank you      Octavio Graves  10/26/2020 11:25:49 AM > Call to [facility], [phone] and explained above from Dr [name].                     * * *                ---          * * *          Provider: Mirna Mires 10/24/2020    ---    Note generated by eClinicalWorks EMR/PM Software ([email or web address])

## 2020-10-25 ENCOUNTER — Ambulatory Visit (HOSPITAL_BASED_OUTPATIENT_CLINIC_OR_DEPARTMENT_OTHER): Admitting: Family

## 2020-10-25 NOTE — Progress Notes (Signed)
* * *      Lanphear, [name] **DOB:** 1951/07/28 (70 yo F) **Acc No.** [id number] **DOS:**  10/25/2020    ---        [name], [name]**    ------    42 Y old Female, DOB: May 24, 1951    [address], [place], [place] [address]    Home: [phone]    Provider: Mirna Mires        * * *    Web Encounter    ---    Answered by    Staff, Clinical    Date: 10/25/2020        Time: 10:50 AM    Caller    [name]    ------            Reason    appt            Message                      Addressed To: Mirna Mires        Hi.            I want to confirm my moms appt 3/24 at 10AM. is that correct? I do not see it on the portal.            Thank you,            [name]                Action Taken                      fernandes,sahrena  10/25/2020 10:53:04 AM > appt is 12/15/20 at 10am, msg sent                    * * *        **eMessages**   From:    [name]    ------    Created:    2020-10-25 10:53:23.0    Sent:      Subject:    VF:IEPP    Message:                      [name]  10/25/2020 10:53:04 AM > appt is 12/15/20 at 10am                        ---          * * *          Provider: Mirna Mires 10/25/2020    ---    Note generated by eClinicalWorks EMR/PM Software ([email or web address])

## 2020-10-28 ENCOUNTER — Ambulatory Visit (HOSPITAL_BASED_OUTPATIENT_CLINIC_OR_DEPARTMENT_OTHER): Admitting: Family

## 2020-10-28 NOTE — Progress Notes (Signed)
* * *    [name], [name] **DOB:** [birth year] (70 yo F) **Acc No.** [id number] **DOS:**  10/28/2020    ---        [name], [name]**    ------    18 Y old Female, DOB: 04/07/1951    [address], [place], [place] [address]    Home: [phone]    Provider: Mirna Mires        * * *    Telephone Encounter    ---    Answered by    Tona Sensing CMA, Delice Bison    Date: 10/28/2020        Time: 03:18 PM    Caller    dtr [name] [phone]    ------            Reason    re dm meds            Message                      pt daughter [name] calling again stating she does understand about the deductile and really can't afford it. Pt is not taking [place] and not sure why it's in her chart. Pt is taking pioglipazone and asking if they can just increase that.                 Action Taken                      Tona Sensing, [place]  10/28/2020 3:20:32 PM > Januvia not on med list, Dow Adolph, CMA,Ellice  11/01/2020 1:45:09 PM > daughter [name] calling again regarding same. Trujenta not afforadable. She can be reached on cell or portal if that is easier.      [name]  11/01/2020 6:06:04 PM > Spoke with [name], pt reports deductible is only on this medication and not an annual deductible for other rx's. Would like to discuss more affordable alternatives. Will review with Dr. [name] on [name] aware.      Aleathia Purdy  11/03/2020 12:31:52 PM > Can I review this with you later?      Hudd,Timothy  11/03/2020 6:02:18 PM > Hello [name], it appears the insurer is attaching the deductible to brand name products. [name] is only available as brand name. She will run into this issue with other brand name options for DM. [name]  11/09/2020 10:17:59 AM > noted thank you                    * * *              * * *        ---        Reason for Appointment    ---      1\. Re dm meds    ---      Current Medications    ---    Taking      * multivitamin Multiple Vitamins capsule 1 cap(s) orally once a day     ---    * aspirin  81 mg delayed release tablet 1 tab(s) orally once a day     ---    * Freestyle Freedom Lite Lancets as directed 2x a day     ---    * Freestyle Freedom Lite Test Strips as directed 2x a day     ---    *  PriLOSEC OTC 20 mg delayed release tablet 1 tab(s) orally once a day     ---    * Lotrisone 0.05%-1% cream 1 app applied topically 2 times a day, Notes: prn     ---    * lisinopril 10 mg oral tablet     ---    * metFORMIN 1000MG  tablet 1 tab(s) orally 2 times a day     ---    * amoxicillin 875 mg tablet 1 tab(s) orally every 12 hours     ---    * Pioglitazone Hydrochloride 15 mg tablet 1 tab(s) orally once a day     ---    * losartan 25 mg tablet 1 tab(s) orally once a day     ---    * metoprolol 100 mg tablet 1 tab(s) orally 2 times a day     ---    * escitalopram 10 mg tablet 1.5 tab(s) orally once a day     ---    * atorvastatin 40 mg tablet 1 tab(s) orally once a day (at bedtime)     ---    * gabapentin 300 mg capsule 1 cap(s) orally 2 times a day     ---    * Tradjenta 5 mg tablet 1 tab(s) orally once a day     ---    Discontinued    * Penlac Nail Lacquer 8% solution 1 app applied topically once a day     ---    * naproxen 500 mg tablet 1 tab(s) orally 2 times a day     ---    * Bactrim DS 800 mg-160 mg tablet 1 tab(s) orally every 12 hours     ---    * Shingrix Vaccine 0.5 ml solution 1 dose IM     ---    * Shingrix Vaccine 0.5 ml solution 1 dose IM     ---    * Keflex 500 mg capsule 1 cap(s) orally every 8 hours     ---    * Medication List reviewed and reconciled with the patient     ---          * * *          Provider: Mirna Mires 10/28/2020    ---    Note generated by eClinicalWorks EMR/PM Software ([email or web address])

## 2020-11-07 ENCOUNTER — Ambulatory Visit (HOSPITAL_BASED_OUTPATIENT_CLINIC_OR_DEPARTMENT_OTHER): Admitting: Family

## 2020-11-07 NOTE — Progress Notes (Signed)
* * *      [name], [name] **DOB:** 1951-08-20 (70 yo F) **Acc No.** [id number] **DOS:**  11/07/2020    ---        [name], [name]**    ------    52 Y old Female, DOB: 1951/07/31    [address], [place], [place] [address]    Home: [phone]    Provider: Mirna Mires        * * *    Web Encounter    ---    Answered by    Staff, Clinical    Date: 11/07/2020        Time: 04:32 PM    Caller    Mazal Yeh    ------            Reason    Pioglitazone - call 2/18            Message                      Addressed To: Laveda Abbe. Is there any updates if pioglitazone could be increased?                Action Taken                      Tona Sensing, [place]  11/17/2020 10:16:50 AM > multiple messages regarding this, but I don't see pt daughter got an answer?      Burgundy Matuszak  11/17/2020 6:40:49 PM > Hi [name], Your voicemail is currently full. I will try calling you again tomorrow. We can definitely review increasing it but I would like to discuss the warning signs to look out for with this class of medication such as heart failure warning signs. Best regards, [name], NP-C                    * * *        **eMessages**   From:    Mirna Mires    ------    Created:    2020-11-17 18:42:30.0    Sent:    2020-11-17 18:42:53.0    Subject:    ZE:[id number]    Message:                      Mirna Mires  11/17/2020 6:40:49 PM > Hi [name], Your voicemail is currently full. I will try calling you again tomorrow. We can definitely review increasing it but I would like to discuss the warning signs to look out for with this class of medication such as heart failure warning signs. Best regards, [name], NP-C                        ---          * * *          Provider: Mirna Mires 11/07/2020    ---    Note generated by eClinicalWorks EMR/PM Software ([email or web address])

## 2020-11-10 ENCOUNTER — Ambulatory Visit (HOSPITAL_BASED_OUTPATIENT_CLINIC_OR_DEPARTMENT_OTHER): Admitting: Family

## 2020-11-10 NOTE — Progress Notes (Signed)
* * *      Nuno, Leydy L **DOB:** [birth year] (70 yo F) **Acc No.** [id number] **DOS:**  11/10/2020    ---        [name], Aaralyn L**    ------    5 Y old Female, DOB: 08/25/1951    [address] [place], [place] [address]    Home: [phone]    Provider: Mirna Mires        * * *    Web Encounter    ---    Answered by    Staff, Clinical    Date: 11/10/2020        Time: 10:36 AM    Caller    Abbygale Pund    ------            Reason    Pioglitazone            Message                      Addressed To: Mirna Mires        Is there any updates if pioglitazone could be increased??                Action Taken                      [name]  11/10/2020 10:45:15 AM > see TE 2/7 re. same      Mirna Mires  11/11/2020 6:51:32 PM > see other TE                    * * *                ---          * * *          Provider: Mirna Mires 11/10/2020    ---    Note generated by eClinicalWorks EMR/PM Software ([email or web address])

## 2020-11-18 ENCOUNTER — Ambulatory Visit (HOSPITAL_BASED_OUTPATIENT_CLINIC_OR_DEPARTMENT_OTHER): Admitting: Family

## 2020-11-18 NOTE — Progress Notes (Signed)
* * *      [name], [name] **DOB:** 23-Jul-1951 (70 yo F) **Acc No.** [id number] **DOS:**  11/18/2020    ---        [name], [name]**    ------    35 Y old Female, DOB: 05-05-1951    [address] [place], [place] [address]    Home: [phone]    Provider: Mirna Mires        * * *    Web Encounter    ---    Answered by    [name]    Date: 11/18/2020        Time: 04:36 PM    Caller    [name]    ------            Reason    Update Demographics - Personal Info            Message                      Please Update Demographic Information                    * * *                ---          * * *          Provider: Mirna Mires 11/18/2020    ---    Note generated by eClinicalWorks EMR/PM Software ([email or web address])

## 2020-11-18 NOTE — Progress Notes (Signed)
* * *    [name], [name] **DOB:** 1951/08/15 (70 yo F) **Acc No.** [id number] **DOS:**  11/18/2020    ---        [name], [name]**    ------    27 Y old Female, DOB: 1951-06-10    [address] [place], [place] [address]    Home: [phone]    Provider: Mirna Mires        * * *    Web Encounter    ---    Answered by    Staff, Clinical    Date: 11/18/2020        Time: 01:02 PM    Caller    [name]    ------            Reason    Re:RE:Pioglitazone            Message                      Addressed To: Laveda Abbe,                        I have cleared my voicemail.Marland Kitchen Please call me or my sister [name] [phone]. Actually if you could call [name] that would be better. She is more aware of medications.                        Thank you.                                                     _____________________________________________________________________            To :[name]            From :Clinical Staff            Sent :11/17/2020 06:42 PM            Subject :ZS:[id number]                        Preston Weill  11/17/2020 6:40:49 PM > Hi [name], Your voicemail is currently full. I will try calling you again tomorrow. We can definitely review increasing it but I would like to discuss the warning signs to look out for with this class of medication such as heart failure warning signs. Best regards, [name] Car, NP-C                Action Taken                      [name],[name]  11/18/2020 2:54:01 PM >      [name]  11/23/2020 9:04:08 AM > Reviewed need for annual eye exams, CHF warning signs. Will send increase. Pt being seen for follow-up in one month.                Refills    Stop Pioglitazone Hydrochloride tablet, 15 mg, orally, 1 tab(s),  once a day    ------      Start pioglitazone tablet, 30 mg, orally, 30 tablet, 1 tab(s), once a day,  30 day(s), Refills=2      Stop Tradjenta tablet, 5 mg, orally,  1 tab(s), once a day          * * *                 ---          * * *          Provider: Mirna Mires 11/18/2020    ---    Note generated by eClinicalWorks EMR/PM Software ([email or web address])

## 2020-11-23 ENCOUNTER — Ambulatory Visit (HOSPITAL_BASED_OUTPATIENT_CLINIC_OR_DEPARTMENT_OTHER): Admitting: Family

## 2020-11-23 NOTE — Progress Notes (Signed)
* * *      [name], [name] **DOB:** November 15, 1950 (70 yo F) **Acc No.** [id number] **DOS:**  11/23/2020    ---        [name], [name]**    ------    65 Y old Female, DOB: 10/05/1950    [address] [place], [place] [address]    Home: [phone]    Provider: Mirna [name]        * * *    Web Encounter    ---    Answered by    Staff, Clinical    Date: 11/23/2020        Time: 12:02 PM    Caller    [name]    ------            Reason    New Refill Request            Message                      ***Start of Medication List: ***       gabapentin 300 mg 1 cap(s)  orally 2 times a day 30 days              #60 with no refill(s)      ***End of Med List ***       Comments:please provide 90 day supply with refills                Action Taken                      fernandes,sahrena  11/23/2020 12:06:11 PM > last rx 10/13/20, last seen 09/13/20, shes looking for a 90 day supply, ok for refill?      [name]  11/23/2020 9:15:34 PM > Sent as 90 day                Refills    Stop amoxicillin tablet, 875 mg, orally, 1 tab(s), every 12 hours    ------      Refill gabapentin capsule, 300 mg, orally, 180 Capsule, 1 cap(s), 2 times a  day, 90 days, Refills=0          * * *                ---          * * *          Provider: Mirna [name] 11/23/2020    ---    Note generated by eClinicalWorks EMR/PM Software ([email or web address])

## 2020-11-28 ENCOUNTER — Ambulatory Visit (HOSPITAL_BASED_OUTPATIENT_CLINIC_OR_DEPARTMENT_OTHER): Admitting: Family

## 2020-11-28 NOTE — Progress Notes (Signed)
* * *      [name], [name] **DOB:** June 04, 1951 (70 yo F) **Acc No.** [id number] **DOS:**  11/28/2020    ---        [name], [name]**    ------    38 Y old Female, DOB: 20-Nov-1950    [address] [place], [place] [address]    Home: [phone]    Provider: Mirna Mires        * * *    Web Encounter    ---    Answered by    Staff, Clinical    Date: 11/28/2020        Time: 02:28 PM    Caller    Gracia Pincock    ------            Reason    New Refill Request            Message                      ***Start of Medication List: ***       metFORMIN 1000MG  1 tab(s)  orally 2 times a day 90 days              #180 Tablet with no refill(s)      ***End of Med List ***       Comments:                Action Taken                      fernandes,sahrena  11/28/2020 2:44:49 PM > script sent,                 Refills    Refill metFORMIN tablet, 1000MG , orally, 180 Tablet, 1 tab(s), 2  times a day, 90 days, Refills=0    ------          * * *        **eMessages**   From:    [name]    ------    Created:    2020-11-28 14:45:50.0    Sent:      Subject:    RE:New Refill Request    Message:                      [name]  11/28/2020 2:44:49 PM > script sent,                        ---          * * *          Provider: Mirna Mires 11/28/2020    ---    Note generated by eClinicalWorks EMR/PM Software ([email or web address])

## 2021-01-07 ENCOUNTER — Encounter (HOSPITAL_BASED_OUTPATIENT_CLINIC_OR_DEPARTMENT_OTHER)

## 2021-01-08 ENCOUNTER — Other Ambulatory Visit

## 2021-01-12 ENCOUNTER — Other Ambulatory Visit (HOSPITAL_BASED_OUTPATIENT_CLINIC_OR_DEPARTMENT_OTHER): Admitting: Family Medicine

## 2021-01-12 ENCOUNTER — Inpatient Hospital Stay: Admit: 2021-01-12 | Payer: PRIVATE HEALTH INSURANCE | Primary: Family Medicine

## 2021-01-12 DIAGNOSIS — Z1231 Encounter for screening mammogram for malignant neoplasm of breast: Secondary | ICD-10-CM

## 2021-01-16 ENCOUNTER — Other Ambulatory Visit (HOSPITAL_BASED_OUTPATIENT_CLINIC_OR_DEPARTMENT_OTHER): Admitting: Family Medicine

## 2021-02-09 ENCOUNTER — Other Ambulatory Visit: Admit: 2021-02-09 | Payer: PRIVATE HEALTH INSURANCE | Primary: Family Medicine

## 2021-02-09 ENCOUNTER — Other Ambulatory Visit

## 2021-02-09 DIAGNOSIS — R7989 Other specified abnormal findings of blood chemistry: Secondary | ICD-10-CM

## 2021-02-09 LAB — CBC WITH DIFFERENTIAL
Basophils %: 0.5 %
Basophils Absolute: 0.06 10*3/uL (ref 0.00–0.22)
Eosinophils %: 4.3 %
Eosinophils Absolute: 0.48 10*3/uL (ref 0.00–0.50)
Hematocrit: 37.4 % (ref 32.0–47.0)
Hemoglobin: 10.7 g/dL — ABNORMAL LOW (ref 11.0–16.0)
Immature Granulocytes %: 0.3 %
Immature Granulocytes Absolute: 0.03 10*3/uL (ref 0.00–0.10)
Lymphocyte %: 28.5 %
Lymphocytes Absolute: 3.18 10*3/uL (ref 0.70–4.00)
MCH: 25 pg — ABNORMAL LOW (ref 26.0–34.0)
MCHC: 28.6 g/dL — ABNORMAL LOW (ref 31.0–37.0)
MCV: 87.4 fL (ref 80.0–100.0)
Monocytes %: 7.3 %
Monocytes Absolute: 0.81 10*3/uL — ABNORMAL HIGH (ref 0.36–0.77)
NRBC %: 0 % (ref 0.0–0.0)
NRBC Absolute: 0 10*3/uL (ref 0.00–2.00)
Neutrophil %: 59.1 %
Neutrophils Absolute: 6.61 10*3/uL (ref 1.50–7.95)
Platelets: 177 10*3/uL (ref 150–400)
RBC: 4.28 M/uL (ref 3.70–5.20)
RDW-CV: 13.7 % (ref 11.5–14.5)
RDW-SD: 43.5 fL (ref 35.0–51.0)
WBC: 11.2 10*3/uL — ABNORMAL HIGH (ref 4.0–11.0)

## 2021-02-09 LAB — TSH WITH REFLEX: TSH: 3.3 u[IU]/mL (ref 0.358–3.740)

## 2021-02-09 LAB — HEMOGLOBIN A1C
Estimated Average Glucose mg/dL (INT/EXT): 180 mg/dL
HEMOGLOBIN A1C % (INT/EXT): 7.9 % — ABNORMAL HIGH (ref <5.6)

## 2021-02-15 ENCOUNTER — Encounter (INDEPENDENT_AMBULATORY_CARE_PROVIDER_SITE_OTHER)

## 2021-02-16 ENCOUNTER — Other Ambulatory Visit

## 2021-02-16 ENCOUNTER — Ambulatory Visit (INDEPENDENT_AMBULATORY_CARE_PROVIDER_SITE_OTHER): Admitting: Family

## 2021-02-16 ENCOUNTER — Encounter (INDEPENDENT_AMBULATORY_CARE_PROVIDER_SITE_OTHER): Admitting: Family

## 2021-02-16 ENCOUNTER — Ambulatory Visit: Admit: 2021-02-16 | Payer: PRIVATE HEALTH INSURANCE | Attending: Family | Primary: Family Medicine

## 2021-02-16 VITALS — BP 118/72 | Ht <= 58 in | Wt 172.0 lb

## 2021-02-16 DIAGNOSIS — E039 Hypothyroidism, unspecified: Secondary | ICD-10-CM

## 2021-02-16 NOTE — Progress Notes (Signed)
[facility]  [facility]  [address]  [address]  [place] [address]  Dept: [phone]  Dept Fax: [phone]     Patient ID: [name] is a 70 y.o. female who presents for follow-up.  Subjective   Patient presents today for DM follow-up. Her A1C is now under 8 percent and improved from prior. She has no concerns at this time.       Objective   Visit Vitals  BP 118/72   Ht 1.473 m   Wt 78 kg   BMI 35.95 kg/m?   BSA 1.79 m?       Physical Exam  Vitals reviewed.   Constitutional:       Appearance: Normal appearance.   Cardiovascular:      Rate and Rhythm: Normal rate and regular rhythm.      Pulses: Normal pulses.      Heart sounds: Normal heart sounds.   Skin:     General: Skin is warm.   Neurological:      Mental Status: She is alert.   Psychiatric:         Mood and Affect: Mood normal.         Behavior: Behavior normal.         Assessment/Plan   Hypothyroidism, unspecified type  -     TSH with reflex; Future  Type 2 diabetes mellitus with diabetic neuropathy, unspecified whether long term insulin use (CMS/HCC)  -     Hemoglobin A1c; Future    A1C improved. Was 8.5%, now 7.9%. Continue dietary changes, focus on 150 min/week of moderate activity. Will recheck A1C and thyroid with future labs of which pt is in agreement.

## 2021-02-23 ENCOUNTER — Encounter (INDEPENDENT_AMBULATORY_CARE_PROVIDER_SITE_OTHER): Admitting: Family

## 2021-02-23 MED ORDER — metFORMIN (Glucophage) 1,000 mg tablet
1000 | ORAL_TABLET | Freq: Two times a day (BID) | ORAL | 1 refills | Status: DC
Start: 2021-02-23 — End: 2021-08-23

## 2021-02-23 MED ORDER — pioglitazone (Actos) 30 mg tablet
30 | ORAL_TABLET | Freq: Every day | ORAL | 0 refills | 90.00000 days | Status: DC
Start: 2021-02-23 — End: 2021-03-30

## 2021-02-23 MED ORDER — gabapentin (Neurontin) 300 mg capsule
300 | ORAL_CAPSULE | Freq: Two times a day (BID) | ORAL | 0 refills | Status: DC
Start: 2021-02-23 — End: 2021-03-30

## 2021-03-02 ENCOUNTER — Other Ambulatory Visit (INDEPENDENT_AMBULATORY_CARE_PROVIDER_SITE_OTHER)

## 2021-03-02 NOTE — Telephone Encounter (Signed)
-----   Message from [place] L. Fors sent at 03/02/2021  4:41 PM EDT -----  Regarding: Medication  Okay thanks. I also need a refill on the clotrimazole/betamethasone cream

## 2021-03-02 NOTE — Telephone Encounter (Signed)
I don't see this medication on the med list?

## 2021-03-16 ENCOUNTER — Ambulatory Visit: Payer: PRIVATE HEALTH INSURANCE | Attending: Family | Primary: Family Medicine

## 2021-03-16 ENCOUNTER — Ambulatory Visit (INDEPENDENT_AMBULATORY_CARE_PROVIDER_SITE_OTHER): Admitting: Family

## 2021-03-29 ENCOUNTER — Encounter (INDEPENDENT_AMBULATORY_CARE_PROVIDER_SITE_OTHER): Admitting: Family

## 2021-03-30 ENCOUNTER — Other Ambulatory Visit (INDEPENDENT_AMBULATORY_CARE_PROVIDER_SITE_OTHER): Admitting: Family Medicine

## 2021-03-30 MED ORDER — pioglitazone (Actos) 30 mg tablet
30 | ORAL_TABLET | Freq: Every day | ORAL | 0 refills | 90.00000 days | Status: DC
Start: 2021-03-30 — End: 2021-05-02

## 2021-03-30 MED ORDER — atorvastatin (Lipitor) 40 mg tablet
40 | ORAL_TABLET | Freq: Every evening | ORAL | 0 refills | 90.00000 days | Status: DC
Start: 2021-03-30 — End: 2021-06-30

## 2021-03-30 MED ORDER — gabapentin (Neurontin) 300 mg capsule
300 | ORAL_CAPSULE | Freq: Two times a day (BID) | ORAL | 0 refills | Status: DC
Start: 2021-03-30 — End: 2021-05-02

## 2021-03-30 MED ORDER — losartan (Cozaar) 25 mg tablet
25 | ORAL_TABLET | Freq: Every day | ORAL | 0 refills | 90.00000 days | Status: DC
Start: 2021-03-30 — End: 2021-06-30

## 2021-03-30 MED ORDER — metoprolol tartrate (Lopressor) 100 mg tablet
100 | ORAL_TABLET | Freq: Two times a day (BID) | ORAL | 0 refills | Status: DC
Start: 2021-03-30 — End: 2021-05-17

## 2021-03-30 NOTE — Telephone Encounter (Signed)
-----   Message from [place] L. Fofana sent at 03/29/2021  8:52 PM EDT -----  Regarding: Refills  Hi I need refills on Escitalopram,  Atorvastatin, Pioglitazone, Gabapentin, losartan, and Metoprolol.  Please include refills and 90 day supply.

## 2021-03-31 ENCOUNTER — Other Ambulatory Visit (INDEPENDENT_AMBULATORY_CARE_PROVIDER_SITE_OTHER): Admitting: Family

## 2021-03-31 MED ORDER — escitalopram (Lexapro) 10 mg tablet
10 | ORAL_TABLET | Freq: Every day | ORAL | 0 refills | Status: DC
Start: 2021-03-31 — End: 2021-05-02

## 2021-03-31 NOTE — Telephone Encounter (Signed)
-----   Message from Russell L. Wise sent at 03/30/2021  5:22 PM EDT -----  Regarding: Refills  I received a message saying 5 of the 6 prescriptions were approved. Please refill escitalopram too.

## 2021-05-01 ENCOUNTER — Encounter (INDEPENDENT_AMBULATORY_CARE_PROVIDER_SITE_OTHER): Admitting: Family

## 2021-05-02 MED ORDER — escitalopram (Lexapro) 10 mg tablet
10 | ORAL_TABLET | Freq: Every day | ORAL | 0 refills | Status: DC
Start: 2021-05-02 — End: 2021-05-30

## 2021-05-02 MED ORDER — pioglitazone (Actos) 30 mg tablet
30 | ORAL_TABLET | Freq: Every day | ORAL | 0 refills | 90.00000 days | Status: DC
Start: 2021-05-02 — End: 2021-05-30

## 2021-05-02 MED ORDER — gabapentin (Neurontin) 300 mg capsule
300 | ORAL_CAPSULE | Freq: Two times a day (BID) | ORAL | 0 refills | Status: DC
Start: 2021-05-02 — End: 2021-05-30

## 2021-05-02 NOTE — Telephone Encounter (Signed)
 From: Ronney Asters  To: Mirna Mires, FNP  Sent: 05/01/2021 6:53 PM EDT  Subject: Refills    Hi,   I need a refill on escitalopram, pioglitazone, and gabapentin. Please make them a 90 day supply with refills . Thank you.

## 2021-05-16 ENCOUNTER — Encounter (INDEPENDENT_AMBULATORY_CARE_PROVIDER_SITE_OTHER): Admitting: Family

## 2021-05-18 MED ORDER — metoprolol tartrate (Lopressor) 100 mg tablet
100 | ORAL_TABLET | Freq: Two times a day (BID) | ORAL | 1 refills | Status: DC
Start: 2021-05-18 — End: 2021-05-30

## 2021-05-30 ENCOUNTER — Encounter (INDEPENDENT_AMBULATORY_CARE_PROVIDER_SITE_OTHER): Admitting: Family

## 2021-05-31 MED ORDER — gabapentin (Neurontin) 300 mg capsule
300 | ORAL_CAPSULE | Freq: Two times a day (BID) | ORAL | 0 refills | Status: DC
Start: 2021-05-31 — End: 2021-06-23

## 2021-05-31 MED ORDER — metoprolol tartrate (Lopressor) 100 mg tablet
100 | ORAL_TABLET | Freq: Two times a day (BID) | ORAL | 1 refills | Status: DC
Start: 2021-05-31 — End: 2021-08-17

## 2021-05-31 MED ORDER — escitalopram (Lexapro) 10 mg tablet
10 | ORAL_TABLET | Freq: Every day | ORAL | 0 refills | Status: DC
Start: 2021-05-31 — End: 2021-06-27

## 2021-05-31 MED ORDER — pioglitazone (Actos) 30 mg tablet
30 | ORAL_TABLET | Freq: Every day | ORAL | 1 refills | 90.00000 days | Status: DC
Start: 2021-05-31 — End: 2021-06-23

## 2021-06-01 ENCOUNTER — Encounter (INDEPENDENT_AMBULATORY_CARE_PROVIDER_SITE_OTHER): Admitting: Family

## 2021-06-08 ENCOUNTER — Other Ambulatory Visit: Admit: 2021-06-08 | Payer: PRIVATE HEALTH INSURANCE | Primary: Family Medicine

## 2021-06-08 ENCOUNTER — Other Ambulatory Visit

## 2021-06-08 DIAGNOSIS — E039 Hypothyroidism, unspecified: Secondary | ICD-10-CM

## 2021-06-08 LAB — HEMOGLOBIN A1C
Estimated Average Glucose mg/dL (INT/EXT): 169 mg/dL
HEMOGLOBIN A1C % (INT/EXT): 7.5 % — ABNORMAL HIGH (ref <5.6)

## 2021-06-08 LAB — TSH WITH REFLEX: TSH: 2.44 u[IU]/mL (ref 0.358–3.740)

## 2021-06-15 ENCOUNTER — Encounter (INDEPENDENT_AMBULATORY_CARE_PROVIDER_SITE_OTHER): Admitting: Family

## 2021-06-15 ENCOUNTER — Other Ambulatory Visit

## 2021-06-15 ENCOUNTER — Ambulatory Visit
Admit: 2021-06-15 | Discharge: 2021-06-15 | Payer: PRIVATE HEALTH INSURANCE | Attending: Family | Primary: Family Medicine

## 2021-06-15 ENCOUNTER — Encounter

## 2021-06-15 VITALS — BP 114/62 | HR 51 | Wt 171.0 lb

## 2021-06-15 DIAGNOSIS — E1140 Type 2 diabetes mellitus with diabetic neuropathy, unspecified: Secondary | ICD-10-CM

## 2021-06-15 LAB — POCT MICROALBUMIN: Microalbumin, POC: NORMAL

## 2021-06-15 MED ORDER — escitalopram (Lexapro) 5 mg tablet
5 | ORAL_TABLET | Freq: Every day | ORAL | 2 refills | Status: DC
Start: 2021-06-15 — End: 2021-06-27

## 2021-06-15 NOTE — Progress Notes (Signed)
 [facility]  [facility]  [address]  [address]  [place] [address]  Dept: [phone]  Dept Fax: [phone]     Patient ID: [name] is a 70 y.o. female who presents for Diabetes (Follow up , last eye exam was jan of 2022 , no concerns. Last a1c 7.5).    Subjective   Patient presents today for a follow-up examination.    Patient presents today for a diabetes follow-up.     Last A1C and date:  7.5 at this time, down from 7.9 in April.   Last urine micro: Performed today.  Last diabetic foot exam: Performed today.  Last vision examination: 2022 with Dr. Selena Batten at [facility]. Due for exam repeat in January which has been scheduled.  Excessive thirst, hunger or urination? Pt. denies.   Thyroid: TSH normal at this time.     Pt with some worsening mood symptoms. Bickering with spouse, recently said I wish I was dead.           Objective   Visit Vitals  BP 114/62 (BP Location: Left arm, Patient Position: Sitting, BP Cuff Size: Adult)   Pulse 51   Wt 77.6 kg Comment: 171lbs   SpO2 97%   BMI 35.74 kg/m   BSA 1.78 m       Physical Exam  Vitals reviewed.   Constitutional:       Appearance: Normal appearance.   Cardiovascular:      Rate and Rhythm: Normal rate and regular rhythm.      Pulses: Normal pulses.      Heart sounds: Normal heart sounds.   Feet:      Right foot:      Protective Sensation: 9 sites tested. 9 sites sensed.      Skin integrity: Skin integrity normal.      Left foot:      Protective Sensation: 9 sites tested. 9 sites sensed.      Skin integrity: Skin integrity normal.      Comments: Normal foot examination, regular pulses with good skin integrity and sensation.  Skin:     General: Skin is warm.   Neurological:      Mental Status: She is alert.   Psychiatric:         Mood and Affect: Mood normal.         Behavior: Behavior normal.         Assessment/Plan   [name] was seen today for diabetes.  Type 2 diabetes mellitus with diabetic neuropathy, without long-term current use of  insulin (CMS/HCC)  -     POCT microalbumin  Obesity, morbid (CMS/HCC)  Comments:  Reinforced low-glycemic diet, increasing physical activity, avoiding getting into heavy carb foods as weather gets cooler.  Obesity, Class II, BMI 35-39.9  Comments:  Plan as noted above.   Adjustment disorder with depressed mood  Comments:  Will increase lexapro by 5 mg qd. Advised portal update or OV in two weeks to review for symptom improvement. Advised to contact the office if sx worsen.  Orders:  -     escitalopram (Lexapro) 5 mg tablet; Take 1 tablet (5 mg) by mouth in the morning.

## 2021-06-16 ENCOUNTER — Encounter (INDEPENDENT_AMBULATORY_CARE_PROVIDER_SITE_OTHER): Admitting: Family

## 2021-06-21 MED ORDER — clotrimazole-betameth dip-zinc 1-0.05-20 % combo pack
1-0.05-20 | Freq: Two times a day (BID) | TOPICAL | 0 refills | 30.00000 days | Status: DC
Start: 2021-06-21 — End: 2021-06-27

## 2021-06-22 ENCOUNTER — Encounter (INDEPENDENT_AMBULATORY_CARE_PROVIDER_SITE_OTHER): Admitting: Family

## 2021-06-22 NOTE — Telephone Encounter (Signed)
 Sent!

## 2021-06-27 ENCOUNTER — Other Ambulatory Visit (INDEPENDENT_AMBULATORY_CARE_PROVIDER_SITE_OTHER): Admitting: Family

## 2021-06-27 MED ORDER — escitalopram (Lexapro) 20 mg tablet
20 | ORAL_TABLET | Freq: Every day | ORAL | 1 refills | Status: DC
Start: 2021-06-27 — End: 2021-12-29

## 2021-06-27 MED ORDER — gabapentin (Neurontin) 300 mg capsule
300 | ORAL_CAPSULE | Freq: Two times a day (BID) | ORAL | 3 refills | Status: AC
Start: 2021-06-27 — End: 2022-06-22

## 2021-06-27 MED ORDER — pioglitazone (Actos) 30 mg tablet
30 | ORAL_TABLET | Freq: Every day | ORAL | 3 refills | 90.00000 days | Status: DC
Start: 2021-06-27 — End: 2021-12-08

## 2021-06-27 MED ORDER — clotrimazole-betamethasone (Lotrisone) cream
1-0.05 | Freq: Two times a day (BID) | TOPICAL | 0 refills | 28.00000 days | Status: AC
Start: 2021-06-27 — End: 2021-07-11

## 2021-06-27 NOTE — Patient Instructions (Signed)
 daughter here and is asking about this

## 2021-06-29 ENCOUNTER — Other Ambulatory Visit (INDEPENDENT_AMBULATORY_CARE_PROVIDER_SITE_OTHER): Admitting: Family Medicine

## 2021-08-08 ENCOUNTER — Ambulatory Visit (HOSPITAL_BASED_OUTPATIENT_CLINIC_OR_DEPARTMENT_OTHER): Admitting: Family

## 2021-08-08 ENCOUNTER — Encounter (INDEPENDENT_AMBULATORY_CARE_PROVIDER_SITE_OTHER): Admitting: Family Medicine

## 2021-08-08 ENCOUNTER — Ambulatory Visit
Admit: 2021-08-08 | Discharge: 2021-08-08 | Payer: PRIVATE HEALTH INSURANCE | Attending: Family | Primary: Family Medicine

## 2021-08-08 ENCOUNTER — Other Ambulatory Visit

## 2021-08-08 ENCOUNTER — Inpatient Hospital Stay
Admit: 2021-08-08 | Disposition: A | Discharge status: Home / Self Care | Payer: PRIVATE HEALTH INSURANCE | Primary: Family Medicine

## 2021-08-08 VITALS — BP 138/80 | HR 54 | Temp 98.5°F

## 2021-08-08 DIAGNOSIS — J029 Acute pharyngitis, unspecified: Secondary | ICD-10-CM

## 2021-08-08 LAB — POCT INFLUENZA A/B
POCT Influenza A Ag: NEGATIVE
POCT Influenza B Ag: NEGATIVE

## 2021-08-08 LAB — POCT RAPID STREP A: POCT Strep A Ag: NEGATIVE

## 2021-08-08 LAB — SARS COV 2 RNA BY PCR: COVID-19 (SARS-CoV-2 PCR): NOT DETECTED

## 2021-08-08 MED ORDER — benzonatate (Tessalon) 100 mg capsule
100 | ORAL_CAPSULE | Freq: Three times a day (TID) | ORAL | 0 refills | 10.00000 days | Status: AC | PRN
Start: 2021-08-08 — End: 2021-08-15

## 2021-08-08 MED ORDER — albuterol 90 mcg/actuation inhaler
90 | Freq: Four times a day (QID) | RESPIRATORY_TRACT | 11 refills | Status: DC | PRN
Start: 2021-08-08 — End: 2021-09-14

## 2021-08-08 MED ORDER — fluticasone (Flonase) 50 mcg/actuation nasal spray
50 | Freq: Every day | NASAL | 5 refills | Status: AC
Start: 2021-08-08 — End: 2022-08-08

## 2021-08-08 NOTE — Progress Notes (Signed)
 [facility]  [facility]  [address]  [address]  [place] [address]  Dept: [phone]  Dept Fax: [phone]     Patient ID: [name] is a 70 y.o. female who presents for body aches and sore throat.     Subjective   Patient presents today with c/o of ST which started on Saturday. Then developed frontal sinus pressure, cough, congestion, ear fullness which started yesterday. No fever or chills. No nausea, vomiting, or abdominal pain. She has tried Tylenol with no real improvement. Took two home COVID-19 tests which were negative. No known sick contacts.    Objective   Visit Vitals  BP 138/80   Pulse 54   Temp 36.9 ?C (98.5 ?F)   SpO2 98%       Physical Exam  Vitals reviewed.   Constitutional:       Appearance: Normal appearance.   HENT:      Head: Normocephalic.      Right Ear: Tympanic membrane normal.      Left Ear: Tympanic membrane normal.      Nose: Congestion present. No rhinorrhea.      Mouth/Throat:      Pharynx: Posterior oropharyngeal erythema present. No oropharyngeal exudate.   Eyes:      Conjunctiva/sclera: Conjunctivae normal.   Cardiovascular:      Rate and Rhythm: Normal rate and regular rhythm.      Pulses: Normal pulses.      Heart sounds: Normal heart sounds.   Pulmonary:      Effort: Pulmonary effort is normal.      Breath sounds: Normal breath sounds.   Skin:     General: Skin is warm.   Neurological:      Mental Status: She is alert.   Psychiatric:         Mood and Affect: Mood normal.         Behavior: Behavior normal.         Assessment/Plan   Adonai was seen today for body aches sore throat  2 neg covid test.  Pharyngitis, unspecified etiology  -     POCT rapid strep A  -     POCT Influenza A/B  -     Throat culture; Future  -     COVID-19 PCR Testing; Future  -     albuterol 90 mcg/actuation inhaler; Inhale 2 puffs every 6 (six) hours if needed for wheezing.  -     fluticasone (Flonase) 50 mcg/actuation nasal spray; Administer 2 sprays into each nostril in the  morning. Shake gently. Before first use, prime pump. After use, clean tip and replace cap.  -     benzonatate (Tessalon) 100 mg capsule; Take 1 capsule (100 mg) by mouth if needed in the morning, at noon, and at bedtime for cough for up to 7 days. Do not crush or chew.  -     Throat culture  Nasal congestion  -     POCT Influenza A/B  -     COVID-19 PCR Testing; Future  -     fluticasone (Flonase) 50 mcg/actuation nasal spray; Administer 2 sprays into each nostril in the morning. Shake gently. Before first use, prime pump. After use, clean tip and replace cap.    Negative flu and strep testing. Advised pt to go for COVID pcr resting today. Flonase and tessalon perles for symptomatic relief measures. If COVID testing negative and sx worsen or persist would consider abx.  Pt in agreement with plan. Orders placed.

## 2021-08-08 NOTE — Other (Signed)
 Patient Education   Table of Contents       Sinusitis, Adult       Sore Throat     To view videos and all your education online visit,   https://pe.elsevier.com/epfxgrd   or scan this QR code with your smartphone.                    Sinusitis, Adult         Sinusitis is inflammation of your sinuses. Sinuses are hollow spaces in the bones around your face. Your sinuses are located:       Around your eyes.       In the middle of your forehead.       Behind your nose.       In your cheekbones.     Mucus normally drains out of your sinuses. When your nasal tissues become inflamed or swollen, mucus can become trapped or blocked. This allows bacteria, viruses, and fungi to grow, which leads to infection. Most infections of the sinuses are caused by a virus.   Sinusitis can develop quickly. It can last for up to 4 weeks (acute) or for more than 12 weeks (chronic). Sinusitis often develops after a cold.     What are the causes?    This condition is caused by anything that creates swelling in the sinuses or stops mucus from draining. This includes:       Allergies.       Asthma.       Infection from bacteria or viruses.       Deformities or blockages in your nose or sinuses.       Abnormal growths in the nose (nasal polyps).       Pollutants, such as chemicals or irritants in the air.       Infection from fungi (rare).     What increases the risk?    You are more likely to develop this condition if you:       Have a weak body defense system (immune system).       Do a lot of swimming or diving.       Overuse nasal sprays.       Smoke.     What are the signs or symptoms?    The main symptoms of this condition are pain and a feeling of pressure around the affected sinuses. Other symptoms include:       Stuffy nose or congestion.       Thick drainage from your nose.       Swelling and warmth over the affected sinuses.       Headache.       Upper toothache.       A cough that may get worse at night.       Extra mucus that  collects in the throat or the back of the nose (postnasal drip).       Decreased sense of smell and taste.       Fatigue.       A fever.       Sore throat.       Bad breath.     How is this diagnosed?    This condition is diagnosed based on:       Your symptoms.       Your medical history.       A physical exam.      Tests to find out if your condition is  acute or chronic. This may include:       Checking your nose for nasal polyps.       Viewing your sinuses using a device that has a light (endoscope).       Testing for allergies or bacteria.       Imaging tests, such as an MRI or CT scan.     In rare cases, a bone biopsy may be done to rule out more serious types of fungal sinus disease.   How is this treated?    Treatment for sinusitis depends on the cause and whether your condition is chronic or acute.      If caused by a virus, your symptoms should go away on their own within 10 days. You may be given medicines to relieve symptoms. They include:       Medicines that shrink swollen nasal passages (topical intranasal decongestants).       Medicines that treat allergies (antihistamines).       A spray that eases inflammation of the nostrils (topical intranasal corticosteroids).       Rinses that help get rid of thick mucus in your nose (nasal saline washes).      If caused by bacteria, your health care provider may recommend waiting to see if your symptoms improve. Most bacterial infections will get better without antibiotic medicine. You may be given antibiotics if you have:       A severe infection.       A weak immune system.       If caused by narrow nasal passages or nasal polyps, you may need to have surgery.     Follow these instructions at home:   Medicines         Take, use, or apply over-the-counter and prescription medicines only as told by your health care provider. These may include nasal sprays.       If you were prescribed an antibiotic medicine, take it as told by your health care provider. Do not   stop taking the antibiotic even if you start to feel better.     Hydrate and humidify            Drink enough fluid to keep your urine pale yellow. Staying hydrated will help to thin your mucus.       Use a cool mist humidifier to keep the humidity level in your home above 50%.       Inhale steam for 10?15 minutes, 3?4 times a day, or as told by your health care provider. You can do this in the bathroom while a hot shower is running.       Limit your exposure to cool or dry air.     Rest         Rest as much as possible.       Sleep with your head raised (elevated).       Make sure you get enough sleep each night.     General instructions            Apply a warm, moist washcloth to your face 3?4 times a day or as told by your health care provider. This will help with discomfort.       Wash your hands often with soap and water to reduce your exposure to germs. If soap and water are not available, use hand sanitizer.      Do not  smoke. Avoid being around people who are smoking (secondhand smoke).  Keep all follow-up visits as told by your health care provider. This is important.         Contact a health care provider if:         You have a fever.       Your symptoms get worse.       Your symptoms do not improve within 10 days.     Get help right away if:         You have a severe headache.       You have persistent vomiting.       You have severe pain or swelling around your face or eyes.       You have vision problems.       You develop confusion.       Your neck is stiff.       You have trouble breathing.     Summary         Sinusitis is soreness and inflammation of your sinuses. Sinuses are hollow spaces in the bones around your face.       This condition is caused by nasal tissues that become inflamed or swollen. The swelling traps or blocks the flow of mucus. This allows bacteria, viruses, and fungi to grow, which leads to infection.       If you were prescribed an antibiotic medicine, take it as told by  your health care provider. Do not  stop taking the antibiotic even if you start to feel better.       Keep all follow-up visits as told by your health care provider. This is important.     This information is not intended to replace advice given to you by your health care provider. Make sure you discuss any questions you have with your health care provider.     Document Released: 12/18/2006Document Revised: 05/20/2019Document Reviewed: 02/17/2018     Elsevier Patient Education ? 2022 Elsevier Inc.         Sore Throat     When you have a sore throat, your throat may feel:       Tender.       Burning.       Irritated.       Scratchy.       Painful when you swallow.       Painful when you talk.      Many things can cause a sore throat, such as:       An infection.       Allergies.       Dry air.       Smoke or pollution.       Radiation treatment for cancer.       Gastroesophageal reflux disease (GERD).       A tumor.      A sore throat can be the first sign of another sickness. It can happen with other problems, like:       Coughing.       Sneezing.       Fever.       Swelling of the glands in the neck.     Most sore throats go away without treatment.   Follow these instructions at home:           Medicines         Take over-the-counter and prescription medicines only as told by your doctor.       Children often get sore throats. Do not  give  your child aspirin.       Use throat sprays to soothe your throat as told by your health care provider.     Managing pain    To help with pain:       Sip warm liquids, such as broth, herbal tea, or warm water.       Eat or drink cold or frozen liquids, such as frozen ice pops.      Rinse your mouth (gargle) with a salt water mixture 3?4 times a day or as needed.       To make salt water, dissolve ??1 tsp (3?6 g) of salt in 1 cup (237 mL) of warm water.      Do not  swallow this mixture.       Suck on hard candy or throat lozenges.       Put a cool-mist humidifier in your bedroom  at night.       Sit in the bathroom with the door closed for 5?10 minutes while you run hot water in the shower.     General instructions        Do not  smoke or use any products that contain nicotine or tobacco. If you need help quitting, ask your doctor.       Get plenty of rest.       Drink enough fluid to keep your pee (urine) pale yellow.       Wash your hands often for at least 20 seconds with soap and water. If soap and water are not available, use hand sanitizer.       Contact a doctor if:         You have a fever for more than 2?3 days.       You keep having symptoms for more than 2?3 days.       Your throat does not get better in 7 days.       You have a fever and your symptoms suddenly get worse.       Your child who is 3 months to 69 years old has a temperature of 102.2?F (39?C) or higher.     Get help right away if:         You have trouble breathing.       You cannot swallow fluids, soft foods, or your spit.       You have swelling in your throat or neck that gets worse.       You feel like you may vomit (nauseous) and this feeling lasts a long time.       You cannot stop vomiting.     These symptoms may be an emergency. Get help right away. Call your local emergency services (911 in the [place]      Do not wait to see if the symptoms will go away.      Do not drive yourself to the hospital.     Summary         A sore throat is a painful, burning, irritated, or scratchy throat. Many things can cause a sore throat.       Take over-the-counter medicines only as told by your doctor.       Get plenty of rest.       Drink enough fluid to keep your pee (urine) pale yellow.       Contact a doctor if your symptoms get worse or your sore throat does not get better within 7 days.  This information is not intended to replace advice given to you by your health care provider. Make sure you discuss any questions you have with your health care provider.     Document Released: 09/26/2009Document Revised:  03/16/2022Document Reviewed: 12/14/2020     Elsevier Patient Education ? 2022 Elsevier Inc.

## 2021-08-10 LAB — THROAT CULTURE: Throat Culture: NORMAL

## 2021-08-17 ENCOUNTER — Encounter (INDEPENDENT_AMBULATORY_CARE_PROVIDER_SITE_OTHER): Admitting: Family

## 2021-08-17 MED ORDER — metoprolol tartrate (Lopressor) 100 mg tablet
100 | ORAL_TABLET | Freq: Two times a day (BID) | ORAL | 1 refills | Status: DC
Start: 2021-08-17 — End: 2021-11-15

## 2021-08-23 ENCOUNTER — Other Ambulatory Visit (INDEPENDENT_AMBULATORY_CARE_PROVIDER_SITE_OTHER): Admitting: Family Medicine

## 2021-08-23 NOTE — Telephone Encounter (Signed)
 RA out of office, ty.   Last ov 06/15/2021.

## 2021-08-25 ENCOUNTER — Encounter (INDEPENDENT_AMBULATORY_CARE_PROVIDER_SITE_OTHER)

## 2021-08-25 MED ORDER — nirmatrelvir-ritonavir 300 mg (150 mg x 2)-100 mg tablets,dose pack dose pack
300 | ORAL_TABLET | Freq: Two times a day (BID) | ORAL | 0 refills | 5.00000 days | Status: AC
Start: 2021-08-25 — End: 2021-08-30

## 2021-08-25 NOTE — Progress Notes (Signed)
 [facility]  [facility]  [address]  [address]  [place] [address]  Dept: [phone]  Dept Fax: [phone]     Patient ID: [name] is a 70 y.o. female who presents for after hour. covid pos (Call from daughter 11:30a.  Returned call 1pm).    Subjective  started w URI s/s 2 days ago.  Reporting cough, suffy nose, HA, aches.  No GI s/s.  No respiratory distress.  Has been vaccinated for covid   HPI        Objective   There were no vitals taken for this visit.    Physical Exam    Assessment/Plan   Keyatta was seen today for after hour. covid pos.  COVID-19 virus infection    Quarantine for 10 days.  Symptomatic caer.  Home pulse ox if avail and ER if < 93%.  paxlovid sent

## 2021-09-14 ENCOUNTER — Other Ambulatory Visit

## 2021-09-14 ENCOUNTER — Ambulatory Visit: Payer: PRIVATE HEALTH INSURANCE | Attending: Family | Primary: Family Medicine

## 2021-09-14 ENCOUNTER — Encounter (INDEPENDENT_AMBULATORY_CARE_PROVIDER_SITE_OTHER): Admitting: Family Medicine

## 2021-09-14 ENCOUNTER — Other Ambulatory Visit: Admit: 2021-09-14 | Payer: PRIVATE HEALTH INSURANCE | Primary: Family Medicine

## 2021-09-14 ENCOUNTER — Ambulatory Visit
Admit: 2021-09-14 | Discharge: 2021-09-14 | Payer: PRIVATE HEALTH INSURANCE | Attending: Family Medicine | Primary: Family Medicine

## 2021-09-14 ENCOUNTER — Encounter

## 2021-09-14 VITALS — BP 122/64 | HR 55 | Ht <= 58 in | Wt 172.4 lb

## 2021-09-14 DIAGNOSIS — D649 Anemia, unspecified: Secondary | ICD-10-CM

## 2021-09-14 DIAGNOSIS — I10 Essential (primary) hypertension: Secondary | ICD-10-CM

## 2021-09-14 DIAGNOSIS — Z0000 Encounter for general adult medical examination without abnormal findings: Secondary | ICD-10-CM

## 2021-09-14 LAB — COMPREHENSIVE METABOLIC PANEL
ALT: 25 U/L (ref 0–55)
AST: 15 U/L (ref 6–42)
Albumin: 3.8 g/dL (ref 3.2–5.0)
Alkaline phosphatase: 84 U/L (ref 30–130)
Anion Gap: 4 mmol/L (ref 3–14)
BUN: 22 mg/dL (ref 6–24)
Bilirubin, total: 0.3 mg/dL (ref 0.2–1.2)
CO2 (Bicarbonate): 28 mmol/L (ref 20–32)
Calcium: 9.2 mg/dL (ref 8.5–10.5)
Chloride: 108 mmol/L (ref 98–110)
Creatinine: 1.32 mg/dL — ABNORMAL HIGH (ref 0.55–1.30)
Glucose: 169 mg/dL — ABNORMAL HIGH (ref 70–110)
Potassium: 5.2 mmol/L (ref 3.6–5.2)
Protein, total: 7.2 g/dL (ref 6.0–8.4)
Sodium: 140 mmol/L (ref 135–146)
eGFRcr: 44 mL/min/{1.73_m2} — ABNORMAL LOW (ref >=60)

## 2021-09-14 LAB — CBC WITH DIFFERENTIAL
Basophils %: 0.6 %
Basophils Absolute: 0.07 10*3/uL (ref 0.00–0.22)
Eosinophils %: 4.8 %
Eosinophils Absolute: 0.59 10*3/uL — ABNORMAL HIGH (ref 0.00–0.50)
Hematocrit: 33.8 % (ref 32.0–47.0)
Hemoglobin: 10 g/dL — ABNORMAL LOW (ref 11.0–16.0)
Immature Granulocytes %: 0.5 %
Immature Granulocytes Absolute: 0.06 10*3/uL (ref 0.00–0.10)
Lymphocyte %: 25.8 %
Lymphocytes Absolute: 3.18 10*3/uL (ref 0.70–4.00)
MCH: 24.6 pg — ABNORMAL LOW (ref 26.0–34.0)
MCHC: 29.6 g/dL — ABNORMAL LOW (ref 31.0–37.0)
MCV: 83.3 fL (ref 80.0–100.0)
MPV: 11.6 fL (ref 9.1–12.4)
Monocytes %: 7.1 %
Monocytes Absolute: 0.88 10*3/uL — ABNORMAL HIGH (ref 0.36–0.77)
NRBC %: 0 % (ref 0.0–0.0)
NRBC Absolute: 0 10*3/uL (ref 0.00–2.00)
Neutrophil %: 61.2 %
Neutrophils Absolute: 7.55 10*3/uL (ref 1.50–7.95)
Platelets: 234 10*3/uL (ref 150–400)
RBC: 4.06 M/uL (ref 3.70–5.20)
RDW-CV: 14.8 % — ABNORMAL HIGH (ref 11.5–14.5)
RDW-SD: 45 fL (ref 35.0–51.0)
WBC: 12.3 10*3/uL — ABNORMAL HIGH (ref 4.0–11.0)

## 2021-09-14 LAB — IRON, FERRITIN, TIBC
Ferritin: 6.5 ng/mL — ABNORMAL LOW (ref 10.0–307.0)
Iron Saturation: 13 % (ref 10–50)
Iron: 64 ug/dL (ref 30–180)
TIBC: 493 ug/dL — ABNORMAL HIGH (ref 250–463)

## 2021-09-14 NOTE — Progress Notes (Signed)
 [facility]  [facility]  [address]  [address]  [place] [address]  Dept: [phone]  Dept Fax: [phone]     Patient ID: [name] is a 70 y.o. female who presents for Annual Exam (Medicare wellness. Concenrs about vein on legs ).    Subjective   [name] is a 70 y.o. female and is here for a comprehensive physical exam.     The patient reports no new concerns    Do you take any herbs or supplements that were not prescribed by a doctor? MV, Calium, Vit D    Type 2 DM Diabetes:  Hemoglobin A1C-7.5  Urine microalbumin-30  Eye exam-10/2020  Monofilament test- normal  Podiatrist- 2x a year  Taking Gabapentin for neuropathy.    Hypertension  On Losartan and Metoprolol    GERD  Taking Omeprazole    Depression  Taking Lexapro    Dentist- has not seen in a while. Mostly dentures  Eye exam every year  Podiatrist- 2x a year.     SCREENING:  Mammogram-01/12/21  Colonoscopy- Refuses- Wants to do Insure Fit    IMMUNIZATIONS:  Td-12/14/16  Received 2 doses of COVID vaccine  UTD on Pneumonia vaccine    MSSP: Have you fallen in the past year? No  Diabetes Is the patientis A1C in control- Yes  Depression Was the patient screened for depression in the current year? Yes- On Lexapro  Breast Cancer Screening Patient screened within 2 years? Yes  Colorectal Cancer Screening- Refuses- she will do Insure Fit  Patient had: Influenza Vaccination for current year (administered in office or elsewhere)-Yes  Tobacco Use Was the patient screened for tobacco use (current year)- Non smoker  Statin Therapy Does the patient have any of the following:Yes  Cost of medication discussed on:  09/13/21     Review of Systems   Constitutional: Negative.    HENT: Negative.    Eyes: Negative.    Respiratory: Negative.    Cardiovascular: Negative.    Gastrointestinal: Negative.    Genitourinary: Negative.    Musculoskeletal: Negative.    Skin: Negative.    Neurological: Negative.    Endo/Heme/Allergies: Negative.     Psychiatric/Behavioral: Negative.        Patient Active Problem List   Diagnosis   . Adjustment disorder with depressed mood   . Benign paroxysmal positional vertigo   . Closed bimalleolar fracture   . Closed fracture of talus   . Dysfunction of both eustachian tubes   . GERD (gastroesophageal reflux disease)   . Hypercholesterolemia   . Intertrigo   . Essential hypertension   . Knee buckling   . Obesity (BMI 35.0-39.9 without comorbidity)   . Plantar fasciitis of right foot   . Pulmonary nodule   . Thoracic back pain   . Type 2 diabetes mellitus (CMS/HCC)   . Type 2 diabetes mellitus with diabetic neuropathy, unspecified (CMS/HCC)   . Stage 3b chronic kidney disease (CMS/HCC)   . Anemia   . Screening for colon cancer   . Need for influenza vaccination   . Routine adult health maintenance     Current Outpatient Medications   Medication Instructions   . aspirin 81 mg EC tablet 1 tablet, oral, Daily   . atorvastatin (Lipitor) 40 mg tablet TAKE 1 TABLET BY MOUTH AT BEDTIME   . chlorhexidine (Peridex) 0.12 % solution SMARTSIG:5 Milliliter(s) By Mouth Morning-Evening   . cholecalciferol (Vitamin D-3) 25 MCG (1000 UT) capsule  1 capsule, oral, Daily   . escitalopram (LEXAPRO) 20 mg, oral, Daily   . fluticasone (Flonase) 50 mcg/actuation nasal spray 2 sprays, Each Nostril, Daily, Shake gently. Before first use, prime pump. After use, clean tip and replace cap.   . gabapentin (NEURONTIN) 300 mg, oral, 2 times daily   . losartan (Cozaar) 25 mg tablet TAKE 1 TABLET BY MOUTH IN THE MORNING   . metFORMIN (Glucophage) 1,000 mg tablet TAKE 1 TABLET BY MOUTH IN THE MORNING AND 1 AT BEDTIME   . metoprolol tartrate (LOPRESSOR) 100 mg, oral, 2 times daily   . multivit with minerals/lutein (MULTIVITAMIN 50 PLUS ORAL) 1 tablet, oral, Daily RT   . omeprazole OTC (PriLOSEC OTC) 20 mg EC tablet 1 tablet, oral, Daily   . pioglitazone (ACTOS) 30 mg, oral, Daily     Allergies   Allergen Reactions   . Citalopram Hallucinations   .  Glimepiride Dizziness   . Lisinopril Cough     Past Medical History:   Diagnosis Date   . Adjustment disorder with depressed mood    . Adjustment disorder with depressed mood 02/15/2021   . GERD (gastroesophageal reflux disease)    . Hypertension    . Intertrigo    . Knee buckling    . Plantar fasciitis of right foot    . Pulmonary nodule    . Thoracic back pain    . Type 2 diabetes mellitus (CMS/HCC)    . Type 2 diabetes mellitus with diabetic neuropathy, unspecified (CMS/HCC)      Past Surgical History:   Procedure Laterality Date   . ANKLE SURGERY     . ECTOPIC PREGNANCY SURGERY       Family History   Problem Relation Name Age of Onset   . Diabetes Mother     . Other (cirrhoisis of the liver) Mother     . Heart attack Father     . Hypertension Father     . Heart disease Father     . Other (ovarian/uterus CA) Sister     . Hypertension Sister     . Diabetes Sister     . Diabetes Brother     . Hypertension Brother       Social History     Tobacco Use   . Smoking status: Never   . Smokeless tobacco: Never   Vaping Use   . Vaping Use: Never used   Substance Use Topics   . Alcohol use: Never   . Drug use: Never     Objective   Visit Vitals  BP 122/64 (BP Location: Left arm, Patient Position: Sitting, BP Cuff Size: Large adult)   Pulse 55   Ht 1.473 m   Wt 78.2 kg   SpO2 99%   BMI 36.03 kg/m   BSA 1.79 m     Physical Exam  Vitals reviewed.   Constitutional:       Appearance: Normal appearance. She is well-developed. She is obese.   HENT:      Head: Normocephalic and atraumatic.      Right Ear: Tympanic membrane, ear canal and external ear normal.      Left Ear: Tympanic membrane, ear canal and external ear normal.      Nose: Nose normal.      Mouth/Throat:      Pharynx: Oropharynx is clear.   Eyes:      General: Lids are normal.      Extraocular Movements: Extraocular movements intact.  Conjunctiva/sclera: Conjunctivae normal.      Pupils: Pupils are equal, round, and reactive to light.   Cardiovascular:      Rate  and Rhythm: Normal rate and regular rhythm.      Heart sounds: Normal heart sounds. No murmur heard.  Pulmonary:      Effort: Pulmonary effort is normal.      Breath sounds: Normal breath sounds and air entry.   Chest:   Breasts:     Right: Normal. No mass, skin change or tenderness.      Left: Normal. No mass, skin change or tenderness.   Abdominal:      General: Bowel sounds are normal.      Palpations: Abdomen is soft.      Tenderness: There is no abdominal tenderness.   Musculoskeletal:         General: Normal range of motion.      Cervical back: Full passive range of motion without pain, normal range of motion and neck supple.      Right lower leg: No edema.      Left lower leg: No edema.      Comments: Monofilament test Normal   Lymphadenopathy:      Upper Body:      Right upper body: No axillary adenopathy.      Left upper body: No axillary adenopathy.   Skin:     Findings: No lesion or rash.   Neurological:      General: No focal deficit present.      Mental Status: She is alert and oriented to person, place, and time.      Motor: Motor function is intact.      Coordination: Coordination is intact.      Gait: Gait is intact.   Psychiatric:         Mood and Affect: Mood and affect normal.         Behavior: Behavior normal.         Thought Content: Thought content normal.       Assessment/Plan   [name] was seen today for annual exam.  Routine adult health maintenance  Assessment & Plan:  Normal exam. Mammogram is up to date. Declines colonoscopy- she will do Insure fit- kit given.  I discussed healthy diet and regular exercise regimen. Routine eye and dental exam. Immunizations updated- Flu vaccine given today.  Essential hypertension  Assessment & Plan:  Controlled. Continue current medication. DASH diet.  Orders:  -     Comprehensive metabolic panel; Future  Anemia, unspecified type  Assessment & Plan:  Check CBC and Iron  Orders:  -     CBC and differential - WAM and non-WAM; Future  -     IRON, FERRITIN,  AND TIBC; Future  Screening for colon cancer  -     Occult Blood, Fecal, Immunoassay -Colorectal Neoplasm Screen; Future  Stage 3b chronic kidney disease (CMS/HCC)  Assessment & Plan:  Stable. Avoid nephrotoxic drugs.  Need for influenza vaccination  -     Influenza vaccine, Quadrivalent, Adjuvanted  Type 2 diabetes mellitus with diabetic neuropathy, without long-term current use of insulin (CMS/HCC)  Assessment & Plan:  Hemoglobin A1C around the goal which is 7.5%. Continue current medications. Eye exam up to date. Monofilament test is normal. Diabetic diet.  Gastroesophageal reflux disease without esophagitis  Assessment & Plan:  Stable. On Omeprazole.Avoid dietary trigger.  Adjustment disorder with depressed mood  Assessment & Plan:  PHQ9 score is 5. Continue Lexapro.  Counseling given.  Obesity (BMI 35.0-39.9 without comorbidity)  Assessment & Plan:  Discussed healthy diet and regular exercise  Hypercholesterolemia  Assessment & Plan:  Continue Atorvastatin.

## 2021-09-19 ENCOUNTER — Encounter (INDEPENDENT_AMBULATORY_CARE_PROVIDER_SITE_OTHER): Admitting: Family Medicine

## 2021-09-19 DIAGNOSIS — Z0000 Encounter for general adult medical examination without abnormal findings: Secondary | ICD-10-CM

## 2021-09-19 NOTE — Assessment & Plan Note (Signed)
 PHQ9 score is 5. Continue Lexapro. Counseling given.

## 2021-09-19 NOTE — Assessment & Plan Note (Signed)
 Stable.  Avoid nephrotoxic drugs

## 2021-09-19 NOTE — Assessment & Plan Note (Signed)
 Discussed healthy diet and regular exercise.

## 2021-09-19 NOTE — Assessment & Plan Note (Signed)
Continue Atorvastatin

## 2021-09-19 NOTE — Assessment & Plan Note (Signed)
 Check CBC and Iron

## 2021-09-19 NOTE — Assessment & Plan Note (Signed)
Normal exam. Mammogram is up to date. Declines colonoscopy- she will do Insure fit- kit given.  I discussed healthy diet and regular exercise regimen. Routine eye and dental exam. Immunizations updated- Flu vaccine given today.

## 2021-09-19 NOTE — Assessment & Plan Note (Signed)
Controlled   Continue current medication   DASH diet

## 2021-09-19 NOTE — Assessment & Plan Note (Signed)
 Hemoglobin A1C around the goal which is 7.5%. Continue current medications. Eye exam up to date. Monofilament test is normal. Diabetic diet.

## 2021-09-19 NOTE — Assessment & Plan Note (Signed)
 Stable. On Omeprazole.Avoid dietary trigger.

## 2021-09-26 ENCOUNTER — Other Ambulatory Visit (INDEPENDENT_AMBULATORY_CARE_PROVIDER_SITE_OTHER): Admitting: Family Medicine

## 2021-11-15 ENCOUNTER — Other Ambulatory Visit (INDEPENDENT_AMBULATORY_CARE_PROVIDER_SITE_OTHER): Admitting: Family

## 2021-11-16 MED ORDER — metoprolol tartrate (Lopressor) 100 mg tablet
100 | ORAL_TABLET | Freq: Two times a day (BID) | ORAL | 1 refills | Status: DC
Start: 2021-11-16 — End: 2022-02-12

## 2021-11-28 ENCOUNTER — Other Ambulatory Visit (INDEPENDENT_AMBULATORY_CARE_PROVIDER_SITE_OTHER): Admitting: Family

## 2021-11-28 MED ORDER — metFORMIN (Glucophage) 1,000 mg tablet
1000 | ORAL_TABLET | Freq: Two times a day (BID) | ORAL | 3 refills | Status: AC
Start: 2021-11-28 — End: 2022-11-28

## 2021-11-28 NOTE — Telephone Encounter (Signed)
I will refill Metformin while Dr. Dot BeenMolato is out, but she needs to schedule her annual physical w/Dr. Dot BeenMolato.

## 2021-12-08 ENCOUNTER — Other Ambulatory Visit (INDEPENDENT_AMBULATORY_CARE_PROVIDER_SITE_OTHER): Admitting: Family Medicine

## 2021-12-09 NOTE — Telephone Encounter (Signed)
PE - 09/14/2021

## 2021-12-11 ENCOUNTER — Other Ambulatory Visit (INDEPENDENT_AMBULATORY_CARE_PROVIDER_SITE_OTHER): Admitting: Family

## 2021-12-11 NOTE — Telephone Encounter (Signed)
Last filled 096/27/22 last office visit 09/14/21

## 2021-12-12 MED ORDER — pioglitazone (Actos) 30 mg tablet
30 | ORAL_TABLET | Freq: Every day | ORAL | 3 refills | 90.00000 days | Status: AC
Start: 2021-12-12 — End: 2022-12-07

## 2021-12-29 MED ORDER — escitalopram (Lexapro) 20 mg tablet
20 | ORAL_TABLET | Freq: Every day | ORAL | 1 refills | Status: DC
Start: 2021-12-29 — End: 2022-03-19

## 2021-12-29 MED ORDER — losartan (Cozaar) 25 mg tablet
25 | ORAL_TABLET | Freq: Every morning | ORAL | 0 refills | 90.00000 days | Status: DC
Start: 2021-12-29 — End: 2022-03-19

## 2021-12-29 MED ORDER — atorvastatin (Lipitor) 40 mg tablet
40 | ORAL_TABLET | Freq: Every evening | ORAL | 0 refills | 90.00000 days | Status: AC
Start: 2021-12-29 — End: ?

## 2022-01-18 ENCOUNTER — Ambulatory Visit: Payer: PRIVATE HEALTH INSURANCE | Primary: Family Medicine

## 2022-01-18 ENCOUNTER — Encounter

## 2022-01-18 DIAGNOSIS — Z1231 Encounter for screening mammogram for malignant neoplasm of breast: Secondary | ICD-10-CM

## 2022-02-14 MED ORDER — metoprolol tartrate (Lopressor) 100 mg tablet
100 | ORAL_TABLET | Freq: Two times a day (BID) | ORAL | 0 refills | Status: DC
Start: 2022-02-14 — End: 2022-03-19

## 2022-02-19 ENCOUNTER — Inpatient Hospital Stay
Admit: 2022-02-19 | Disposition: A | Discharge status: Home / Self Care | Attending: Nurse Practitioner | Primary: Family Medicine

## 2022-02-19 DIAGNOSIS — J020 Streptococcal pharyngitis: Principal | ICD-10-CM

## 2022-02-19 LAB — POCT SARS-COV-2/FLU A&B
POCT Flu A: NOT DETECTED
POCT Flu B: NOT DETECTED
POCT SARS-CoV-2: NOT DETECTED

## 2022-02-19 LAB — POCT STREP A PCR: POCT Strep A PCR: DETECTED — AB

## 2022-02-19 MED ORDER — amoxicillin (Amoxil) 500 mg capsule
500 | ORAL_CAPSULE | Freq: Three times a day (TID) | ORAL | 0 refills | Status: AC
Start: 2022-02-19 — End: 2022-03-01

## 2022-02-19 NOTE — ED Provider Notes (Signed)
History  Chief Complaint   Patient presents with   . Sore Throat   . Fever     71 year old [place] speaking female, family is present and interpreting.  Patient has complained of a sore throat for the past 3 days.  Yesterday she developed fever and chills there has been no associated vomiting or diarrhea.  There is no abdominal pain or headache.  No chest pain or shortness of breath.  Patient is able to swallow it is just painful.  Family indicates that she had COVID in November of last year.  Patient is COVID vaccinated      History provided by:  Patient  Language interpreter used: No        Past Medical History:   Diagnosis Date   . Adjustment disorder with depressed mood    . Adjustment disorder with depressed mood 02/15/2021   . GERD (gastroesophageal reflux disease)    . Hypertension    . Intertrigo    . Knee buckling    . Plantar fasciitis of right foot    . Pulmonary nodule    . Thoracic back pain    . Type 2 diabetes mellitus (CMS/HCC)    . Type 2 diabetes mellitus with diabetic neuropathy, unspecified (CMS/HCC)        Past Surgical History:   Procedure Laterality Date   . ANKLE SURGERY     . ECTOPIC PREGNANCY SURGERY         Family History   Problem Relation Name Age of Onset   . Diabetes Mother     . Other (cirrhoisis of the liver) Mother     . Heart attack Father     . Hypertension Father     . Heart disease Father     . Other (ovarian/uterus CA) Sister     . Hypertension Sister     . Diabetes Sister     . Diabetes Brother     . Hypertension Brother         Social History     Tobacco Use   . Smoking status: Never   . Smokeless tobacco: Never   Vaping Use   . Vaping status: Never Used     Passive vaping exposure: Yes   Substance Use Topics   . Alcohol use: Never   . Drug use: Never       Review of Systems   Constitutional: Positive for chills and fever.   HENT: Positive for sore throat. Negative for trouble swallowing and voice change.    Eyes: Negative.    Gastrointestinal: Negative for abdominal  pain, diarrhea, nausea and vomiting.   Skin: Negative.    Neurological: Negative for headaches.   Psychiatric/Behavioral: Negative.        Physical Exam  Vitals:    02/19/22 1620   BP: 129/65   BP Location: Right arm   Pulse: 59   Resp: 18   Temp: 36.9 C (98.4 F)   TempSrc: Oral   SpO2: 98%   Weight: 72.6 kg   Height: 1.473 m       Physical Exam  Vitals reviewed.   HENT:      Head: Normocephalic.      Right Ear: Tympanic membrane normal.      Left Ear: Tympanic membrane normal.      Nose: No congestion or rhinorrhea.      Mouth/Throat:      Pharynx: Posterior oropharyngeal erythema present. No pharyngeal swelling or oropharyngeal exudate.  Cardiovascular:      Rate and Rhythm: Normal rate.      Heart sounds: Normal heart sounds.   Pulmonary:      Effort: Pulmonary effort is normal.      Breath sounds: Normal breath sounds.   Musculoskeletal:      Cervical back: Normal range of motion.   Lymphadenopathy:      Cervical: Cervical adenopathy present.   Skin:     General: Skin is warm and dry.   Neurological:      General: No focal deficit present.      Mental Status: She is alert and oriented to person, place, and time.   Psychiatric:         Mood and Affect: Mood normal.              No orders to display     Labs Reviewed   POCT STREP A PCR - Abnormal       Result Value    POCT Strep A PCR Detected (*)    POCT SARS-COV-2/FLU A&B - Normal    POCT SARS-CoV-2 Not Detected      POCT Flu A Not Detected      POCT Flu B Not Detected      Narrative:     This test was performed with the [employer] [facility] SARS-COV-2 & Influenza A/B RNA by RT PCR molecular test. Not detected results do not rule out infection with COVID-19, Influenza A and/or Influenza B. This test is offered under Emergency use Authorization by the FDA.       Procedures  Procedures    UC Course  Diagnoses as of 02/19/22 1653   Acute pharyngitis, unspecified etiology   Strep throat       Medical Decision Making  HPI  71 year old [place] speaking female, family is  present and interpreting.  Patient has complained of a sore throat for the past 3 days.  Yesterday she developed fever and chills there has been no associated vomiting or diarrhea.  There is no abdominal pain or headache.  No chest pain or shortness of breath.  Patient is able to swallow it is just painful.  Family indicates that she had COVID in November of last year.  Patient is COVID vaccinated    Vital signs reviewed and unremarkable    Exam  Alert and oriented  Well-hydrated  Oropharynx with erythematous borders along the tonsils  Voice is normal  Lungs are clear  Mild cervical lymphadenopathy is noted    COVID and influenza negative  Strep positive    We will begin amoxicillin  Symptoms management reviewed and advised  Follow-up with primary care doctor for any ongoing symptoms advised      Acute pharyngitis, unspecified etiology: complicated acute illness or injury  Strep throat: complicated acute illness or injury  Amount and/or Complexity of Data Reviewed  Labs: ordered.      Risk  Prescription drug management.          Discharge Meds  ED Prescriptions     Medication Sig Dispense Start Date End Date Auth. Provider    amoxicillin (Amoxil) 500 mg capsule Take 1 capsule (500 mg) by mouth three times daily for 10 days. 30 capsule 02/19/2022 03/01/2022 Paschal Dopp, NP          Home Meds  Prior to Admission medications    Medication Sig Start Date End Date Taking? Authorizing Provider   aspirin 81 mg EC tablet Take 1 tablet by mouth  in the morning.    Historical Provider, MD   atorvastatin (Lipitor) 40 mg tablet Take 1 tablet (40 mg) by mouth at bedtime. 12/29/21   Emelyn Molato, MD   chlorhexidine (Peridex) 0.12 % solution SMARTSIG:5 Milliliter(s) By Mouth Morning-Evening 02/13/21   Historical Provider, MD   cholecalciferol (Vitamin D-3) 25 MCG (1000 UT) capsule Take 1 capsule by mouth in the morning.    Historical Provider, MD   escitalopram (Lexapro) 20 mg tablet Take 1 tablet (20 mg) by mouth once daily. 12/29/21  06/27/22  Emelyn Molato, MD   fluticasone (Flonase) 50 mcg/actuation nasal spray Administer 2 sprays into each nostril in the morning. Shake gently. Before first use, prime pump. After use, clean tip and replace cap. 08/08/21 08/08/22  Mirna Mires, FNP   gabapentin (Neurontin) 300 mg capsule Take 1 capsule (300 mg) by mouth in the morning and at bedtime. 06/27/21 06/22/22  Mirna Mires, FNP   losartan (Cozaar) 25 mg tablet Take 1 tablet (25 mg) by mouth in the morning. 12/29/21   Peggye Form, MD   metFORMIN (Glucophage) 1,000 mg tablet Take 1 tablet (1,000 mg) by mouth with breakfast and with evening meal. 11/28/21 11/28/22  Denna Haggard, NP   metoprolol tartrate (Lopressor) 100 mg tablet Take 1 tablet (100 mg) by mouth twice daily. 02/14/22   Peggye Form, MD   multivit with minerals/lutein (MULTIVITAMIN 50 PLUS ORAL) Take 1 tablet by mouth in the morning. 12/29/12   Historical Provider, MD   omeprazole OTC (PriLOSEC OTC) 20 mg EC tablet Take 1 tablet by mouth in the morning. 12/29/12   Historical Provider, MD   pioglitazone (Actos) 30 mg tablet Take 1 tablet (30 mg) by mouth in the morning. 12/12/21 12/07/22  Emelyn Molato, MD         Total amount of time spent on day of service doing chart review, history and physical exam, order/ review results of testing ordered (if any), patient counseling, documentation: 25 minutes.      Patient encounter note may have been created using voice recognition software and in real time during the office visit. Please excuse any typographical errors that may not have been edited out.            Paschal Dopp, NP  02/19/22 [phone]

## 2022-02-19 NOTE — Discharge Instructions (Addendum)
Begin amoxicillin and take as directed until finished  Tylenol 650 mg every 4 hours as needed for pain and a temperature greater than 100.9  Warm salt water gargles  Fluids and vitamin C  Change oral hygiene products out 24 hours after beginning the antibiotic  For any worsening or new symptoms please report to the emergency department

## 2022-03-15 ENCOUNTER — Ambulatory Visit
Admit: 2022-03-15 | Discharge: 2022-03-15 | Payer: PRIVATE HEALTH INSURANCE | Attending: Family Medicine | Primary: Family Medicine

## 2022-03-15 ENCOUNTER — Encounter

## 2022-03-15 ENCOUNTER — Other Ambulatory Visit: Admit: 2022-03-15 | Primary: Family Medicine

## 2022-03-15 DIAGNOSIS — E1140 Type 2 diabetes mellitus with diabetic neuropathy, unspecified: Secondary | ICD-10-CM

## 2022-03-15 LAB — POCT MICROALBUMIN: Microalbumin, POC: <30

## 2022-03-15 LAB — HEMOGLOBIN A1C
Estimated Average Glucose mg/dL (INT/EXT): 151 mg/dL
HEMOGLOBIN A1C % (INT/EXT): 6.9 % — ABNORMAL HIGH (ref <5.6)

## 2022-03-15 NOTE — Progress Notes (Signed)
[facility]  [facility]  [address]  [address]  [place] [address]  Dept: [phone]  Dept Fax: [phone]     Patient ID: [name] is a 71 y.o. female who presents for Follow-up (6 mos F/U).    Subjective   71 year old female presents for follow up of chronic medical issues.    Type 2 DM  Eye exam- 10/2021- will get report  Hemoglobin A1C-6.9%  Albumin=10  Monofilament test done today- normal.   Medications: metformin and Pioglitazone    Hypertension:  Doing well  Medications: Metoprolol and Losartan.     Taking Lexapro for depression which helps.      Review of Systems   Constitutional: Negative.    Eyes: Negative.    Respiratory: Negative.    Cardiovascular: Negative.    Gastrointestinal: Negative.    Genitourinary: Negative.    Neurological: Negative.    Psychiatric/Behavioral: Positive for depression.        Stable.     Patient Active Problem List   Diagnosis   . Adjustment disorder with depressed mood   . Benign paroxysmal positional vertigo   . Closed bimalleolar fracture   . Closed fracture of talus   . Dysfunction of both eustachian tubes   . GERD (gastroesophageal reflux disease)   . Hypercholesterolemia   . Intertrigo   . Essential hypertension   . Knee buckling   . Obesity (BMI 35.0-39.9 without comorbidity)   . Plantar fasciitis of right foot   . Pulmonary nodule   . Thoracic back pain   . Type 2 diabetes mellitus (CMS/HCC)   . Type 2 diabetes mellitus with diabetic neuropathy, unspecified (CMS/HCC)   . Stage 3b chronic kidney disease (CMS/HCC)   . Anemia   . Screening for colon cancer   . Need for influenza vaccination   . Routine adult health maintenance     Current Outpatient Medications   Medication Instructions   . aspirin 81 mg EC tablet 1 tablet, oral, Daily   . atorvastatin (LIPITOR) 40 mg, oral, Nightly   . cholecalciferol (Vitamin D-3) 25 MCG (1000 UT) capsule 1 capsule, oral, Daily   . escitalopram (LEXAPRO) 20 mg, oral, Daily   . fluticasone (Flonase) 50  mcg/actuation nasal spray 2 sprays, Each Nostril, Daily, Shake gently. Before first use, prime pump. After use, clean tip and replace cap.   . gabapentin (NEURONTIN) 300 mg, oral, 2 times daily   . losartan (COZAAR) 25 mg, oral, Every morning   . metFORMIN (GLUCOPHAGE) 1,000 mg, oral, 2 times daily with meals   . metoprolol tartrate (LOPRESSOR) 100 mg, oral, 2 times daily   . multivit with minerals/lutein (MULTIVITAMIN 50 PLUS ORAL) 1 tablet, oral, Daily RT   . omeprazole OTC (PRILOSEC OTC) 20 mg, oral, Daily   . pioglitazone (ACTOS) 30 mg, oral, Daily     Allergies   Allergen Reactions   . Citalopram Hallucinations   . Glimepiride Dizziness   . Lisinopril Cough     Social History     Tobacco Use   . Smoking status: Never   . Smokeless tobacco: Never   Vaping Use   . Vaping Use: Never used   Substance Use Topics   . Alcohol use: Never   . Drug use: Never     Objective   Visit Vitals  BP 120/70 (BP Location: Left arm, Patient Position: Sitting, BP Cuff Size: Adult)   Pulse 67   Resp 16  Ht 1.473 m   Wt 77.6 kg   SpO2 99%   BMI 35.74 kg/m   BSA 1.78 m     Physical Exam  Vitals reviewed.   Constitutional:       Appearance: Normal appearance.   Cardiovascular:      Rate and Rhythm: Normal rate and regular rhythm.      Heart sounds: Normal heart sounds. No murmur heard.  Pulmonary:      Effort: Pulmonary effort is normal.      Breath sounds: Normal breath sounds.   Musculoskeletal:      Comments: Monofilament test- normal.    Neurological:      General: No focal deficit present.      Mental Status: She is alert and oriented to person, place, and time.   Psychiatric:         Mood and Affect: Mood normal.         Behavior: Behavior normal.       Assessment/Plan   [name] was seen today for follow-up.  Type 2 diabetes mellitus with diabetic neuropathy, without long-term current use of insulin (CMS/HCC)  Assessment & Plan:  Controlled. Hemoglobin A1C is at goal. Continue current medications. Diabetic diet. Monofilament test  normal. Urine microalbumin is also normal. Had eye exam in January per daughter - will get report. Diabetic diet.   Orders:  -     POCT microalbumin  -     Hemoglobin A1c; Future  Essential hypertension  Assessment & Plan:  Controlled. Continue current medication.   Gastroesophageal reflux disease without esophagitis  Assessment & Plan:  Stable. Continue Omeprazole. Avoid dietary trigger.  Orders:  -     omeprazole OTC (PriLOSEC OTC) 20 mg EC tablet; Take 1 tablet (20 mg) by mouth once daily.  Stage 3b chronic kidney disease (CMS/HCC)  Assessment & Plan:  Stable. Avoid nephrotoxic medications.  Adjustment disorder with depressed mood  Assessment & Plan:  Stable. Continue Lexapro.   Orders:  -     escitalopram (Lexapro) 20 mg tablet; Take 1 tablet (20 mg) by mouth once daily.  Type 2 diabetes mellitus with diabetic neuropathy, with long-term current use of insulin (CMS/HCC)  -     pioglitazone (Actos) 30 mg tablet; Take 1 tablet (30 mg) by mouth once daily.  -     metFORMIN (Glucophage) 1,000 mg tablet; Take 1 tablet (1,000 mg) by mouth with breakfast and with evening meal.  -     losartan (Cozaar) 25 mg tablet; Take 1 tablet (25 mg) by mouth in the morning.  Primary hypertension  -     metoprolol tartrate (Lopressor) 100 mg tablet; Take 1 tablet (100 mg) by mouth twice daily.  -     losartan (Cozaar) 25 mg tablet; Take 1 tablet (25 mg) by mouth in the morning.

## 2022-03-19 MED ORDER — losartan (Cozaar) 25 mg tablet
25 | ORAL_TABLET | Freq: Every morning | ORAL | 1 refills | 90.00000 days | Status: DC
Start: 2022-03-19 — End: 2022-03-30

## 2022-03-19 MED ORDER — metFORMIN (Glucophage) 1,000 mg tablet
1000 | ORAL_TABLET | Freq: Two times a day (BID) | ORAL | 3 refills | Status: AC
Start: 2022-03-19 — End: 2023-03-19

## 2022-03-19 MED ORDER — metoprolol tartrate (Lopressor) 100 mg tablet
100 | ORAL_TABLET | Freq: Two times a day (BID) | ORAL | 1 refills | Status: DC
Start: 2022-03-19 — End: 2022-03-30

## 2022-03-19 MED ORDER — escitalopram (Lexapro) 20 mg tablet
20 | ORAL_TABLET | Freq: Every day | ORAL | 1 refills | Status: AC
Start: 2022-03-19 — End: 2022-09-15

## 2022-03-19 MED ORDER — pioglitazone (Actos) 30 mg tablet
30 | ORAL_TABLET | Freq: Every day | ORAL | 3 refills | 90.00000 days | Status: AC
Start: 2022-03-19 — End: 2023-03-14

## 2022-03-19 MED ORDER — omeprazole OTC (PriLOSEC OTC) 20 mg EC tablet
20 | ORAL_TABLET | Freq: Every day | ORAL | 0 refills | 60.00000 days | Status: AC
Start: 2022-03-19 — End: 2022-06-17

## 2022-03-19 NOTE — Telephone Encounter (Signed)
Pls get eye exam report from [facility] done this year

## 2022-03-19 NOTE — Assessment & Plan Note (Signed)
Controlled. Hemoglobin A1C is at goal. Continue current medications. Diabetic diet. Monofilament test normal. Urine microalbumin is also normal. Had eye exam in January per daughter - will get report. Diabetic diet.

## 2022-03-19 NOTE — Assessment & Plan Note (Signed)
Stable. Continue Lexapro.

## 2022-03-19 NOTE — Assessment & Plan Note (Signed)
Stable. Continue Omeprazole. Avoid dietary trigger.

## 2022-03-19 NOTE — Assessment & Plan Note (Signed)
Stable. Avoid nephrotoxic medications.

## 2022-03-19 NOTE — Assessment & Plan Note (Signed)
Controlled. Continue current medication.

## 2022-03-29 MED ORDER — atorvastatin (Lipitor) 40 mg tablet
40 | ORAL_TABLET | Freq: Every evening | ORAL | 0 refills | 90.00000 days | Status: AC
Start: 2022-03-29 — End: ?

## 2022-03-29 NOTE — Telephone Encounter (Signed)
Refilled statin, the rest were sent a week ago

## 2022-04-04 MED ORDER — losartan (Cozaar) 25 mg tablet
25 | ORAL_TABLET | Freq: Every morning | ORAL | 1 refills | 90.00000 days | Status: AC
Start: 2022-04-04 — End: 2022-10-01

## 2022-04-04 MED ORDER — metoprolol tartrate (Lopressor) 100 mg tablet
100 | ORAL_TABLET | Freq: Two times a day (BID) | ORAL | 1 refills | Status: DC
Start: 2022-04-04 — End: 2022-06-27

## 2022-04-24 ENCOUNTER — Inpatient Hospital Stay: Admit: 2022-04-24 | Disposition: A | Discharge status: Home / Self Care | Attending: Family | Primary: Family Medicine

## 2022-04-24 ENCOUNTER — Ambulatory Visit: Admit: 2022-04-24 | Primary: Family Medicine

## 2022-04-24 DIAGNOSIS — J40 Bronchitis, not specified as acute or chronic: Principal | ICD-10-CM

## 2022-04-24 LAB — POCT STREP A PCR: POCT Strep A PCR: NOT DETECTED

## 2022-04-24 LAB — POCT SARS-COV-2/FLU A&B
POCT Flu A: NOT DETECTED
POCT Flu B: NOT DETECTED
POCT SARS-CoV-2: NOT DETECTED

## 2022-04-24 MED ORDER — benzonatate (Tessalon) 100 mg capsule
100 | ORAL_CAPSULE | Freq: Three times a day (TID) | ORAL | 0 refills | 10.00000 days | Status: AC
Start: 2022-04-24 — End: 2022-05-01

## 2022-04-24 NOTE — Discharge Instructions (Signed)
Your COVID flu and strep test today were negative  Your chest x-ray was normal did not show any evidence of acute cardiopulmonary disease or pneumonia.    Did discuss symptomatic management is recommended for viral suspected cause of your symptoms, possible bronchitis.    May continue with Mucinex, just make sure that this is Mucinex without DM if you are going to take the [facility] which we did prescribe for your cough.  Do not combine Mucinex DM with Tessalon Perles as they contain a similar compound called dextromethorphan, make sure you take plenty fluids, and rest.    May continue to use your inhaler as prescribed and as needed.    Please follow-up with your doctor if not much better in the next 2 to 3 days, for any persisting symptoms and as needed return Millie to the urgent care for any new change or worsening symptoms call any questions or concerns

## 2022-04-24 NOTE — ED Provider Notes (Signed)
History  Chief Complaint   Patient presents with   . Cough   . Sore Throat     71 year old female, history of HTN, type 2 diabetes, pulmonary nodule, presents to urgent care accompanied by her daughter with 1 week history of cough, and sore throat, she initially had nasal congestion, rhinorrhea but this is seem to greatly improve.  She reports a history of a lung nodule, from viral illness in past, and is worried that "something is wrong with my lungs"  She has not had any shortness of breath,, but has felt some wheezing at night.  She has not had fevers or chills, nausea, vomiting, fatigue, hemoptysis or productive cough, pleuritic pain or chest pain.  She denies any sinus pain, headache or neck pain, loss of taste or smell no known sick, COVID, flu, strep contacts or travel  No interventions.          Past Medical History:   Diagnosis Date   . Adjustment disorder with depressed mood    . GERD (gastroesophageal reflux disease)    . Hypertension    . Intertrigo    . Knee buckling    . Plantar fasciitis of right foot    . Pulmonary nodule    . Thoracic back pain    . Type 2 diabetes mellitus (CMS/HCC)    . Type 2 diabetes mellitus with diabetic neuropathy, unspecified (CMS/HCC)        Past Surgical History:   Procedure Laterality Date   . ANKLE SURGERY     . ECTOPIC PREGNANCY SURGERY         Family History   Problem Relation Name Age of Onset   . Diabetes Mother     . Other (cirrhoisis of the liver) Mother     . Heart attack Father     . Hypertension Father     . Heart disease Father     . Other (ovarian/uterus CA) Sister     . Hypertension Sister     . Diabetes Sister     . Diabetes Brother     . Hypertension Brother         Social History     Tobacco Use   . Smoking status: Never   . Smokeless tobacco: Never   Vaping Use   . Vaping Use: Never used   Substance Use Topics   . Alcohol use: Never   . Drug use: Never       Review of Systems   Constitutional: Negative for chills, diaphoresis, fatigue and fever.   HENT:  Positive for congestion and sore throat. Negative for postnasal drip, rhinorrhea, sinus pressure and sinus pain.    Respiratory: Positive for cough.    Cardiovascular: Negative.  Negative for chest pain, palpitations and leg swelling.   Gastrointestinal: Negative.    Neurological: Negative.    All other systems reviewed and are negative.      Physical Exam  Vitals:    04/24/22 1308   BP: 136/76   Pulse: 53   Resp: 20   Temp: 36.3 C (97.3 F)   SpO2: 100%   Weight: 77.1 kg   Height: 1.473 m       Physical Exam  Vitals and nursing note reviewed.   Constitutional:       General: She is not in acute distress.     Appearance: She is well-developed. She is not ill-appearing.   HENT:      Right Ear: Tympanic membrane and  ear canal normal.      Left Ear: Tympanic membrane and ear canal normal.      Nose:      Comments: Faint nasal congestion no active rhinorrhea no sinus tenderness to percussion     Mouth/Throat:      Mouth: Mucous membranes are moist.      Pharynx: Oropharynx is clear. Uvula midline. No pharyngeal swelling or posterior oropharyngeal erythema.      Tonsils: No tonsillar exudate or tonsillar abscesses. 0 on the right.   Eyes:      Conjunctiva/sclera: Conjunctivae normal.      Pupils: Pupils are equal, round, and reactive to light.   Neck:      Thyroid: No thyromegaly.   Cardiovascular:      Rate and Rhythm: Normal rate and regular rhythm.      Heart sounds: Normal heart sounds.   Pulmonary:      Effort: Pulmonary effort is normal. No respiratory distress.      Breath sounds: Normal breath sounds. No wheezing, rhonchi or rales.   Chest:      Chest wall: No tenderness.   Abdominal:      General: Bowel sounds are normal.      Palpations: Abdomen is soft.   Musculoskeletal:      Cervical back: Normal range of motion and neck supple.   Lymphadenopathy:      Cervical: No cervical adenopathy.   Skin:     General: Skin is warm and dry.   Neurological:      Mental Status: She is alert.              XR CHEST 2 VIEWS    Final Result   No acute pulmonary abnormality.      Alexi Otrakji 04/24/2022 2:05 PM        Labs Reviewed   POCT STREP A PCR - Normal       Result Value    POCT Strep A PCR Not Detected     POCT SARS-COV-2/FLU A&B - Normal    POCT SARS-CoV-2 Not Detected      POCT Flu A Not Detected      POCT Flu B Not Detected      Narrative:     This test was performed with the [facility] SARS-COV-2 & Influenza A/B RNA by RT PCR molecular test. Not detected results do not rule out infection with COVID-19, Influenza A and/or Influenza B. This test is offered under Emergency use Authorization by the FDA.       Procedures  Procedures    UC Course  Diagnoses as of 04/24/22 1432   Acute cough   Bronchitis       Medical Decision Making  Patient well-appearing, no acute distress afebrile and vital signs are stable.  Are clear, lungs clear, O2 100%, ears clear, no nuchal rigidity or rash  Pleuritic pain, chest pain, recent travel, immobilization, PERC, Wells criteria 0.  Noted, flu, strep negative, CXR -normal.  No acute findings.    Exam are most consistent with resolving URI, and possible bronchitis, will treat with Jerilynn Som, patient declines need of inhaler has an inhaler at home and she has found this helpful she will continue on Mucinex, will increase fluids, she understands not to combine Tessalon with DM Mucinex, as this would contain similar components of dextromethorphan and this was carefully discussed.  Patient agreement treatment plan    Acute cough: complicated acute illness or injury  Bronchitis: complicated acute  illness or injury  Amount and/or Complexity of Data Reviewed  Radiology: ordered.      Risk  Prescription drug management.          Discharge Meds  ED Prescriptions     Medication Sig Dispense Start Date End Date Auth. Provider    benzonatate (Tessalon) 100 mg capsule Take 1 capsule (100 mg) by mouth every 8 (eight) hours for 7 days. Do not crush or chew. 21 capsule 04/24/2022 05/01/2022 Mickey Poinsett, NP           Home Meds  Prior to Admission medications    Medication Sig Start Date End Date Taking? Authorizing Provider   aspirin 81 mg EC tablet Take 1 tablet by mouth in the morning.   Yes Historical Provider, MD   atorvastatin (Lipitor) 40 mg tablet Take 1 tablet (40 mg) by mouth at bedtime. 03/29/22  Yes Floydene Flock, MD   cholecalciferol (Vitamin D-3) 25 MCG (1000 UT) capsule Take 1 capsule by mouth in the morning.   Yes Historical Provider, MD   escitalopram (Lexapro) 20 mg tablet Take 1 tablet (20 mg) by mouth once daily. 03/19/22 09/15/22 Yes Emelyn Molato, MD   fluticasone (Flonase) 50 mcg/actuation nasal spray Administer 2 sprays into each nostril in the morning. Shake gently. Before first use, prime pump. After use, clean tip and replace cap. 08/08/21 08/08/22 Yes Mirna Mires, FNP   gabapentin (Neurontin) 300 mg capsule Take 1 capsule (300 mg) by mouth in the morning and at bedtime. 06/27/21 06/22/22 Yes Mirna Mires, FNP   losartan (Cozaar) 25 mg tablet Take 1 tablet (25 mg) by mouth in the morning. 04/04/22 10/01/22 Yes Emelyn Molato, MD   metFORMIN (Glucophage) 1,000 mg tablet Take 1 tablet (1,000 mg) by mouth with breakfast and with evening meal. 03/19/22 03/19/23 Yes Emelyn Molato, MD   metoprolol tartrate (Lopressor) 100 mg tablet Take 1 tablet (100 mg) by mouth twice daily. 04/04/22 10/01/22 Yes Emelyn Molato, MD   multivit with minerals/lutein (MULTIVITAMIN 50 PLUS ORAL) Take 1 tablet by mouth in the morning. 12/29/12  Yes Historical Provider, MD   omeprazole OTC (PriLOSEC OTC) 20 mg EC tablet Take 1 tablet (20 mg) by mouth once daily. 03/19/22 06/17/22 Yes Emelyn Molato, MD   pioglitazone (Actos) 30 mg tablet Take 1 tablet (30 mg) by mouth once daily. 03/19/22 03/14/23 Yes Emelyn Molato, MD         Total amount of time spent on day of service doing chart review, history and physical exam, order/ review results of testing ordered (if any), patient counseling, documentation: 35 minutes.      Patient encounter note  may have been created using voice recognition software and in real time during the office visit. Please excuse any typographical errors that may not have been edited out.            Mickey Lyons, NP  04/24/22 1432

## 2022-04-26 ENCOUNTER — Encounter: Attending: Family | Primary: Family Medicine

## 2022-05-02 MED ORDER — albuterol 90 mcg/actuation inhaler
90 | Freq: Four times a day (QID) | RESPIRATORY_TRACT | 0 refills | Status: AC | PRN
Start: 2022-05-02 — End: ?

## 2022-05-02 NOTE — Telephone Encounter (Signed)
Misty StanleyLisa called asking for a refill on he albuterol inhaler,its been awhile since she last filled

## 2022-05-10 ENCOUNTER — Inpatient Hospital Stay
Admit: 2022-05-10 | Disposition: A | Discharge status: Home / Self Care | Attending: Nurse Practitioner | Primary: Family Medicine

## 2022-05-10 ENCOUNTER — Ambulatory Visit: Admit: 2022-05-10 | Primary: Family Medicine

## 2022-05-10 DIAGNOSIS — M7532 Calcific tendinitis of left shoulder: Secondary | ICD-10-CM

## 2022-05-10 NOTE — Discharge Instructions (Addendum)
Tylenol 650 mg every 4 hours as needed for pain  Alternate with  400 mg of ibuprofen every 8 hours as needed for pain, take with food.  Any over-the-counter topical pain relief of your choice use as directed.  Ice  Gentle stretching  Contact orthopedist and make an appointment for next available to evaluate left shoulder pain

## 2022-05-10 NOTE — ED Provider Notes (Signed)
History  Chief Complaint   Patient presents with   . Shoulder Pain     Pt daughter states here due to left shoulder pain since Monday. Pt states prior to pain started she had lifted a 30 pack of beer cans.      71 year old female [place] speaking, daughter is present and interpreting.  Patient has a past medical history of type 2 diabetes, thoracic back pain, pulmonary nodule, plantar fasciitis, hypertension, GERD and adjustment disorder.  Patient is here today with left shoulder pain that started about 4 days ago.  Patient states that she lifted a 30 pack of beer and the pain started immediately after.  Patient is indicating that she has pain along the bicep region and extends down to the lower aspect of the upper arm.  There is no distal weakness or distal sensory changes. Patient denies any weakness or diaphoresis..  There is no shortness of breath or chest pain.  Pain is much worse when she lifts her arm        History provided by:  Patient  Language interpreter used: No        Past Medical History:   Diagnosis Date   . Adjustment disorder with depressed mood    . GERD (gastroesophageal reflux disease)    . Hypertension    . Intertrigo    . Knee buckling    . Plantar fasciitis of right foot    . Pulmonary nodule    . Thoracic back pain    . Type 2 diabetes mellitus (CMS/HCC)    . Type 2 diabetes mellitus with diabetic neuropathy, unspecified (CMS/HCC)        Past Surgical History:   Procedure Laterality Date   . ANKLE SURGERY     . ECTOPIC PREGNANCY SURGERY         Family History   Problem Relation Name Age of Onset   . Diabetes Mother     . Other (cirrhoisis of the liver) Mother     . Heart attack Father     . Hypertension Father     . Heart disease Father     . Other (ovarian/uterus CA) Sister     . Hypertension Sister     . Diabetes Sister     . Diabetes Brother     . Hypertension Brother         Social History     Tobacco Use   . Smoking status: Never   . Smokeless tobacco: Never   Vaping Use   . Vaping  Use: Never used   Substance Use Topics   . Alcohol use: Never   . Drug use: Never       Review of Systems   Constitutional: Negative.  Negative for diaphoresis.   Respiratory: Negative for cough and shortness of breath.    Cardiovascular: Negative for chest pain, palpitations and leg swelling.   Gastrointestinal: Negative for nausea and vomiting.   Musculoskeletal: Positive for arthralgias (Left shoulder).   Skin: Negative for rash and wound.   Neurological: Negative for numbness.   Psychiatric/Behavioral: Negative.        Physical Exam  Vitals:    05/10/22 1143 05/10/22 1144   BP:  147/68   BP Location:  Right arm   Patient Position:  Sitting   Pulse:  50   Resp:  16   Temp:  37.1 C (98.7 F)   TempSrc:  Oral   SpO2:  98%   Weight: 77.1 kg  Height: 1.473 m        Physical Exam  Vitals reviewed.   HENT:      Head: Normocephalic.      Mouth/Throat:      Mouth: Mucous membranes are moist.   Eyes:      Conjunctiva/sclera: Conjunctivae normal.      Pupils: Pupils are equal, round, and reactive to light.   Cardiovascular:      Rate and Rhythm: Normal rate and regular rhythm.      Pulses: Normal pulses.      Heart sounds: Normal heart sounds.   Pulmonary:      Effort: Pulmonary effort is normal.      Breath sounds: Normal breath sounds.   Musculoskeletal:      Cervical back: Normal range of motion.      Comments: Left shoulder  There is no deformity, redness or swelling.  There is tenderness to palp along the bicep region  Range of motion is limited second to pain  Patient is unable to fully extend  Normal sensory down to the fingertips of the left hand  Strength is 4 on 5 this is second to pain  Radial pulse +2   Skin:     General: Skin is warm and dry.   Neurological:      General: No focal deficit present.      Mental Status: She is alert and oriented to person, place, and time.   Psychiatric:         Mood and Affect: Mood normal.         Behavior: Behavior normal.              XR SHOULDER LEFT 2+ VIEWS   Final  Result   Distal posterior rotator cuff calcific tendinitis. No fractures. Arthritic changes in the spine as described.      Roderic Palau, MD 05/10/2022 12:20 PM        Labs Reviewed - No data to display    Procedures  Procedures    UC Course  Diagnoses as of 05/10/22 1226   Acute pain of left shoulder   Calcific tendinitis of left shoulder region       Medical Decision Making  HPI  71 year old female [place] speaking, daughter is present and interpreting.  Patient has a past medical history of type 2 diabetes, thoracic back pain, pulmonary nodule, plantar fasciitis, hypertension, GERD and adjustment disorder.  Patient is here today with left shoulder pain that started about 4 days ago.  Patient states that she lifted a 30 pack of beer and the pain started immediately after.  Patient is indicating that she has pain along the bicep region and extends down to the lower aspect of the upper arm.  There is no distal weakness or distal sensory changes.  Patient denies any weakness or diaphoresis..  There is no shortness of breath or chest pain.  Pain is much worse when she lifts her arm    Vital signs reviewed and unremarkable    Exam  Left shoulder  There is no deformity, redness or swelling.  There is tenderness to palp along the bicep region  Range of motion is limited second to pain  Patient is unable to fully extend  Normal sensory down to the fingertips of the left hand  Strength is 4 on 5 this is second to pain  Radial pulse +2    Plan  X-ray left shoulder    1220  Distal posterior  rotor cuff calcific tendinitis.  No fractures arthritic changes in the spine  Will refer patient to orthopedics  Advise ice, Tylenol alternating with ibuprofen      Acute pain of left shoulder: complicated acute illness or injury  Amount and/or Complexity of Data Reviewed  Radiology: ordered.          Discharge Meds  ED Prescriptions    None         Home Meds  Prior to Admission medications    Medication Sig Start Date  End Date Taking? Authorizing Provider   albuterol 90 mcg/actuation inhaler Inhale 2 puffs every 6 (six) hours if needed for wheezing or shortness of breath. 05/02/22   Emelyn Molato, MD   aspirin 81 mg EC tablet Take 1 tablet by mouth in the morning.    Historical Provider, MD   atorvastatin (Lipitor) 40 mg tablet Take 1 tablet (40 mg) by mouth at bedtime. 03/29/22   Floydene Flock, MD   cholecalciferol (Vitamin D-3) 25 MCG (1000 UT) capsule Take 1 capsule by mouth in the morning.    Historical Provider, MD   escitalopram (Lexapro) 20 mg tablet Take 1 tablet (20 mg) by mouth once daily. 03/19/22 09/15/22  Emelyn Molato, MD   fluticasone (Flonase) 50 mcg/actuation nasal spray Administer 2 sprays into each nostril in the morning. Shake gently. Before first use, prime pump. After use, clean tip and replace cap. 08/08/21 08/08/22  Mirna Mires, FNP   gabapentin (Neurontin) 300 mg capsule Take 1 capsule (300 mg) by mouth in the morning and at bedtime. 06/27/21 06/22/22  Mirna Mires, FNP   losartan (Cozaar) 25 mg tablet Take 1 tablet (25 mg) by mouth in the morning. 04/04/22 10/01/22  Emelyn Molato, MD   metFORMIN (Glucophage) 1,000 mg tablet Take 1 tablet (1,000 mg) by mouth with breakfast and with evening meal. 03/19/22 03/19/23  Emelyn Molato, MD   metoprolol tartrate (Lopressor) 100 mg tablet Take 1 tablet (100 mg) by mouth twice daily. 04/04/22 10/01/22  Peggye Form, MD   multivit with minerals/lutein (MULTIVITAMIN 50 PLUS ORAL) Take 1 tablet by mouth in the morning. 12/29/12   Historical Provider, MD   omeprazole OTC (PriLOSEC OTC) 20 mg EC tablet Take 1 tablet (20 mg) by mouth once daily. 03/19/22 06/17/22  Emelyn Molato, MD   pioglitazone (Actos) 30 mg tablet Take 1 tablet (30 mg) by mouth once daily. 03/19/22 03/14/23  Emelyn Molato, MD         Total amount of time spent on day of service doing chart review, history and physical exam, order/ review results of testing ordered (if any), patient counseling, documentation: 35  minutes.      Patient encounter note may have been created using voice recognition software and in real time during the office visit. Please excuse any typographical errors that may not have been edited out.            Paschal Dopp, NP  05/10/22 1226

## 2022-06-28 MED ORDER — metoprolol tartrate (Lopressor) 100 mg tablet
100 | ORAL_TABLET | Freq: Two times a day (BID) | ORAL | 1 refills | Status: AC
Start: 2022-06-28 — End: 2022-12-25

## 2022-06-28 MED ORDER — atorvastatin (Lipitor) 40 mg tablet
40 | ORAL_TABLET | Freq: Every evening | ORAL | 0 refills | 90.00000 days | Status: AC
Start: 2022-06-28 — End: ?

## 2022-06-28 MED ORDER — escitalopram (Lexapro) 20 mg tablet
20 | ORAL_TABLET | Freq: Every day | ORAL | 1 refills | Status: AC
Start: 2022-06-28 — End: 2022-12-25

## 2022-06-28 NOTE — Telephone Encounter (Signed)
Annual Exam 09/14/21   Last labs 03/15/22

## 2022-08-01 MED ORDER — gabapentin (Neurontin) 300 mg capsule
300 | ORAL_CAPSULE | Freq: Two times a day (BID) | ORAL | 0 refills | Status: AC
Start: 2022-08-01 — End: 2022-10-30

## 2022-08-01 NOTE — Telephone Encounter (Signed)
gabapentin (Neurontin) 300 mg capsule    Last OV:03/19/22   Last Filled: 06/27/21 QTY 180 with 3 refills

## 2022-09-28 MED ORDER — atorvastatin (Lipitor) 40 mg tablet
40 | ORAL_TABLET | Freq: Every evening | ORAL | 0 refills | 90.00000 days | Status: DC
Start: 2022-09-28 — End: 2022-12-26

## 2022-09-28 MED ORDER — losartan (Cozaar) 25 mg tablet
25 | ORAL_TABLET | Freq: Every morning | ORAL | 1 refills | 90.00000 days | Status: AC
Start: 2022-09-28 — End: 2023-03-27

## 2022-09-28 NOTE — Telephone Encounter (Signed)
Atorvastatin last filled 06/28/22  Losartan last filled 04/04/22    Last seen for OV with EM 03/15/22  Pt due for CPE, advised pt to book appt

## 2022-11-28 MED ORDER — metFORMIN (Glucophage) 1,000 mg tablet
1000 | ORAL_TABLET | Freq: Two times a day (BID) | ORAL | 3 refills | Status: AC
Start: 2022-11-28 — End: 2023-11-28

## 2022-11-28 NOTE — Telephone Encounter (Addendum)
Last filled 08/28/2022  Last seen OV 03/15/22  Upcoming CPE 12/03/22

## 2022-12-03 ENCOUNTER — Ambulatory Visit: Admit: 2022-12-03 | Discharge: 2022-12-03 | Attending: Family | Primary: Family Medicine

## 2022-12-03 DIAGNOSIS — Z0000 Encounter for general adult medical examination without abnormal findings: Secondary | ICD-10-CM

## 2022-12-03 MED ORDER — meclizine (Antivert) 12.5 mg tablet
12.5 | ORAL_TABLET | Freq: Three times a day (TID) | ORAL | 0 refills | Status: DC | PRN
Start: 2022-12-03 — End: 2022-12-20

## 2022-12-03 MED ORDER — gabapentin (Neurontin) 300 mg capsule
300 | ORAL_CAPSULE | Freq: Two times a day (BID) | ORAL | 0 refills | Status: AC
Start: 2022-12-03 — End: 2023-03-03

## 2022-12-03 MED ORDER — clotrimazole (Lotrimin) 1 % cream
1 | Freq: Two times a day (BID) | TOPICAL | 2 refills | 7.00000 days | Status: AC | PRN
Start: 2022-12-03 — End: 2023-01-02

## 2022-12-03 MED ORDER — nystatin (Mycostatin) 100,000 unit/gram powder
100000 | Freq: Two times a day (BID) | TOPICAL | 1 refills | 24.00000 days | Status: AC
Start: 2022-12-03 — End: ?
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
## 2022-12-11 ENCOUNTER — Inpatient Hospital Stay: Admit: 2022-12-11 | Primary: Family Medicine

## 2022-12-11 DIAGNOSIS — E1140 Type 2 diabetes mellitus with diabetic neuropathy, unspecified: Secondary | ICD-10-CM

## 2022-12-12 LAB — URINALYSIS
Bilirubin Ur: NEGATIVE
Glucose Ur: NEGATIVE
Ketones Ur: NEGATIVE
Nitrite Ur: NEGATIVE
Occult Blood Ur: NEGATIVE
Specific Gravity Ur: 1.016 (ref 1.005–1.030)
Urobilinogen,Semi-Qn Ur: 0.2 mg/dL (ref 0.2–1.0)
WBC Esterase Ur: NEGATIVE
pH Ur: 8.5 — ABNORMAL HIGH (ref 5.0–7.5)

## 2022-12-12 LAB — HEMOGLOBIN A1C: HgbA1C: 7.1 % — ABNORMAL HIGH (ref 4.8–5.6)

## 2022-12-12 LAB — COMPREHENSIVE METABOLIC PANEL
A/G Ratio: 2 (ref 1.2–2.2)
ALT: 14 IU/L (ref 0–32)
AST: 14 IU/L (ref 0–40)
Albumin: 4.2 g/dL (ref 3.9–4.9)
Alk Phosphatase: 92 IU/L (ref 44–121)
Anion Gap: 14 mmol/L (ref 10.0–18.0)
BUN/Creat Ratio: 14 (ref 12–28)
BUN: 17 mg/dL (ref 8–27)
Bili Total: 0.3 mg/dL (ref 0.0–1.2)
Calcium: 9.7 mg/dL (ref 8.7–10.3)
Carbon Dioxide: 23 mmol/L (ref 20–29)
Chloride: 103 mmol/L (ref 96–106)
Creat: 1.22 mg/dL — ABNORMAL HIGH (ref 0.57–1.00)
Globulin Total: 2.1 g/dL (ref 1.5–4.5)
Glucose: 110 mg/dL — ABNORMAL HIGH (ref 70–99)
Potassium: 5.9 mmol/L — ABNORMAL HIGH (ref 3.5–5.2)
Protein Total: 6.3 g/dL (ref 6.0–8.5)
Sodium: 140 mmol/L (ref 134–144)
eGFR: 48 mL/min/{1.73_m2} — ABNORMAL LOW (ref >59)

## 2022-12-12 LAB — CBC W/DIFF
Baso Abs: 0.1 10*3/uL (ref 0.0–0.2)
Basos: 1 %
Eos Abs: 0.5 10*3/uL — ABNORMAL HIGH (ref 0.0–0.4)
Eos: 4 %
Hct: 33.2 % — ABNORMAL LOW (ref 34.0–46.6)
Hgb: 10 g/dL — ABNORMAL LOW (ref 11.1–15.9)
Immature Grans Abs: 0.1 10*3/uL (ref 0.0–0.1)
Immature Granulocytes: 1 %
Lymphs Abs: 2.6 10*3/uL (ref 0.7–3.1)
Lymphs: 23 %
MCH: 24.1 pg — ABNORMAL LOW (ref 26.6–33.0)
MCHC: 30.1 g/dL — ABNORMAL LOW (ref 31.5–35.7)
MCV: 80 fL (ref 79–97)
Monocytes: 8 %
MonocytesAbs: 0.9 10*3/uL (ref 0.1–0.9)
Neutrophils Abs: 6.9 10*3/uL (ref 1.4–7.0)
Neutrophils: 63 %
Platelets: 254 10*3/uL (ref 150–450)
RBC: 4.15 x10E6/uL (ref 3.77–5.28)
RDW: 14.4 % (ref 11.7–15.4)
WBC: 10.9 10*3/uL — ABNORMAL HIGH (ref 3.4–10.8)

## 2022-12-12 LAB — ALBUMIN, URINE, RANDOM (INCLUDING CREATININE)
Alb/Creat Ratio: 19 mg/g{creat} (ref 0–29)
Albumin Ur: 17.8 ug/mL
Creat Ur: 94.8 mg/dL

## 2022-12-12 LAB — LIPID PANEL
Cholesterol: 151 mg/dL (ref 100–199)
HDL Cholesterol: 50 mg/dL (ref >39)
LDLc Calc (NIH): 70 mg/dL (ref 0–99)
Non-HDL Chol: 101 mg/dL (ref 0–129)
Triglycerides: 189 mg/dL — ABNORMAL HIGH (ref 0–149)
VLDLc Calc: 31 mg/dL (ref 5–40)

## 2022-12-12 LAB — VITAMIN B12: Vitamin B12: 282 pg/mL (ref 232–1245)

## 2022-12-13 LAB — REFLEXIVE URINE CULTURE

## 2022-12-13 LAB — URINE CULTURE

## 2022-12-14 MED ORDER — pioglitazone (Actos) 30 mg tablet
30 | ORAL_TABLET | Freq: Every day | ORAL | 1 refills | 90.00000 days | Status: AC
Start: 2022-12-14 — End: 2023-12-09

## 2022-12-14 NOTE — Telephone Encounter (Signed)
Last filled 09/12/2022  Last seen 12/03/22 CPE

## 2022-12-18 LAB — THYROID PEROXIDASE (TPO) AB (REFLEX ONLY): Thyroid peroxidase antibody: <9 [IU]/mL (ref 0–34)

## 2022-12-18 LAB — THYROXINE (T4) FREE, DIRECT (REFLEX ONLY): T4, Free: 0.85 ng/dL (ref 0.82–1.77)

## 2022-12-18 LAB — TSH WITH REFLEX: TSH: 4.61 u[IU]/mL — ABNORMAL HIGH (ref 0.450–4.500)

## 2022-12-20 NOTE — Telephone Encounter (Signed)
Open under pt advice.

## 2022-12-20 NOTE — Telephone Encounter (Signed)
Meclizine too sedating, still would like to review rx options for sleep.

## 2022-12-26 NOTE — Telephone Encounter (Signed)
Atorvastatin Calcium 40 MG Oral Tablet      Last Filled: 09/28/22 qty 90     Last CPE: 12/03/22 with RA

## 2022-12-28 NOTE — Telephone Encounter (Signed)
Refused filled 12/22/2022

## 2023-01-02 MED ORDER — escitalopram (Lexapro) 20 mg tablet
20 | ORAL_TABLET | Freq: Every day | ORAL | 1 refills | Status: AC
Start: 2023-01-02 — End: 2023-07-01

## 2023-01-02 NOTE — Telephone Encounter (Signed)
escitalopram (Lexapro) 20 mg tablet    Last Filled: 06/28/22 qty 90 with 1 refill   Last Cpe: 12/03/22 with RA

## 2023-01-02 NOTE — Telephone Encounter (Signed)
LF 06/28/22    Last OV

## 2023-01-24 ENCOUNTER — Ambulatory Visit: Primary: Family Medicine

## 2023-01-24 ENCOUNTER — Ambulatory Visit: Admit: 2023-01-24 | Discharge: 2023-01-24 | Attending: Family Medicine | Primary: Family Medicine

## 2023-01-24 DIAGNOSIS — J069 Acute upper respiratory infection, unspecified: Secondary | ICD-10-CM

## 2023-01-24 NOTE — Progress Notes (Signed)
[facility]  [address]  [address]  [place] [address]  Dept: [phone]  Dept Fax: [phone]     Patient ID: [name] is a 72 y.o. female who presents for Cough (Since sunday, coughing up yellowish mucus), Sore Throat (Since sunday), Earache (L ear pain since sunday), and Nasal Congestion.    Subjective   72 year old female presents with cough and sore throat that began 5 days ago. The cough is non productive.  Associated with stuffy nose , post nasal drip and left ear pain. Taking Cough drops and Tylenol  No fever and chills. Has body aches. No fatigue.  Denies nausea and vomiting.      Review of Systems   Constitutional: Negative.    HENT: Positive for congestion, ear pain and sore throat.    Respiratory: Positive for cough. Negative for sputum production, shortness of breath and wheezing.    Cardiovascular: Negative.    Gastrointestinal: Negative.      Patient Active Problem List   Diagnosis   . Adjustment disorder with depressed mood   . Benign paroxysmal positional vertigo   . Closed bimalleolar fracture   . Closed fracture of talus   . Dysfunction of both eustachian tubes   . GERD (gastroesophageal reflux disease)   . Hypercholesterolemia   . Intertrigo   . Essential hypertension   . Knee buckling   . Obesity (BMI 35.0-39.9 without comorbidity)   . Plantar fasciitis of right foot   . Pulmonary nodule   . Thoracic back pain   . Type 2 diabetes mellitus (CMS-HCC) (HHS-HCC)   . Type 2 diabetes mellitus with diabetic neuropathy, unspecified (CMS-HCC) (HHS-HCC)   . Stage 3b chronic kidney disease (CMS-HCC) (HHS-HCC)   . Anemia   . Screening for colon cancer   . Need for influenza vaccination   . Routine adult health maintenance   . Hypothyroidism   . Screening for osteoporosis   . Abnormal CBC   . Medication monitoring encounter   . Vertigo   . Pulsatile tinnitus   . Viral URI with cough     Current Outpatient Medications   Medication Instructions   . albuterol 90  mcg/actuation inhaler 2 puffs, inhalation, Every 6 hours PRN   . aspirin 81 mg EC tablet 1 tablet, oral, Daily   . atorvastatin (LIPITOR) 40 mg, oral, Nightly   . cholecalciferol (Vitamin D-3) 25 MCG (1000 UT) capsule 1 capsule, oral, Daily   . escitalopram (LEXAPRO) 20 mg, oral, Daily   . fluticasone (Flonase) 50 mcg/actuation nasal spray 2 sprays, Each Nostril, Daily, Shake gently. Before first use, prime pump. After use, clean tip and replace cap.   . gabapentin (NEURONTIN) 300 mg, oral, 2 times daily   . losartan (COZAAR) 25 mg, oral, Every morning   . metFORMIN (GLUCOPHAGE) 1,000 mg, oral, 2 times daily with meals   . metoprolol tartrate (LOPRESSOR) 100 mg, oral, 2 times daily   . multivit with minerals/lutein (MULTIVITAMIN 50 PLUS ORAL) 1 tablet, oral, Daily RT   . nystatin (Mycostatin) 100,000 unit/gram powder Topical, 2 times daily   . omeprazole OTC (PRILOSEC OTC) 20 mg, oral, Daily   . pioglitazone (ACTOS) 30 mg, oral, Daily     Allergies   Allergen Reactions   . Citalopram Hallucinations   . Glimepiride Dizziness   . Lisinopril Cough     Social History     Tobacco Use   . Smoking status: Never   .  Smokeless tobacco: Never   Vaping Use   . Vaping Use: Never used   Substance Use Topics   . Alcohol use: Never   . Drug use: Never     Objective   Visit Vitals  BP 128/80   Pulse 58   Temp 36.7 C (98.1 F)   Ht 1.702 m   Wt 77.6 kg Comment: 171 lb   SpO2 98%   BMI 26.78 kg/m   OB Status Postmenopausal   BSA 1.92 m     Physical Exam  Vitals reviewed.   Constitutional:       Appearance: Normal appearance.   HENT:      Right Ear: Tympanic membrane, ear canal and external ear normal.      Left Ear: Tympanic membrane, ear canal and external ear normal.      Nose: Congestion present.      Mouth/Throat:      Comments: No pharayngeal erythema  Cardiovascular:      Rate and Rhythm: Normal rate and regular rhythm.      Heart sounds: Normal heart sounds. No murmur heard.  Pulmonary:      Effort: Pulmonary effort is  normal.      Breath sounds: Normal breath sounds. No wheezing, rhonchi or rales.   Neurological:      Mental Status: She is alert.       Assessment/Plan   [name] was seen today for cough, sore throat, earache and nasal congestion.  Viral URI with cough  Assessment & Plan:  I suspect viral in nature. Self limiting. Supportive care. Plenty of fluids. Hot tea with lemon and honey. Corricidan HBP for cough. Antibiotic not indicated at this time.  If not better by next week or get worse call the office will consider antibiotic at that time.

## 2023-01-25 DIAGNOSIS — J069 Acute upper respiratory infection, unspecified: Secondary | ICD-10-CM

## 2023-01-25 NOTE — Assessment & Plan Note (Signed)
I suspect viral in nature. Self limiting. Supportive care. Plenty of fluids. Hot tea with lemon and honey. Corricidan HBP for cough. Antibiotic not indicated at this time.  If not better by next week or get worse call the office will consider antibiotic at that time.

## 2023-01-29 MED ORDER — amoxicillin-pot clavulanate (Augmentin) 875-125 mg tablet
875-125 | ORAL_TABLET | Freq: Two times a day (BID) | ORAL | 0 refills | 7.00000 days | Status: AC
Start: 2023-01-29 — End: 2023-02-08

## 2023-01-29 NOTE — Addendum Note (Signed)
Addended by: Peggye Form on: 01/29/2023 05:01 PM     Modules accepted: Orders

## 2023-01-29 NOTE — Telephone Encounter (Signed)
Rx sent for Augmentin

## 2023-01-29 NOTE — Patient Instructions (Signed)
Mail box is full.   Sent portal reply message.

## 2023-01-30 NOTE — Telephone Encounter (Signed)
Spoke with patient, updated to Augmentin order.   LVM for "[name]" to call back.

## 2023-01-30 NOTE — Telephone Encounter (Signed)
[name] lvm states she picked up the antibiotic.

## 2023-02-13 ENCOUNTER — Inpatient Hospital Stay: Admit: 2023-02-13 | Primary: Family Medicine

## 2023-02-13 DIAGNOSIS — Z1231 Encounter for screening mammogram for malignant neoplasm of breast: Secondary | ICD-10-CM

## 2023-02-26 ENCOUNTER — Encounter: Payer: PRIVATE HEALTH INSURANCE | Attending: Otolaryngology | Primary: Family Medicine

## 2023-03-22 MED ORDER — gabapentin (Neurontin) 300 mg capsule
300 | ORAL_CAPSULE | Freq: Two times a day (BID) | ORAL | 0 refills | Status: AC
Start: 2023-03-22 — End: 2023-06-20

## 2023-03-22 NOTE — Telephone Encounter (Signed)
Last CPE 12/03/22  Next appts 06/13/23 & 12/05/23

## 2023-03-22 NOTE — Telephone Encounter (Signed)
Last filled 12/03/22, last seen 01/24/23

## 2023-03-28 NOTE — Telephone Encounter (Signed)
Filled 12/26/2022   Cpe 12/03/22

## 2023-04-01 ENCOUNTER — Ambulatory Visit
Admit: 2023-04-01 | Discharge: 2023-04-01 | Payer: PRIVATE HEALTH INSURANCE | Attending: Otolaryngology | Primary: Family Medicine

## 2023-04-01 DIAGNOSIS — H9313 Tinnitus, bilateral: Secondary | ICD-10-CM

## 2023-04-01 MED ORDER — atorvastatin (Lipitor) 40 mg tablet
40 | ORAL_TABLET | Freq: Every evening | ORAL | 0 refills | 90.00000 days | Status: AC
Start: 2023-04-01 — End: ?

## 2023-04-01 NOTE — Telephone Encounter (Signed)
Filled 12/27/2022   Cpe 12/03/22

## 2023-04-01 NOTE — Progress Notes (Signed)
[facility]  [address]      [place], [place] [address]      [phone]    CHIEF COMPLAINT:   Chief Complaint   Patient presents with    Hearing Loss      HISTORY:  [name] is a 72 y.o. female who presents for evaluation of dizziness.  She is here today with her daughter.  Patient notes that she developed the dizziness a few months ago.  Fortunately it is better now.  She notes that when the dizziness was present, it would last for minutes to an hour at a time. Not associated with movement.  +Non pulsatile tinnitus bilaterally. No aural fullness/pressure. No change in hearing. No otorrhea, otalgia, or ear infections. 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  Has a history of BPPV in 2020.     Allergies   Allergen Reactions    Citalopram Hallucinations    Glimepiride Dizziness    Lisinopril Cough     Past Medical History:   Diagnosis Date    Adjustment disorder with depressed mood     Diabetes mellitus (CMS-HCC) (HHS-HCC)     GERD (gastroesophageal reflux disease)     Hypertension     Intertrigo     Knee buckling     Plantar fasciitis of right foot     Pulmonary nodule     Thoracic back pain     Type 2 diabetes mellitus (CMS-HCC) (HHS-HCC)     Type 2 diabetes mellitus with diabetic neuropathy, unspecified (CMS-HCC) (HHS-HCC)      Past Surgical History:   Procedure Laterality Date    ANKLE SURGERY      ECTOPIC PREGNANCY SURGERY       Current Outpatient Medications   Medication Sig Dispense Refill    albuterol 90 mcg/actuation inhaler Inhale 2 puffs every 6 (six) hours if needed for wheezing or shortness of breath. 18 g 0    aspirin 81 mg EC tablet Take 1 tablet by mouth in the morning.      atorvastatin (Lipitor) 40 mg tablet Take 1 tablet (40 mg) by mouth at bedtime. 90 tablet 0    cholecalciferol (Vitamin D-3) 25 MCG (1000 UT) capsule Take 1 capsule by mouth in the morning.      escitalopram (Lexapro) 20 mg tablet Take 1 tablet (20 mg) by  mouth once daily. 90 tablet 1    fluticasone (Flonase) 50 mcg/actuation nasal spray Administer 2 sprays into each nostril in the morning. Shake gently. Before first use, prime pump. After use, clean tip and replace cap. 16 g 5    gabapentin (Neurontin) 300 mg capsule Take 1 capsule (300 mg) by mouth twice daily. 180 capsule 0    losartan (Cozaar) 25 mg tablet TAKE 1 TABLET BY MOUTH IN THE MORNING 90 tablet 0    metFORMIN (Glucophage) 1,000 mg tablet Take 1 tablet (1,000 mg) by mouth with breakfast and with evening meal. 180 tablet 3    metoprolol tartrate (Lopressor) 100 mg tablet Take 1 tablet (100 mg) by mouth twice daily. 180 tablet 1    multivit with minerals/lutein (MULTIVITAMIN 50 PLUS ORAL) Take 1 tablet by mouth in the morning.      nystatin (Mycostatin) 100,000 unit/gram powder Apply topically twice daily. (Patient not taking: Reported on 04/01/2023) 60 g 1    omeprazole OTC (PriLOSEC OTC) 20 mg EC tablet Take 1 tablet (20 mg) by mouth once daily.  90 tablet 0    pioglitazone (Actos) 30 mg tablet Take 1 tablet (30 mg) by mouth once daily. 90 tablet 1     No current facility-administered medications for this visit.     Family History   Problem Relation Name Age of Onset    Diabetes Mother      Other (cirrhoisis of the liver) Mother      Heart attack Father      Hypertension Father      Heart disease Father      Other (ovarian/uterus CA) Sister      Hypertension Sister      Diabetes Sister      Lung cancer Sister      Breast cancer Sister      Diabetes Brother      Hypertension Brother       Social History     Socioeconomic History    Marital status: Married     Spouse name: Not on file    Number of children: Not on file    Years of education: Not on file    Highest education level: Not on file   Occupational History    Not on file   Tobacco Use    Smoking status: Never    Smokeless tobacco: Never   Vaping Use    Vaping Use: Never used   Substance and Sexual Activity    Alcohol use: Never    Drug use: Never     Sexual activity: Defer   Other Topics Concern    Not on file   Social History Narrative    Social History: Sexual History  Had sex in the past 12 months (vaginal, oral, or anal)? No, Have you ever had an STD? No. Alcohol Smart Form  Did you have a drink containing alcohol in the past year? No, Points 0, Interpretation Negative. Tobacco Use  Status: never smoker, Patient counseled on the dangers of tobacco use: 09/13/2020. Seat belt: yes. no Alcohol. no Drug use. Diet/Nutrition: Three meals a day, Dairy, Fruits & Vegetables, Protein Foods. no Exercise. Marital Status: married, husband - Antonio. no Domestic violence. Children: two Daughters. Number of household members: 6, self, husband, daughter, son in law, grand daughter, grandson. Occupation: retired. no Travel outside [place]. Caffeine: coffee in the am. Home smoke detector use: yes. Pets: 2 cat, 3 dogs (chihuahuas). Sexual orientation: heterosexual. Sexually active: not currently.      Social Determinants of Health     Financial Resource Strain: Low Risk  (12/03/2022)    Overall Financial Resource Strain (CARDIA)     Difficulty of Paying Living Expenses: Not hard at all   Food Insecurity: No Food Insecurity (12/03/2022)    Hunger Vital Sign     Worried About Running Out of Food in the Last Year: Never true     Ran Out of Food in the Last Year: Never true   Transportation Needs: No Transportation Needs (12/03/2022)    PRAPARE - Therapist, art (Medical): No     Lack of Transportation (Non-Medical): No   Physical Activity: Inactive (12/03/2022)    Exercise Vital Sign     Days of Exercise per Week: 0 days     Minutes of Exercise per Session: 0 min   Stress: No Stress Concern Present (12/03/2022)    [employer] of Occupational Health - Occupational Stress Questionnaire     Feeling of Stress : Not at all   Social Connections: Unknown (12/03/2022)  Social Connection and Isolation Panel [NHANES]     Frequency of Communication with Friends and Family:  Three times a week     Frequency of Social Gatherings with Friends and Family: Not on file     Attends Religious Services: Never     Active Member of Clubs or Organizations: No     Attends Banker Meetings: Never     Marital Status: Patient declined   Intimate Partner Violence: Not At Risk (12/03/2022)    Humiliation, Afraid, Rape, and Kick questionnaire     Fear of Current or Ex-Partner: No     Emotionally Abused: No     Physically Abused: No     Sexually Abused: No   Housing Stability: Unknown (12/03/2022)    Housing Stability Vital Sign     Unable to Pay for Housing in the Last Year: No     Number of Places Lived in the Last Year: Not on file     Unstable Housing in the Last Year: No       PHYSICAL EXAM:    Constitutional:  General Appearance: No apparent distress, alert.   Orbits:  EOM's intact, no spontaneous nystagmus.   Ear:  Right: Pinna without lesions. External auditory canal is clear without edema or erythema. The tympanic membrane is intact without evidence of middle ear effusion. Left: Pinna without lesions. External auditory canal is clear without edema or erythema. The tympanic membrane is intact without evidence of middle ear effusion. Facial nerve: Facial movement is intact and symmetric.   Nose:  Speculum Exam: Anterior nasal cavity is clear. The caudal septal mucosa is normal. The anterior face of the bilateral inferior turbinates is unremarkable. No obvious purulence or polyps.   Oral cavity/Oropharynx:  Lips: No lesions.  FOM: without lesions.  Tongue: symmetric movement, no masses.  Palate: no masses. Oropharynx: no tonsil mass or lesion, posterior oropharyngeal wall mucosa without lesions. Oral vestibule: no lesions.   Larynx/Hypopharynx:  voice no hoarseness.   Neck:  Thyroid: No palpable nodules. Palpation: No masses, adenopathy.   Skin of Head/Neck:  : No obvious lesions.   Cranial Nerve assessment:  Nerves 3 - 12: symmetric bilaterally.   Vestibular -  wnl    ASSESSMENT/PLAN:  Problem List Items Addressed This Visit          Other    Dizziness    Tinnitus, bilateral - Primary    Relevant Orders    Auditory function tests (Completed)      Audiogram performed today and reviewed with patient. Shows a bilateral sensorineural hearing loss.  Type A tymps. We reviewed the lengthy differential diagnosis of dizziness and vertigo which may include such medical problems such as orthostatic hypotension, hypoglycemia, anxiety attacks, dehydration, visual deficits, neurological problems including migraine variant vertigo, neoplasm. Additionally we reviewed otologic/vestibular sources of vertigo such as vestibular neuronitis, BPPV, PLF, SSCD, and Meniere's disease. After reviewing patient's symptoms and audio, the patient's clinical picture does not seem to correspond to a distinct inner ear-related vertigo condition. We discussed further evaluation of a peripheral cause with vestibular testing, and management with vestibular therapy, and patient would like to hold off on both as the dizziness is much better now. No longer bothersome.  As the differential diagnosis of dizziness and vertigo is quite broad, I also recommended further discussion with patient's primary care physician to assess for other non otologic / non vestibular causes of the patient's symptoms (previously).  Also recommend PCP follow up for carotid findings.  Patient declined further evaluation/management with vestibular testing/therapy at this time - opts for observation and follow up in 6 months with repeat audio. Call earlier if symptoms recur.    Patient feels comfortable with the plan as outlined above. Enouraged to call or return with any questions or concerns. Worrisome symptoms to look out for were reviewed. It was a pleasure meeting with the patient today.

## 2023-04-23 MED ORDER — omeprazole OTC (PriLOSEC OTC) 20 mg EC tablet
20 | ORAL_TABLET | Freq: Every day | ORAL | 0 refills | 60.00000 days | Status: DC
Start: 2023-04-23 — End: 2023-04-26

## 2023-04-23 NOTE — Telephone Encounter (Signed)
LV-01/24/23  CPE-12/03/22  NV-06/13/23

## 2023-04-26 MED ORDER — omeprazole (PriLOSEC) 20 mg DR capsule
20 | ORAL_CAPSULE | Freq: Every day | ORAL | 0 refills | 60.00000 days | Status: DC
Start: 2023-04-26 — End: 2023-07-23

## 2023-04-26 NOTE — Patient Instructions (Signed)
Omeprazole last ordered by RA 04/23/23 (OTC tab). Capsule pulled over below.     CPE 12/03/22 with RA

## 2023-05-01 ENCOUNTER — Inpatient Hospital Stay: Admit: 2023-05-01 | Payer: PRIVATE HEALTH INSURANCE | Primary: Family Medicine

## 2023-05-01 DIAGNOSIS — Z13820 Encounter for screening for osteoporosis: Secondary | ICD-10-CM

## 2023-05-01 DIAGNOSIS — Z780 Asymptomatic menopausal state: Secondary | ICD-10-CM

## 2023-06-13 ENCOUNTER — Ambulatory Visit
Admit: 2023-06-13 | Discharge: 2023-06-13 | Payer: PRIVATE HEALTH INSURANCE | Attending: Family | Primary: Family Medicine

## 2023-06-13 DIAGNOSIS — E1140 Type 2 diabetes mellitus with diabetic neuropathy, unspecified: Secondary | ICD-10-CM

## 2023-06-13 LAB — POCT RAPID STREP A: POCT Strep A Ag: NEGATIVE

## 2023-06-13 NOTE — Progress Notes (Signed)
[facility]  [facility]  [address]  [address]  [place] [address]  Dept: [phone]  Dept Fax: [phone]     Patient ID: [name] is a 72 y.o. female who presents for Follow-up (6 months ) and Sore Throat (Patient stated she has a sore throat/Painful swallowing /Patient has been having sore throat since Saturday 06/08/2023 /).    Subjective   Sore Throat     Patient presents today for a 6 mo follow-up. She reports she has had a ST, congestion and painful swallowing since 06/08/23. No fever, chills, cough, n/v/d or known sick contacts. Agreeable to COVID and strep testing.     Also reports some increased leg cramps. No calf pain, redness or swelling.     Type 2 DM Diabetes:   Hemoglobin A1C-7.1% 12/11/22, 6.9% 03/15/22  Urine microalbumin-Normal this year  Eye exam-Patient has DM eye exam scheduled for in November  Monofilament test- followed by podiatry, goes twice annually.   Taking Gabapentin for neuropathy.     Hypertension  On Losartan and Metoprolol     GERD  Taking Omeprazole     Depression  Taking Lexapro    Allergies   Allergen Reactions    Citalopram Hallucinations    Glimepiride Dizziness    Lisinopril Cough       Objective   Visit Vitals  BP 126/80 (BP Location: Left arm, Patient Position: Sitting, BP Cuff Size: Adult)   Pulse 50   Temp 36.8 C (98.2 F) (Oral)   Wt 75.5 kg Comment: 166.4 lbs   SpO2 96%   BMI 29.48 kg/m   OB Status Postmenopausal   BSA 1.83 m       Physical Exam  Constitutional:       Appearance: Normal appearance.   HENT:      Head: Normocephalic.      Right Ear: Tympanic membrane normal.      Left Ear: Tympanic membrane normal.      Nose: Congestion present.      Comments: Nares swollen, erythematous     Mouth/Throat:      Mouth: Mucous membranes are moist.      Pharynx: Oropharynx is clear. No oropharyngeal exudate or posterior oropharyngeal erythema.   Eyes:      Conjunctiva/sclera: Conjunctivae normal.   Cardiovascular:      Rate and Rhythm: Normal rate  and regular rhythm.      Heart sounds: No murmur heard.  Pulmonary:      Effort: Pulmonary effort is normal.      Breath sounds: Normal breath sounds.   Musculoskeletal:      Right lower leg: No edema.      Left lower leg: No edema.      Comments: No calf tenderness, redness or swelling bilat. Neg Homan's bilat. Normal pedal pulses.    Neurological:      Mental Status: She is alert.   Psychiatric:         Mood and Affect: Mood normal.         Behavior: Behavior normal.         Assessment/Plan   Macii was seen today for follow-up and sore throat.  Type 2 diabetes mellitus with diabetic neuropathy, without long-term current use of insulin (Multi-HCC)  Comments:  A1C improved, 6.6%.  Orders:  -     Hemoglobin A1c; Future  Sore throat  Comments:  Rapid strep neg. Will check COVID testing. Advised if sx worsening or  not improved by Monday would tx with abx. Warning signs reviewed. RTC if sx worsen.  Orders:  -     POCT rapid strep A  -     SARS CoV 2 RNA by PCR  Acute non-recurrent maxillary sinusitis  Comments:  Reviewed if labs neg can send tx if unimproved by Monday or if sx worsen. Pt in agreement.  Hyperkalemia  Comments:  Will recheck BMP.  Orders:  -     Comprehensive metabolic panel; Future  Leg cramping  Comments:  Labs ordered including magnesium.  Orders:  -     Comprehensive metabolic panel; Future  -     Magnesium; Future  Other orders  -     Comprehensive metabolic panel  -     Magnesium

## 2023-06-14 LAB — COMPREHENSIVE METABOLIC PANEL
ALT: 17 IU/L (ref 0–32)
AST: 16 IU/L (ref 0–40)
Albumin: 4.3 g/dL (ref 3.8–4.8)
Alk Phosphatase: 83 IU/L (ref 44–121)
Anion Gap: 13 mmol/L (ref 10.0–18.0)
BUN/Creat Ratio: 19 (ref 12–28)
BUN: 27 mg/dL (ref 8–27)
Bili Total: 0.2 mg/dL (ref 0.0–1.2)
Calcium: 9.4 mg/dL (ref 8.7–10.3)
Carbon Dioxide: 24 mmol/L (ref 20–29)
Chloride: 103 mmol/L (ref 96–106)
Creat: 1.39 mg/dL — ABNORMAL HIGH (ref 0.57–1.00)
Globulin Total: 2.3 g/dL (ref 1.5–4.5)
Glucose: 136 mg/dL — ABNORMAL HIGH (ref 70–99)
Potassium: 5.1 mmol/L (ref 3.5–5.2)
Protein Total: 6.6 g/dL (ref 6.0–8.5)
Sodium: 140 mmol/L (ref 134–144)
eGFR: 41 mL/min/{1.73_m2} — ABNORMAL LOW (ref >59)

## 2023-06-14 LAB — MAGNESIUM: Magnesium: 1.4 mg/dL — ABNORMAL LOW (ref 1.6–2.3)

## 2023-06-14 LAB — SARS COV 2 RNA BY PCR: SARS-CoV-2 NAA: NOT DETECTED

## 2023-06-14 LAB — HEMOGLOBIN A1C: HgbA1C: 6.6 % — ABNORMAL HIGH (ref 4.8–5.6)

## 2023-06-15 MED ORDER — amoxicillin (Amoxil) 500 mg capsule
500 | ORAL_CAPSULE | Freq: Two times a day (BID) | ORAL | 0 refills | Status: AC
Start: 2023-06-15 — End: 2023-06-25

## 2023-06-15 MED ORDER — amoxicillin (Amoxil) 875 mg tablet
875 | ORAL_TABLET | Freq: Two times a day (BID) | ORAL | 0 refills | Status: DC
Start: 2023-06-15 — End: 2023-06-15

## 2023-06-26 NOTE — Telephone Encounter (Signed)
Up to date on physical; next 11/2023 with RA

## 2023-07-04 MED ORDER — atorvastatin (Lipitor) 40 mg tablet
40 | ORAL_TABLET | Freq: Every evening | ORAL | 0 refills | 90.00000 days | Status: AC
Start: 2023-07-04 — End: ?

## 2023-07-04 MED ORDER — gabapentin (Neurontin) 300 mg capsule
300 | ORAL_CAPSULE | Freq: Two times a day (BID) | ORAL | 0 refills | Status: AC
Start: 2023-07-04 — End: 2023-10-02

## 2023-07-04 NOTE — Telephone Encounter (Signed)
Last seen 06/13/23   Gabapentin last fill 03/22/23  #[id number]  [place] fill statin 04/02/23 #90

## 2023-07-24 MED ORDER — omeprazole (PriLOSEC) 20 mg DR capsule
20 | ORAL_CAPSULE | Freq: Every day | ORAL | 0 refills | 60.00000 days | Status: AC
Start: 2023-07-24 — End: ?

## 2023-08-05 MED ORDER — albuterol 90 mcg/actuation inhaler
90 | Freq: Four times a day (QID) | RESPIRATORY_TRACT | 0 refills | Status: AC | PRN
Start: 2023-08-05 — End: ?

## 2023-08-11 ENCOUNTER — Inpatient Hospital Stay
Admit: 2023-08-11 | Disposition: A | Discharge status: Home / Self Care | Payer: PRIVATE HEALTH INSURANCE | Attending: Undersea and Hyperbaric Medicine | Primary: Family Medicine

## 2023-08-11 ENCOUNTER — Ambulatory Visit: Admit: 2023-08-11 | Payer: PRIVATE HEALTH INSURANCE | Primary: Family Medicine

## 2023-08-11 DIAGNOSIS — R051 Acute cough: Secondary | ICD-10-CM

## 2023-08-11 LAB — POCT SARS-COV-2/FLU A&B
POCT Flu A: NOT DETECTED
POCT Flu B: NOT DETECTED
POCT SARS-CoV-2: NOT DETECTED

## 2023-08-11 MED ORDER — cefpodoxime (Vantin) 200 mg tablet
200 | ORAL_TABLET | Freq: Two times a day (BID) | ORAL | 0 refills | 6.50000 days | Status: AC
Start: 2023-08-11 — End: 2023-08-18

## 2023-08-11 MED ORDER — azithromycin (Zithromax) 250 mg tablet
250 | ORAL_TABLET | ORAL | 0 refills | Status: AC
Start: 2023-08-11 — End: 2023-08-16

## 2023-08-11 MED ORDER — benzonatate (Tessalon) 200 mg capsule
200 | ORAL_CAPSULE | Freq: Three times a day (TID) | ORAL | 0 refills | 10.00000 days | Status: AC | PRN
Start: 2023-08-11 — End: 2023-08-18

## 2023-08-11 NOTE — ED Provider Notes (Signed)
History  Chief Complaint   Patient presents with    Cough     Patient's daughter reports that patient has been "sick" for 9 days with cough, losing voice, body aches, ear pain, facial pain and wheeze. Using prescribed inhaler for wheeze.     HPI  72 year old female below medical history including hypertension.  Brought to urgent care this afternoon by daughter.  Patient with 9 days of cough voice changes bodyaches ear pain facial pain wheezing.  Occasionally using prescribed inhaler.  Has not tried to call to be seen by primary care.  Has not tried any of the over-the-counter cough or cold medications.  Decreased p.o. intake.  Grandchildren who live with her have been sick.  No recent travel.    Patient Active Problem List   Diagnosis    Adjustment disorder with depressed mood (CMS-HCC)    Benign paroxysmal positional vertigo    Closed bimalleolar fracture    Closed fracture of talus    Dysfunction of both eustachian tubes    GERD (gastroesophageal reflux disease)    Hypercholesterolemia (CMS-HCC)    Intertrigo    Essential hypertension (CMS-HCC)    Knee buckling    Obesity (BMI 35.0-39.9 without comorbidity)    Plantar fasciitis of right foot    Pulmonary nodule    Thoracic back pain    Type 2 diabetes mellitus (Multi-HCC)    Type 2 diabetes mellitus with diabetic neuropathy, unspecified (Multi-HCC)    Stage 3b chronic kidney disease (Multi-HCC)    Anemia    Screening for colon cancer    Need for influenza vaccination    Routine adult health maintenance    Hypothyroidism (CMS-HCC)    Screening for osteoporosis    Abnormal CBC    Medication monitoring encounter    Dizziness    Tinnitus, bilateral    Viral URI with cough       Past Medical History:   Diagnosis Date    Adjustment disorder with depressed mood (CMS-HCC)     Diabetes mellitus (Multi-HCC)     GERD (gastroesophageal reflux disease)     Hypertension (CMS-HCC)     Intertrigo     Knee buckling     Plantar fasciitis of right foot     Pulmonary nodule      Thoracic back pain     Type 2 diabetes mellitus (Multi-HCC)     Type 2 diabetes mellitus with diabetic neuropathy, unspecified (Multi-HCC)        Past Surgical History:   Procedure Laterality Date    ANKLE SURGERY      ECTOPIC PREGNANCY SURGERY         Family History   Problem Relation Name Age of Onset    Diabetes Mother      Other (cirrhoisis of the liver) Mother      Heart attack Father      Hypertension Father      Heart disease Father      Other (ovarian/uterus CA) Sister      Hypertension Sister      Diabetes Sister      Lung cancer Sister      Breast cancer Sister      Diabetes Brother      Hypertension Brother         Social History     Tobacco Use    Smoking status: Never    Smokeless tobacco: Never   Vaping Use    Vaping Use: Never used   Substance  Use Topics    Alcohol use: Never    Drug use: Never       Review of Systems   Constitutional:  Negative for chills and fever.   HENT:  Positive for ear pain and voice change. Negative for sore throat.    Eyes:  Negative for pain and visual disturbance.   Respiratory:  Positive for cough and wheezing. Negative for shortness of breath.    Cardiovascular:  Negative for chest pain and palpitations.   Gastrointestinal:  Negative for abdominal pain and vomiting.   Genitourinary:  Negative for dysuria and hematuria.   Musculoskeletal:  Positive for myalgias. Negative for arthralgias and back pain.   Skin:  Negative for color change and rash.   Neurological:  Negative for seizures and syncope.   All other systems reviewed and are negative.      Physical Exam  Vitals:    08/11/23 1405   BP: (!) 132/47   BP Location: Right arm   Patient Position: Sitting   Pulse: 52   Resp: 16   Temp: 36.9 C (98.5 F)   TempSrc: Oral   SpO2: 100%   Weight: 75.3 kg   Height: 1.499 m       Physical Exam  Vitals and nursing note (Daughter present) reviewed.   Constitutional:       General: She is not in acute distress.     Appearance: Normal appearance. She is well-developed. She is not  ill-appearing.      Comments: Sitting on exam chair, no acute distress   HENT:      Head: Normocephalic and atraumatic.      Right Ear: Tympanic membrane, ear canal and external ear normal.      Left Ear: Tympanic membrane, ear canal and external ear normal.      Mouth/Throat:      Mouth: Mucous membranes are moist.      Pharynx: Oropharynx is clear.      Comments: Edentulous  Eyes:      Extraocular Movements: Extraocular movements intact.      Conjunctiva/sclera: Conjunctivae normal.      Pupils: Pupils are equal, round, and reactive to light.   Cardiovascular:      Rate and Rhythm: Normal rate and regular rhythm.      Pulses: Normal pulses.      Heart sounds: Normal heart sounds. No murmur heard.  Pulmonary:      Effort: Pulmonary effort is normal. No respiratory distress.      Breath sounds: Normal breath sounds.   Musculoskeletal:         General: No swelling. Normal range of motion.      Cervical back: Normal range of motion and neck supple.   Skin:     General: Skin is warm and dry.      Capillary Refill: Capillary refill takes less than 2 seconds.   Neurological:      General: No focal deficit present.      Mental Status: She is alert and oriented to person, place, and time.      Cranial Nerves: No cranial nerve deficit.      Sensory: No sensory deficit.      Motor: No weakness.   Psychiatric:         Mood and Affect: Mood normal.         Behavior: Behavior normal.              XR CHEST 2 VIEWS   Final  Result   Left basilar opacity may represent focal pneumonia in the correct clinical setting. Advise follow-up chest radiograph 8 weeks to ensure resolution.      Rayvon Char, MD 08/11/2023 2:53 PM        Labs Reviewed   POCT SARS-COV-2/FLU A&B - Normal       Result Value    POCT SARS-CoV-2 Not Detected      POCT Flu A Not Detected      POCT Flu B Not Detected      Narrative:     This test was performed with the [facility] SARS-COV-2 & Influenza A/B RNA by RT PCR molecular test. Not detected results do not  rule out infection with COVID-19, Influenza A and/or Influenza B. This test is offered under Emergency use Authorization by the FDA.       Procedures  Procedures    UC Course  ED Course as of 08/11/23 1529   Sun Aug 11, 2023   1432 Patient and daughter updated regarding laboratory studies   1457 Patient and daughter updated regarding left lower lung pneumonia.         Diagnoses as of 08/11/23 1529   Acute cough   Pneumonia of left lower lobe due to infectious organism       Medical Decision Making  72 year old female hemodynamically stable, largely intact afebrile.  Left lower lung pneumonia.  Prescribed cefpodoxime and azithromycin for community-acquired pneumonia.  Tessalon for cough.  Advised can use other over-the-counter cough and cold medications.  Patient with known history of renal insufficiency, with laboratory studies September 2024 showing GFR of approximately 41 with creatinine of 1.39.  Patient and daughter advised to call primary care physician tomorrow August 12, 2023.  Drink fluids as tolerated.  Tylenol for discomfort of aches, pains or fever.    Problems Addressed:  Acute cough: complicated acute illness or injury  Pneumonia of left lower lobe due to infectious organism: complicated acute illness or injury    Amount and/or Complexity of Data Reviewed  Labs: ordered.  Radiology: ordered.    Risk  Prescription drug management.        Discharge Meds  ED Prescriptions       Medication Sig Dispense Start Date End Date Auth. Provider    cefpodoxime (Vantin) 200 mg tablet Take 1 tablet (200 mg) by mouth twice daily for 7 days. 14 tablet 08/11/2023 08/18/2023 Rosanne Ashing, MD    azithromycin (Zithromax) 250 mg tablet Take 2 tablets (500 mg) by mouth once daily for 1 day, THEN 1 tablet (250 mg) once daily for 4 days. 6 tablet 08/11/2023 08/16/2023 Rosanne Ashing, MD    benzonatate (Tessalon) 200 mg capsule Take 1 capsule (200 mg) by mouth if needed in the morning, at noon, and at bedtime for  cough for up to 7 days. Do not crush or chew. 20 capsule 08/11/2023 08/18/2023 Rosanne Ashing, MD            Home Meds  Prior to Admission medications    Medication Sig Start Date End Date Taking? Authorizing Provider   albuterol 90 mcg/actuation inhaler Inhale 2 puffs every 6 (six) hours if needed for wheezing or shortness of breath. 08/05/23   Mirna Mires, FNP   aspirin 81 mg EC tablet Take 1 tablet by mouth in the morning.    Historical Provider, MD   atorvastatin (Lipitor) 40 mg tablet Take 1 tablet (40 mg) by mouth at bedtime. 07/04/23   Peggye Form, MD  cholecalciferol (Vitamin D-3) 25 MCG (1000 UT) capsule Take 1 capsule by mouth in the morning.    Historical Provider, MD   clotrimazole (Lotrimin) 1 % cream APPLY TOPICALLY IF NEEDED IN THE MORNING AND AT BEDTIME (INTERTRIGO)    Historical Provider, MD   clotrimazole-betamethasone (Lotrisone) cream Take by mouth twice daily.    Historical Provider, MD   escitalopram (Lexapro) 20 mg tablet Take 1 tablet by mouth once daily 06/26/23   Denna Haggard, NP   fluticasone (Flonase) 50 mcg/actuation nasal spray Administer 2 sprays into each nostril in the morning. Shake gently. Before first use, prime pump. After use, clean tip and replace cap. 08/08/21 01/24/23  Mirna Mires, FNP   gabapentin (Neurontin) 300 mg capsule Take 1 capsule (300 mg) by mouth twice daily. 07/04/23 10/02/23  Emelyn Molato, MD   losartan (Cozaar) 25 mg tablet TAKE 1 TABLET BY MOUTH IN THE MORNING 06/26/23   Denna Haggard, NP   metFORMIN (Glucophage) 1,000 mg tablet Take 1 tablet (1,000 mg) by mouth with breakfast and with evening meal. 11/28/22 11/28/23  Emelyn Molato, MD   metoprolol tartrate (Lopressor) 100 mg tablet Take 1 tablet (100 mg) by mouth twice daily. 06/28/22 01/24/23  Peggye Form, MD   multivit with minerals/lutein (MULTIVITAMIN 50 PLUS ORAL) Take 1 tablet by mouth in the morning. 12/29/12   Historical Provider, MD   nystatin (Mycostatin) 100,000 unit/gram powder Apply  topically twice daily. 12/03/22   Mirna Mires, FNP   omeprazole (PriLOSEC) 20 mg DR capsule Take 1 capsule (20 mg) by mouth once daily. Do not crush or chew. 07/24/23   Alexa Aboshar, PA   pioglitazone (Actos) 30 mg tablet Take 1 tablet by mouth once daily 06/18/23   Alexa Aboshar, PA         I spent 30 minutes preparing to see the patient, taking the patient's history, performing a medically appropriate exam, counseling and educating the patient/family/caregiver, ordering medications, tests, or procedures. Time also spent referring to and/or communicating with other healthcare professionals and documenting clinical information in the health record. I reviewed labs and imaging and communicated results to the patient / family / caregiver. Time reported excludes time spent on any other billable service.      Patient encounter note may have been created using voice recognition software and in real time during the office visit. Please excuse any typographical errors that may not have been edited out.            Rosanne Ashing, MD  08/11/23 1529

## 2023-08-11 NOTE — Discharge Instructions (Addendum)
As discussed, COVID and flu test are negative.  You do have a pneumonia in the left lung.  Antibiotics have been prescribed as well as medication for cough.  Swallow the cough medication whole, do not chew or bite it.  Otherwise you can also use medication such as Delsym, Robitussin or Mucinex.  Drink plenty fluids.  Or as limited by kidney function.  Please call primary care physician about August 12, 2023 to schedule follow-up.  Any worsening symptoms or concerns go directly to the emergency department.  You can use medication such as Tylenol for aches, pains or fever.  Try not to take any medication such as Motrin, Advil Aleve, as this can affect kidney function.

## 2023-10-07 MED ORDER — atorvastatin (Lipitor) 40 mg tablet
40 | ORAL_TABLET | Freq: Every evening | ORAL | 1 refills | 90.00000 days | Status: AC
Start: 2023-10-07 — End: ?

## 2023-10-08 ENCOUNTER — Encounter: Payer: PRIVATE HEALTH INSURANCE | Attending: Otolaryngology | Primary: Family Medicine

## 2023-10-23 ENCOUNTER — Encounter: Payer: PRIVATE HEALTH INSURANCE | Attending: Otolaryngology | Primary: Family Medicine

## 2023-10-27 MED ORDER — gabapentin (Neurontin) 300 mg capsule
300 | ORAL_CAPSULE | Freq: Two times a day (BID) | ORAL | 0 refills | Status: AC
Start: 2023-10-27 — End: 2024-01-25

## 2023-12-05 ENCOUNTER — Encounter: Payer: PRIVATE HEALTH INSURANCE | Attending: Family | Primary: Family Medicine

## 2023-12-05 NOTE — Progress Notes (Deleted)
 [facility]  [facility]  [address]  [address]  [place] [address]  Dept: [phone]  Dept Fax: [phone]     Patient ID: [name] is a 73 y.o. female who presents for [facility] Annual Wellness Visit.    Subjective   HPI  [name] is a 73 y.o. female and is here for a annual medicare wellness visit and comprehensive physical exam.      Concerns include itching of scalp on and off for 3 months. Using Tgel. Also reports a lesion of her groin by her c-section scar that comes and goes. Treating with clotrimazole and betamethasone. Also has trouble falling asleep. Her anxiety is what keeps her up at night, worries often. Vertigo is also flaring - hx of this. No congestion.    Do you take any herbs or supplements that were not prescribed by a doctor? MV, Calium, Vit D     Type 2 DM Diabetes:   Hemoglobin A1C-6.9% 03/15/22  Urine microalbumin-ordered today   Eye exam-Advised scheduling annual.   Monofilament test- followed by podiatry, goes twice annually.   Taking Gabapentin for neuropathy.     Hypertension  On Losartan and Metoprolol     GERD  Taking Omeprazole     Depression  Taking Lexapro     Advised dental exam every 6 months.  Eye exam every year  Podiatrist- 2x a year.      SCREENING:  Mammogram-12/2021, normal, has next scheduled.  Colonoscopy- Refuses- Agreeable to cologuard.      IMMUNIZATIONS:  Td-12/14/16  Received 2 doses of COVID vaccine  UTD on Pneumonia vaccine     Denies memory concerns, unable to perform complete screening with validated tool as declines interpreter but lack of use is barrier to accurate screening.      Review of Systems   Constitutional: Negative.    HENT: Negative.    Eyes: Negative.    Respiratory: Negative.    Cardiovascular: Negative.    Gastrointestinal: Negative.    Genitourinary: Negative.    Musculoskeletal: Negative.    Skin: Area of irritation of c-section star. Itching of scalp.   Neurological: Negative.    Endo/Heme/Allergies: Negative.     Psychiatric/Behavioral: Anxiety, sleeping difficulty.   Allergies   Allergen Reactions   . Citalopram Hallucinations   . Glimepiride Dizziness   . Lisinopril Cough       Health Risk Assessment - No pertinent positives.             Objective   Visit Vitals  OB Status Postmenopausal       Physical Exam  Constitutional:       Appearance: Normal appearance.   HENT:      Head: Normocephalic.      Right Ear: Tympanic membrane normal.      Left Ear: Tympanic membrane normal.      Nose: Nose normal.      Mouth/Throat:      Mouth: Mucous membranes are moist.      Pharynx: Oropharynx is clear. No posterior oropharyngeal erythema.   Eyes:      Conjunctiva/sclera: Conjunctivae normal.      Pupils: Pupils are equal, round, and reactive to light.   Neck:      Vascular: No carotid bruit.   Cardiovascular:      Rate and Rhythm: Normal rate and regular rhythm.      Pulses: Normal pulses.      Heart sounds: Normal heart sounds.  No murmur heard.  Pulmonary:      Effort: Pulmonary effort is normal.      Breath sounds: Normal breath sounds.   Chest:   Breasts:     Right: Normal. No mass or skin change.      Left: Normal. No mass or skin change.   Abdominal:      General: Abdomen is flat. Bowel sounds are normal.      Palpations: Abdomen is soft.   Genitourinary:     Comments: Pt declined  Musculoskeletal:         General: Normal range of motion.      Cervical back: Neck supple.   Lymphadenopathy:      Cervical: No cervical adenopathy.      Upper Body:      Right upper body: No axillary adenopathy.      Left upper body: No axillary adenopathy.   Skin:     General: Skin is warm.      Findings: Rash present.      Comments: sm pink patch of R abdominal fold   Neurological:      General: No focal deficit present.      Mental Status: She is alert and oriented to person, place, and time.   Psychiatric:         Mood and Affect: Mood normal.         Behavior: Behavior normal.              Assessment/Plan   There are no diagnoses linked to this  encounter.      Advance Care Planning  Advance Directive/Living Will: No   Health Care Power of Attorney:  Advised to establish and bring copy  Time in minutes Spent Discussing Advanced Directives: 1-15 minutes    Medicare Screenings Reviewed         Interpretation: Negative screening.       Maintain annual screening - No additional Follow-up required     Patient Care Team:  Peggye Form, MD as PCP - General    Major risk factors and recommendations:     Advised to increase physical activity, recommend meeting with [employer] for increased socialization. Obtain annual eye exam, keep UTD with vision care. Medications are well-tolerated and affordable.

## 2023-12-20 NOTE — Telephone Encounter (Signed)
 LAST REFILL :  09/26/23    LOV-06/13/23  LCPE-12/03/22  NOV-01/16/24

## 2023-12-23 MED ORDER — losartan (Cozaar) 25 mg tablet
25 | ORAL_TABLET | Freq: Every morning | ORAL | 0 refills | 90.00000 days | Status: DC
Start: 2023-12-23 — End: 2024-02-14

## 2023-12-23 NOTE — Telephone Encounter (Signed)
 Duplicated order.

## 2024-01-16 ENCOUNTER — Ambulatory Visit
Admit: 2024-01-16 | Discharge: 2024-01-16 | Payer: PRIVATE HEALTH INSURANCE | Attending: Family Medicine | Primary: Family Medicine

## 2024-01-16 DIAGNOSIS — Z0000 Encounter for general adult medical examination without abnormal findings: Secondary | ICD-10-CM

## 2024-01-16 NOTE — Progress Notes (Signed)
 [facility]  [address]  [address]  [place] [address]  Dept: [phone]  Dept Fax: [phone]     Patient ID: [name] is a 73 y.o. female who presents for Subsequent Medicare Annual Wellness Visit (TD 2018, due pcv20, mam 02/13/23, cologuard 2024 not done, due MOLST. SOB when going outside/increased activity. L leg feels cold. Visual hallucinations at bedtime x few months. ).    Subjective   [name] is a 73 y.o. female and is here for a comprehensive physical exam.  Here with her daughter who translate for her    The patient reports   Wake up at night with visual hallucinations- creature, person  Tightness with breathing when she goes out in the cold.    Do you take any herbs or supplements that were not prescribed by a doctor? MV     Type 2 DM Diabetes:   Hemoglobin A1C-6.9%   Urine microalbumin-ordered today   Eye exam- 08/22/23  Monofilament test- followed by podiatry, goes twice annually.   Taking Gabapentin  for neuropathy.     Hypertension  On Losartan  and Metoprolol      GERD  Taking Omeprazole      Depression  Taking Lexapro      Advised dental exam every 6 months.  Eye exam every year  Podiatrist- 2x a year.      SCREENING:  Mammogram-02/03/23- Birads 1- Negative- Lifetime risk-8.70%    Bone density-05/01/23- Normal  Colonoscopy- Refuses any screening     IMMUNIZATIONS:  Td-12/14/16  Received 2 doses of COVID vaccine  Did not get Flu vaccine last season  PCV 20 today    MSSP: Have you fallen in the past year? No   Diabetes Is the patientis A1C in control. Yes  Depression Was the patient screened for depression in the current year? Yes- positive On Lexapro   Breast Cancer Screening Patient screened within 2 years? Yes  Colorectal Cancer Screening- Refused  Patient had: Influenza Vaccination for current year (administered in office or elsewhere)- Did not Flu vaccine this flu season  Tobacco Use Was the patient screened for tobacco use (current year)- Non smoker  Statin Therapy Does the  patient have any of the following: Yes  Cost of medication discussed on:  01/16/24    Review of Systems   Constitutional: Negative.    Respiratory: Negative.     Cardiovascular: Negative.    Gastrointestinal: Negative.    Genitourinary: Negative.    Neurological: Negative.       Problem List[1]  Current Outpatient Medications   Medication Instructions    albuterol  90 mcg/actuation inhaler 2 puffs, inhalation, Every 6 hours PRN    aspirin 81 mg EC tablet 1 tablet, Daily    atorvastatin  (LIPITOR) 40 mg, oral, Nightly    cholecalciferol (Vitamin D-3) 25 MCG (1000 UT) capsule 1 capsule, Daily    clotrimazole  (Lotrimin ) 1 % cream APPLY TOPICALLY IF NEEDED IN THE MORNING AND AT BEDTIME (INTERTRIGO)    clotrimazole -betamethasone  (Lotrisone ) cream 2 times daily    escitalopram  (LEXAPRO ) 20 mg, oral, Daily    fluticasone  (Flonase ) 50 mcg/actuation nasal spray 2 sprays, Each Nostril, Daily, Shake gently. Before first use, prime pump. After use, clean tip and replace cap.    gabapentin  (NEURONTIN ) 300 mg, oral, 2 times daily    losartan  (COZAAR ) 25 mg, oral, Every morning    metFORMIN  (GLUCOPHAGE ) 1,000 mg, oral, 2 times daily with meals    metoprolol  tartrate (LOPRESSOR ) 100 mg, oral, 2 times  daily    multivit with minerals/lutein (MULTIVITAMIN 50 PLUS ORAL) 1 tablet, Daily RT    nystatin  (Mycostatin ) 100,000 unit/gram powder Topical, 2 times daily    omeprazole  (PRILOSEC ) 20 mg, oral, Daily, Do not crush or chew    pioglitazone  (ACTOS ) 30 mg, oral, Daily     Allergies[2]  Social History[3]    Objective   Visit Vitals  BP 130/82   Pulse 50   Ht 1.473 m Comment: 58 in   Wt 76.7 kg Comment: 169 lb   SpO2 98%   BMI 35.32 kg/m   OB Status Postmenopausal   BSA 1.77 m     Physical Exam  Vitals reviewed.   Constitutional:       Appearance: Normal appearance. She is well-developed. She is obese.   HENT:      Head: Normocephalic and atraumatic.      Right Ear: Tympanic membrane, ear canal and external ear normal.      Left Ear:  Tympanic membrane, ear canal and external ear normal.      Nose: Nose normal.      Mouth/Throat:      Pharynx: Oropharynx is clear.   Eyes:      General: Lids are normal.      Extraocular Movements: Extraocular movements intact.      Conjunctiva/sclera: Conjunctivae normal.      Pupils: Pupils are equal, round, and reactive to light.   Cardiovascular:      Rate and Rhythm: Normal rate and regular rhythm.      Pulses: Normal pulses.      Heart sounds: Normal heart sounds. No murmur heard.  Pulmonary:      Effort: Pulmonary effort is normal.      Breath sounds: Normal breath sounds and air entry. No wheezing or rales.   Chest:   Breasts:     Right: Normal. No mass, skin change or tenderness.      Left: Normal. No mass, skin change or tenderness.   Abdominal:      General: Bowel sounds are normal.      Palpations: Abdomen is soft.      Tenderness: There is no abdominal tenderness.   Musculoskeletal:      Cervical back: Full passive range of motion without pain, normal range of motion and neck supple.      Right lower leg: No edema.      Left lower leg: No edema.   Lymphadenopathy:      Upper Body:      Right upper body: No axillary adenopathy.      Left upper body: No axillary adenopathy.   Skin:     Findings: No lesion or rash.   Neurological:      General: No focal deficit present.      Mental Status: She is alert and oriented to person, place, and time.      Cranial Nerves: No cranial nerve deficit.      Motor: Motor function is intact.      Coordination: Coordination is intact.      Gait: Gait is intact.   Psychiatric:         Mood and Affect: Mood and affect normal.         Behavior: Behavior normal.         Thought Content: Thought content normal.       Assessment/Plan   [name] was seen today for subsequent medicare annual wellness visit.  Routine adult health maintenance  Assessment &  Plan:  Normal exam. Mammogram is up to date. Declines any form of colon cancer screening. Healthy diet. Regular physical  activities,Routine eye and dental exam. Routine immunizations updated- PCV20 given today.   Encounter for immunization  -     Pneumococcal conjugate vaccine 20-valent IM  Stage 3b chronic kidney disease (CMS-HCC)  Assessment & Plan:  Stable. On Losartan  for renal protection.   Orders:  -     Basic metabolic panel; Future  Type 2 diabetes mellitus with diabetic neuropathy, without long-term current use of insulin (Multi-HCC)  Assessment & Plan:  Hemoglobin A1C is at goal. Continue current medications. Eye exam is up to date- will get report. Diabetic diet.  Orders:  -     Hemoglobin A1c; Future  -     Albumin, Urine, Random (Microalbumin Random, Including Creatinine)  Adjustment disorder with depressed mood   Assessment & Plan:  Stable. On Lexapro   Essential hypertension   Assessment & Plan:  BP in good control. Continue current medication. Low salt diet  Orders:  -     Basic metabolic panel; Future  -     Magnesium ; Future  Gastroesophageal reflux disease without esophagitis  Assessment & Plan:  Stable on Omeprazole .  Hypercholesterolemia   Assessment & Plan:  Continue Atorvastatin .  Hypnopompic hallucination  Assessment & Plan:  Likely due to Lexapro . She wants to continue Lexapro .  History of hypothyroidism  -     TSH with reflex; Future  History of anemia  -     CBC and differential - WAM and non-WAM; Future  Moderate major depression (Multi-HCC)  Assessment & Plan:  On Lexapro . Wants to continue the same medication.   Obesity (BMI 35.0-39.9 without comorbidity)  Assessment & Plan:  Discussed healthy diet and regular physical activities.  Colonoscopy refused      Patient Health Questionnaire-9 Score: 10   Interpretation: Positive screening.      Follow-up & Interventions: Patient currently receiving treatment for depression    Screening, Brief Intervention, and Referral to Treatment:SBIRT Alcohol Score: 0 Negative for Alcohol Use Disorder     SBIRT Drugs Score: 0 Negative for Drug Use Disorder    Follow up &  Intervention: Continue annual screening  Substance Use Screening Time: 2 minutes spent assessing and managing for substance use disorders.    Generalized Anxiety Disorder Screening - GAD-7 Score: 9   Interpretation: Positive screening.  Follow up & Intervention: Patient currently receiving treatment for anxiety             [1]   Patient Active Problem List  Diagnosis    Adjustment disorder with depressed mood     Benign paroxysmal positional vertigo    Closed bimalleolar fracture    Closed fracture of talus    Dysfunction of both eustachian tubes    GERD (gastroesophageal reflux disease)    Hypercholesterolemia     Essential hypertension     Knee buckling    Obesity (BMI 35.0-39.9 without comorbidity)    Plantar fasciitis of right foot    Pulmonary nodule    Thoracic back pain    Type 2 diabetes mellitus (Multi-HCC)    Type 2 diabetes mellitus with diabetic neuropathy, unspecified (Multi-HCC)    Stage 3b chronic kidney disease (CMS-HCC)    Anemia    Screening for colon cancer    Routine adult health maintenance    Hypothyroidism     Screening for osteoporosis    Medication monitoring encounter    Tinnitus, bilateral  Viral URI with cough    Hypnopompic hallucination    Encounter for immunization    History of hypothyroidism    History of anemia    Encounter for screening examination for other mental health and behavioral disorders    Moderate major depression (Multi-HCC)    Colonoscopy refused   [2]   Allergies  Allergen Reactions    Citalopram Hallucinations    Glimepiride Dizziness    Lisinopril Cough   [3]   Social History  Socioeconomic History    Marital status: Married     Spouse name: None    Number of children: None    Years of education: None    Highest education level: None   Occupational History    None   Tobacco Use    Smoking status: Never    Smokeless tobacco: Never   Vaping Use    Vaping status: Never Used   Substance and Sexual Activity    Alcohol use: Never    Drug use: Never    Sexual activity:  Defer   Other Topics Concern    None   Social History Narrative    Social History: Sexual History  Had sex in the past 12 months (vaginal, oral, or anal)? No, Have you ever had an STD? No. Alcohol Smart Form  Did you have a drink containing alcohol in the past year? No, Points 0, Interpretation Negative. Tobacco Use  Status: never smoker, Patient counseled on the dangers of tobacco use: 09/13/2020. Seat belt: yes. no Alcohol. no Drug use. Diet/Nutrition: Three meals a day, Dairy, Fruits & Vegetables, Protein Foods. no Exercise. Marital Status: married, husband - Antonio. no Domestic violence. Children: two Daughters. Number of household members: 6, self, husband, daughter, son in law, grand daughter, grandson. Occupation: retired. no Travel outside [place] . Caffeine: coffee in the am. Home smoke detector use: yes. Pets: 2 cat, 3 dogs (chihuahuas). Sexual orientation: heterosexual. Sexually active: not currently.      Social Determinants of Health     Financial Resource Strain: Medium Risk (01/16/2024)    Overall Financial Resource Strain (CARDIA)     Difficulty of Paying Living Expenses: Somewhat hard   Food Insecurity: Unknown (01/16/2024)    Hunger Vital Sign     Worried About Running Out of Food in the Last Year: Never true     Ran Out of Food in the Last Year: Not on file   Transportation Needs: No Transportation Needs (01/16/2024)    PRAPARE - Therapist, art (Medical): No     Lack of Transportation (Non-Medical): No   Physical Activity: Inactive (01/16/2024)    Exercise Vital Sign     Days of Exercise per Week: 0 days     Minutes of Exercise per Session: 0 min   Stress: Stress Concern Present (01/16/2024)    [employer] of Occupational Health - Occupational Stress Questionnaire     Feeling of Stress : Rather much   Social Connections: Unknown (01/16/2024)    Social Connection and Isolation Panel [NHANES]     Frequency of Communication with Friends and Family: Three times a week      Frequency of Social Gatherings with Friends and Family: Not on file     Attends Religious Services: Not on file     Active Member of Clubs or Organizations: No     Attends Banker Meetings: Not on file     Marital Status: Not on file   Intimate  Partner Violence: Not At Risk (12/03/2022)    Humiliation, Afraid, Rape, and Kick questionnaire     Fear of Current or Ex-Partner: No     Emotionally Abused: No     Physically Abused: No     Sexually Abused: No   Housing Stability: Unknown (01/16/2024)    Housing Stability Vital Sign     Unable to Pay for Housing in the Last Year: Not on file     Number of Times Moved in the Last Year: Not on file     Homeless in the Last Year: No

## 2024-01-16 NOTE — Patient Instructions (Addendum)
 Counseling Resource Finder:  www.psychologytoday.com  [place] Behavior[facility] Health Help Line [facility]): [phone].    Call or text 24/7 365 day per year and is available for all residents of [place].  Emergency services are available to all, irrespective of insurance status or carrier. This program provides clinical assessment and direct callers to local [facility].  Counseling Resource Finder:  www.psychologytoday.com  [place] [facility] Health Help Line [facility]): [phone].    Call or text 24/7 365 day per year and is available for all residents of [place].  Emergency services are available to all, irrespective of insurance status or carrier. This program provides clinical assessment and direct callers to local [facility].

## 2024-01-17 ENCOUNTER — Inpatient Hospital Stay: Admit: 2024-01-17 | Payer: MEDICARE | Primary: Family Medicine

## 2024-01-17 DIAGNOSIS — R001 Bradycardia, unspecified: Secondary | ICD-10-CM

## 2024-01-17 DIAGNOSIS — E875 Hyperkalemia: Secondary | ICD-10-CM

## 2024-01-17 LAB — CBC W/DIFF
Baso Abs: 0.1 10*3/uL (ref 0.0–0.2)
Basos: 1 %
Eos Abs: 0.4 10*3/uL (ref 0.0–0.4)
Eos: 4 %
Hct: 31.9 % — ABNORMAL LOW (ref 34.0–46.6)
Hgb: 9.5 g/dL — ABNORMAL LOW (ref 11.1–15.9)
Immature Grans Abs: 0.1 10*3/uL (ref 0.0–0.1)
Immature Granulocytes: 1 %
Lymphs Abs: 3 10*3/uL (ref 0.7–3.1)
Lymphs: 25 %
MCH: 24.1 pg — ABNORMAL LOW (ref 26.6–33.0)
MCHC: 29.8 g/dL — ABNORMAL LOW (ref 31.5–35.7)
MCV: 81 fL (ref 79–97)
Monocytes: 9 %
MonocytesAbs: 1 10*3/uL — ABNORMAL HIGH (ref 0.1–0.9)
Neutrophils Abs: 7.2 10*3/uL — ABNORMAL HIGH (ref 1.4–7.0)
Neutrophils: 60 %
Platelets: 233 10*3/uL (ref 150–450)
RBC: 3.94 x10E6/uL (ref 3.77–5.28)
RDW: 14.3 % (ref 11.7–15.4)
WBC: 11.8 10*3/uL — ABNORMAL HIGH (ref 3.4–10.8)

## 2024-01-17 LAB — ALBUMIN, URINE, RANDOM (INCLUDING CREATININE)
Alb/Creat Ratio: 12 mg/g{creat} (ref 0–29)
Albumin Ur: 24.5 ug/mL
Creat Ur: 208.7 mg/dL

## 2024-01-17 LAB — BASIC METABOLIC PANEL
Anion Gap: 15 mmol/L (ref 10.0–18.0)
BUN/Creat Ratio: 22 (ref 12–28)
BUN: 27 mg/dL (ref 8–27)
Calcium: 9.2 mg/dL (ref 8.7–10.3)
Carbon Dioxide: 22 mmol/L (ref 20–29)
Chloride: 101 mmol/L (ref 96–106)
Creat: 1.24 mg/dL — ABNORMAL HIGH (ref 0.57–1.00)
Glucose: 111 mg/dL — ABNORMAL HIGH (ref 70–99)
Potassium: 6.1 mmol/L — CR (ref 3.5–5.2)
Sodium: 138 mmol/L (ref 134–144)
eGFR: 47 mL/min/{1.73_m2} — ABNORMAL LOW (ref >59)

## 2024-01-17 LAB — MAGNESIUM: Magnesium: 1.6 mg/dL (ref 1.6–2.3)

## 2024-01-17 LAB — HEMOGLOBIN A1C: HgbA1C: 6.6 % — ABNORMAL HIGH (ref 4.8–5.6)

## 2024-01-17 LAB — TSH WITH REFLEX: TSH: 4.01 u[IU]/mL (ref 0.450–4.500)

## 2024-01-17 LAB — POTASSIUM: Potassium: 6.2 mmol/L — CR (ref 3.6–5.2)

## 2024-01-17 NOTE — Telephone Encounter (Signed)
 Called [facility] and confirmed that we got the critical result

## 2024-01-17 NOTE — Telephone Encounter (Signed)
 I spoke with daughter [name] regarding test results  I advised to go for stat repeat K and EKG done  If develop any palpitations, weakness ,chest pian, shortness of breath to ED. I will follow up  result  I also encourage to do colon cancer screening- will send cologuard She refused colonoscopy

## 2024-01-17 NOTE — Progress Notes (Signed)
 After hour call [facility] w critical potassium 6.2.  see EMR encounter for today, lab is addressed w POC

## 2024-01-17 NOTE — Telephone Encounter (Signed)
 [facility] LM with answering service at 206pm stating there is a critical lab result.  Please call them back

## 2024-01-17 NOTE — Telephone Encounter (Signed)
 Received call about critical lab value.     Potassium: 6.1

## 2024-01-17 NOTE — Addendum Note (Signed)
 Addended by: Arno Lapidus on: 01/17/2024 07:09 PM     Modules accepted: Orders

## 2024-01-17 NOTE — Telephone Encounter (Addendum)
 Repeat K is 6.2. EKG did not show signs of hyperkalemia (no peak T waves) No significant ST-T wave changes. I discussed the result with her daughter. The high K could be from Losartan . I advised to discontinue Losartan .  The daughter also mentioned that she is eating banana everyday- advised to stopped eating banana. For now. Monitor BP at home. Will adjust medication if BP >/=140/90 . Will recheck K on Monday. I advised to monitor symptoms If she develop chest pain,shortness of breath, weakness,dizziness, palpitations- to bring to ED. Daughter agrees with the plan

## 2024-01-18 LAB — IRON, FERRITIN, TIBC
Ferritin: 10 ng/mL — ABNORMAL LOW (ref 15–150)
Iron Bind.Cap.(TIBC): 539 ug/dL — ABNORMAL HIGH (ref 250–450)
Iron Saturation: 8 % — ABNORMAL LOW (ref 15–55)
Iron: 44 ug/dL (ref 27–139)
UIBC: 495 ug/dL — ABNORMAL HIGH (ref 118–369)

## 2024-01-19 NOTE — Assessment & Plan Note (Signed)
 Discussed healthy diet and regular physical activities

## 2024-01-19 NOTE — Assessment & Plan Note (Signed)
 Stable on Omeprazole.

## 2024-01-19 NOTE — Assessment & Plan Note (Signed)
 Hemoglobin A1C is at goal. Continue current medications. Eye exam is up to date- will get report. Diabetic diet.

## 2024-01-19 NOTE — Assessment & Plan Note (Signed)
 Normal exam. Mammogram is up to date. Declines any form of colon cancer screening. Healthy diet. Regular physical activities,Routine eye and dental exam. Routine immunizations updated- PCV20 given today.

## 2024-01-19 NOTE — Assessment & Plan Note (Signed)
 Continue Atorvastatin

## 2024-01-19 NOTE — Assessment & Plan Note (Signed)
 Likely due to Lexapro . She wants to continue Lexapro .

## 2024-01-19 NOTE — Assessment & Plan Note (Signed)
 On Lexapro . Wants to continue the same medication.

## 2024-01-19 NOTE — Assessment & Plan Note (Signed)
 Stable. On Losartan  for renal protection.

## 2024-01-19 NOTE — Assessment & Plan Note (Signed)
 BP in good control. Continue current medication. Low salt diet

## 2024-01-19 NOTE — Assessment & Plan Note (Signed)
Stable. On Lexapro

## 2024-01-20 LAB — BASIC METABOLIC PANEL
Anion Gap: 6 mmol/L (ref 3–14)
BUN: 28 mg/dL — ABNORMAL HIGH (ref 6–24)
Calcium: 8.9 mg/dL (ref 8.5–10.5)
Carbon Dioxide: 26 mmol/L (ref 20–32)
Chloride: 107 mmol/L (ref 98–110)
Creat: 1.23 mg/dL (ref 0.55–1.30)
Glucose: 152 mg/dL — ABNORMAL HIGH (ref 70–110)
Potassium: 5.4 mmol/L — ABNORMAL HIGH (ref 3.6–5.2)
Sodium: 139 mmol/L (ref 135–146)
eGFR: 47 mL/min/{1.73_m2} — ABNORMAL LOW (ref >=60)

## 2024-01-22 MED ORDER — hydroCHLOROthiazide (Microzide) 12.5 mg tablet
12.5 | ORAL_TABLET | Freq: Every day | ORAL | 0 refills | Status: DC
Start: 2024-01-22 — End: 2024-02-16

## 2024-01-22 NOTE — Telephone Encounter (Addendum)
 Daughter called stating EM called on Friday but did not leave VM. She is returning call, also sent portal msg.    States pt's K is decreasing, but her BP has been elevated from d/c of losartan . Wants to know if she should increase metoprolol . Systolic around 145-165.

## 2024-01-22 NOTE — Telephone Encounter (Signed)
 Noted.

## 2024-01-22 NOTE — Telephone Encounter (Addendum)
 Yes, the K is lower  it is likely due to Losartan . I cannot increase Metoprolol  as she has low heart rate already. I sent Rx for Hydrochlorothiazide . Pls advise Follow up in 2 weeks . We will recheck BMP in 2 weeks after  starting HCTZ

## 2024-01-22 NOTE — Telephone Encounter (Signed)
 Spoke with daughter, she is aware. We booked a f/u in 2 wks

## 2024-01-22 NOTE — Telephone Encounter (Signed)
 VOID

## 2024-01-22 NOTE — Telephone Encounter (Signed)
 See other TE.

## 2024-01-22 NOTE — Addendum Note (Signed)
 Addended by: Arno Lapidus on: 01/22/2024 04:29 PM     Modules accepted: Orders

## 2024-01-29 MED ORDER — hydroCHLOROthiazide (HYDRODiuril) 25 mg tablet
25 | ORAL_TABLET | Freq: Every day | ORAL | 0 refills | 90.00000 days | Status: DC
Start: 2024-01-29 — End: 2024-02-16

## 2024-01-29 NOTE — Telephone Encounter (Signed)
 Daughter called to inform that pt's BP still elevated. Had to stop losartan due to hyperkalemia - continuing metoprolol 100mg  and added HCTZ 12.5mg  1 wk ago.     Systolic readings in the 150s. Diastolic normal/below 80  Denies headaches, dizziness, facial flushing.    BP was stable when she was taking the losartan.    Daughter aware EM on vacation for rest of the wk. She would prefer this is addressed sooner by on-call provider. JK - do you have any suggestions?

## 2024-01-30 NOTE — Telephone Encounter (Signed)
 Spoke with daughter - she is aware. Will take 2 of the 12.5mg  to finish the rx she currently has, then will pick up new rx. Will go for repeat labs next wk to check on electrolytes/kidney function. Will continue to monitor BP and check in with us  in a few days.

## 2024-02-03 MED ORDER — omeprazole (PriLOSEC) 20 mg DR capsule
20 | ORAL_CAPSULE | Freq: Every day | ORAL | 0 refills | 60.00000 days | Status: AC
Start: 2024-02-03 — End: ?

## 2024-02-03 NOTE — Telephone Encounter (Signed)
 Filled 10/30/2023 (90 days) per [facility]  CPE 01/16/24

## 2024-02-03 NOTE — Patient Instructions (Signed)
 Last filled january with 90 days  CPE 01/16/24  Per note:  Gastroesophageal reflux disease without esophagitis  Assessment & Plan:  Stable on Omeprazole.    RX sent

## 2024-02-04 MED ORDER — gabapentin (Neurontin) 300 mg capsule
300 | ORAL_CAPSULE | Freq: Two times a day (BID) | ORAL | 0 refills | 30.00000 days | Status: AC
Start: 2024-02-04 — End: 2024-05-04

## 2024-02-04 NOTE — Telephone Encounter (Signed)
 Noted.

## 2024-02-05 ENCOUNTER — Encounter: Payer: MEDICARE | Attending: Family Medicine | Primary: Family Medicine

## 2024-02-13 LAB — COMPREHENSIVE METABOLIC PANEL
ALT: 16 IU/L (ref 0–32)
AST: 19 IU/L (ref 0–40)
Albumin: 4.6 g/dL (ref 3.8–4.8)
Alk Phosphatase: 87 IU/L (ref 44–121)
Anion Gap: 16 mmol/L (ref 10.0–18.0)
BUN/Creat Ratio: 19 (ref 12–28)
BUN: 24 mg/dL (ref 8–27)
Bili Total: 0.2 mg/dL (ref 0.0–1.2)
Calcium: 9.6 mg/dL (ref 8.7–10.3)
Carbon Dioxide: 22 mmol/L (ref 20–29)
Chloride: 102 mmol/L (ref 96–106)
Creat: 1.25 mg/dL — ABNORMAL HIGH (ref 0.57–1.00)
Globulin Total: 2.3 g/dL (ref 1.5–4.5)
Glucose: 202 mg/dL — ABNORMAL HIGH (ref 70–99)
Potassium: 5.1 mmol/L (ref 3.5–5.2)
Protein Total: 6.9 g/dL (ref 6.0–8.5)
Sodium: 140 mmol/L (ref 134–144)
eGFR: 46 mL/min/{1.73_m2} — ABNORMAL LOW (ref >59)

## 2024-02-13 LAB — MAGNESIUM: Magnesium: 1.5 mg/dL — ABNORMAL LOW (ref 1.6–2.3)

## 2024-02-13 LAB — BASIC METABOLIC PANEL
Anion Gap: 16 mmol/L (ref 10.0–18.0)
BUN/Creat Ratio: 19 (ref 12–28)
BUN: 24 mg/dL (ref 8–27)
Calcium: 9.7 mg/dL (ref 8.7–10.3)
Carbon Dioxide: 21 mmol/L (ref 20–29)
Chloride: 101 mmol/L (ref 96–106)
Creat: 1.26 mg/dL — ABNORMAL HIGH (ref 0.57–1.00)
Glucose: 204 mg/dL — ABNORMAL HIGH (ref 70–99)
Potassium: 4.9 mmol/L (ref 3.5–5.2)
Sodium: 138 mmol/L (ref 134–144)
eGFR: 46 mL/min/{1.73_m2} — ABNORMAL LOW (ref >59)

## 2024-02-14 ENCOUNTER — Ambulatory Visit: Admit: 2024-02-14 | Discharge: 2024-02-14 | Payer: MEDICARE | Attending: Family Medicine | Primary: Family Medicine

## 2024-02-14 DIAGNOSIS — I10 Essential (primary) hypertension: Secondary | ICD-10-CM

## 2024-02-14 NOTE — Progress Notes (Signed)
 [facility]  [address]  [address]  [place] [address]  Dept: [phone]  Dept Fax: [phone]     Patient ID: [name] is a 73 y.o. female who presents for Follow-up (K+ improved - 02/12/24 resulted at 5.1. BP meds changed to HCTZ 25mg  and metoprolol 100mg  QD, BP readings fluctuating. Denies cardiac sx).    Subjective   73 year old female presents for follow up of hypertension. She was on Metoprolol and Losartan but due to high Potassium Losartan was discontinued on 01/17/24. Replaced with Hydrochlorothiazide 12.5 mg. The daughter called on 01/29/24 due to persistent high BP= 150-160's. HCTZ was increased to 25 mg. Her BP in the last week mostly 130's. Had 1 reading in the 150's and 160's. Denies headache, dizziness, chest pain and shortness of breath.     Review of Systems   Constitutional: Negative.    Respiratory: Negative.     Cardiovascular: Negative.    Gastrointestinal: Negative.    Genitourinary: Negative.    Neurological: Negative.       Problem List[1]  Current Outpatient Medications   Medication Instructions    albuterol 90 mcg/actuation inhaler 2 puffs, inhalation, Every 6 hours PRN    aspirin 81 mg EC tablet 1 tablet, Daily    atorvastatin (LIPITOR) 40 mg, oral, Nightly    cholecalciferol (Vitamin D-3) 25 MCG (1000 UT) capsule 1 capsule, Daily    clotrimazole (Lotrimin) 1 % cream APPLY TOPICALLY IF NEEDED IN THE MORNING AND AT BEDTIME (INTERTRIGO)    clotrimazole-betamethasone (Lotrisone) cream 2 times daily    escitalopram (LEXAPRO) 20 mg, oral, Daily    fluticasone (Flonase) 50 mcg/actuation nasal spray 2 sprays, Each Nostril, Daily, Shake gently. Before first use, prime pump. After use, clean tip and replace cap.    gabapentin (NEURONTIN) 300 mg, oral, 2 times daily    hydroCHLOROthiazide (HYDRODIURIL) 25 mg, oral, Daily    metFORMIN (GLUCOPHAGE) 1,000 mg, oral, 2 times daily with meals    metoprolol tartrate (LOPRESSOR) 100 mg, oral, 2 times daily    multivit with  minerals/lutein (MULTIVITAMIN 50 PLUS ORAL) 1 tablet, Daily RT    nystatin (Mycostatin) 100,000 unit/gram powder Topical, 2 times daily    omeprazole (PRILOSEC) 20 mg, oral, Daily, Do not crush or chew    pioglitazone (ACTOS) 30 mg, oral, Daily     Allergies[2]  Social History[3]    Objective   Visit Vitals  BP 136/64   Pulse 52   Wt 76.7 kg Comment: 169 lb   SpO2 98%   BMI 35.32 kg/m   OB Status Postmenopausal   BSA 1.77 m     Physical Exam  Vitals reviewed.   Constitutional:       Appearance: Normal appearance.   Cardiovascular:      Rate and Rhythm: Normal rate and regular rhythm.      Heart sounds: Normal heart sounds. No murmur heard.  Pulmonary:      Effort: No respiratory distress.      Breath sounds: Normal breath sounds.   Musculoskeletal:      Right lower leg: No edema.      Left lower leg: No edema.   Neurological:      General: No focal deficit present.      Mental Status: She is alert and oriented to person, place, and time.      Cranial Nerves: No cranial nerve deficit.         Assessment/Plan   [name] was  seen today for follow-up.  Essential hypertension   Assessment & Plan:  BP today is good. Continue Metoprolol and HCTZ 25 mg. Low salt and DASH diet. Follow up in 1 month.                                [1]   Patient Active Problem List  Diagnosis    Adjustment disorder with depressed mood     Benign paroxysmal positional vertigo    Closed bimalleolar fracture    Closed fracture of talus    Dysfunction of both eustachian tubes    GERD (gastroesophageal reflux disease)    Hypercholesterolemia     Essential hypertension     Knee buckling    Obesity (BMI 35.0-39.9 without comorbidity)    Plantar fasciitis of right foot    Pulmonary nodule    Thoracic back pain    Type 2 diabetes mellitus (Multi-HCC)    Type 2 diabetes mellitus with diabetic neuropathy, unspecified (Multi-HCC)    Stage 3b chronic kidney disease (CMS-HCC)    Anemia    Screening for colon cancer    Routine adult health maintenance     Hypothyroidism     Screening for osteoporosis    Medication monitoring encounter    Tinnitus, bilateral    Viral URI with cough    Hypnopompic hallucination    Encounter for immunization    History of hypothyroidism    History of anemia    Encounter for screening examination for other mental health and behavioral disorders    Moderate major depression (Multi-HCC)    Colonoscopy refused   [2]   Allergies  Allergen Reactions    Citalopram Hallucinations    Glimepiride Dizziness    Lisinopril Cough   [3]   Social History  Socioeconomic History    Marital status: Married     Spouse name: None    Number of children: None    Years of education: None    Highest education level: None   Occupational History    None   Tobacco Use    Smoking status: Never    Smokeless tobacco: Never   Vaping Use    Vaping status: Never Used   Substance and Sexual Activity    Alcohol use: Never    Drug use: Never    Sexual activity: Defer   Other Topics Concern    None   Social History Narrative    Social History: Sexual History  Had sex in the past 12 months (vaginal, oral, or anal)? No, Have you ever had an STD? No. Alcohol Smart Form  Did you have a drink containing alcohol in the past year? No, Points 0, Interpretation Negative. Tobacco Use  Status: never smoker, Patient counseled on the dangers of tobacco use: 09/13/2020. Seat belt: yes. no Alcohol. no Drug use. Diet/Nutrition: Three meals a day, Dairy, Fruits & Vegetables, Protein Foods. no Exercise. Marital Status: married, husband - Antonio. no Domestic violence. Children: two Daughters. Number of household members: 6, self, husband, daughter, son in law, grand daughter, grandson. Occupation: retired. no Travel outside [place] . Caffeine: coffee in the am. Home smoke detector use: yes. Pets: 2 cat, 3 dogs (chihuahuas). Sexual orientation: heterosexual. Sexually active: not currently.      Social Determinants of Health     Financial Resource Strain: Medium Risk (01/16/2024)    Overall Financial  Resource Strain (CARDIA)     Difficulty of Paying  Living Expenses: Somewhat hard   Food Insecurity: Unknown (01/16/2024)    Hunger Vital Sign     Worried About Running Out of Food in the Last Year: Never true     Ran Out of Food in the Last Year: Not on file   Transportation Needs: No Transportation Needs (01/16/2024)    PRAPARE - Therapist, art (Medical): No     Lack of Transportation (Non-Medical): No   Physical Activity: Inactive (01/16/2024)    Exercise Vital Sign     Days of Exercise per Week: 0 days     Minutes of Exercise per Session: 0 min   Stress: Stress Concern Present (01/16/2024)    [employer] of Occupational Health - Occupational Stress Questionnaire     Feeling of Stress : Rather much   Social Connections: Unknown (01/16/2024)    Social Connection and Isolation Panel [NHANES]     Frequency of Communication with Friends and Family: Three times a week     Frequency of Social Gatherings with Friends and Family: Not on file     Attends Religious Services: Not on file     Active Member of Clubs or Organizations: No     Attends Banker Meetings: Not on file     Marital Status: Not on file   Intimate Partner Violence: Not At Risk (12/03/2022)    Humiliation, Afraid, Rape, and Kick questionnaire     Fear of Current or Ex-Partner: No     Emotionally Abused: No     Physically Abused: No     Sexually Abused: No   Housing Stability: Unknown (01/16/2024)    Housing Stability Vital Sign     Unable to Pay for Housing in the Last Year: Not on file     Number of Times Moved in the Last Year: Not on file     Homeless in the Last Year: No

## 2024-02-16 MED ORDER — hydroCHLOROthiazide (HYDRODiuril) 25 mg tablet
25 | ORAL_TABLET | Freq: Every day | ORAL | 0 refills | 90.00000 days | Status: AC
Start: 2024-02-16 — End: 2024-05-16

## 2024-02-16 NOTE — Assessment & Plan Note (Signed)
 BP today is good. Continue Metoprolol and HCTZ 25 mg. Low salt and DASH diet. Follow up in 1 month.

## 2024-02-18 NOTE — Telephone Encounter (Signed)
 Last seen 02/14/24  Upcoming vis 03/17/24  Last filled 11/18/23, QTY 180, 0 refills

## 2024-02-19 ENCOUNTER — Inpatient Hospital Stay: Admit: 2024-02-19 | Payer: MEDICARE | Primary: Family Medicine

## 2024-02-19 DIAGNOSIS — Z1231 Encounter for screening mammogram for malignant neoplasm of breast: Secondary | ICD-10-CM

## 2024-02-26 LAB — LAB COLOGUARD COLON CANCER SCREEN

## 2024-03-16 NOTE — Telephone Encounter (Signed)
 Both Last filled pmp lexapro 12/20/23      Last seen 02/14/24  Next OV 03/17/24

## 2024-03-16 NOTE — Telephone Encounter (Signed)
 Last filled 09/18/23  Last seen 02/14/24

## 2024-03-17 ENCOUNTER — Encounter: Payer: MEDICARE | Attending: Family Medicine | Primary: Family Medicine

## 2024-04-09 MED ORDER — atorvastatin (Lipitor) 40 mg tablet
40 | ORAL_TABLET | Freq: Every evening | ORAL | 1 refills | 90.00000 days | Status: AC
Start: 2024-04-09 — End: ?

## 2024-04-09 NOTE — Telephone Encounter (Signed)
 Last Refill:  10/07/23      LOV-02/14/24    LCPE-01/16/24    NOV-07/16/24

## 2024-05-04 MED ORDER — omeprazole (PriLOSEC) 20 mg DR capsule
20 | ORAL_CAPSULE | Freq: Every day | ORAL | 0 refills | 60.00000 days | Status: AC
Start: 2024-05-04 — End: ?

## 2024-05-04 NOTE — Telephone Encounter (Signed)
 Last filled 02/03/24 #90.   Last OV 02/14/24, Next OV, 07/16/24

## 2024-05-09 NOTE — Telephone Encounter (Signed)
 Last filled 5/67/25 #90    Last OV 02/14/24  Next OV- 07/16/24

## 2024-05-11 MED ORDER — gabapentin (Neurontin) 300 mg capsule
300 | ORAL_CAPSULE | Freq: Two times a day (BID) | ORAL | 0 refills | 30.00000 days | Status: AC
Start: 2024-05-11 — End: 2024-08-09

## 2024-05-19 NOTE — Telephone Encounter (Signed)
 Last filled 02/18/24  Cpe 01/16/24  Next OV 07/16/24

## 2024-06-09 MED ORDER — hydroCHLOROthiazide (HYDRODiuril) 25 mg tablet
25 | ORAL_TABLET | Freq: Every day | ORAL | 0 refills | 90.00000 days | Status: AC
Start: 2024-06-09 — End: 2024-09-07

## 2024-06-09 NOTE — Telephone Encounter (Signed)
 CPE: 01/16/24  NV: 07/16/24  Last filled 03/05/24 for 90 days

## 2024-06-20 NOTE — Telephone Encounter (Signed)
 All last filled 03/18/24  NOV 07/16/24  LOV 02/14/24  AWV 01/16/24

## 2024-06-22 NOTE — Telephone Encounter (Signed)
 Duplicate

## 2024-07-16 ENCOUNTER — Ambulatory Visit: Admit: 2024-07-16 | Discharge: 2024-07-16 | Payer: MEDICARE | Attending: Family Medicine | Primary: Family Medicine

## 2024-07-16 NOTE — Assessment & Plan Note (Signed)
 Will check urine. If unrevealing she would like to try medication.

## 2024-07-16 NOTE — Assessment & Plan Note (Signed)
 Hemoglobin A1C is at goal. Continue current medications. Diabetic diet.

## 2024-07-16 NOTE — Assessment & Plan Note (Signed)
 Given her risks that includes diabetes and hypertension with abnormal EKG I will order stress test to r/o cardiac cause.

## 2024-07-16 NOTE — Assessment & Plan Note (Signed)
 BP in the low side and HR is 48. I will decrease Metoprolol  to 50 mg BID. Continue HCTZ. Follow up in 4 weeks.

## 2024-07-16 NOTE — Progress Notes (Signed)
 [facility]  [address]  [address]  [place] [address]  Dept: [phone]  Dept Fax: [phone]     Patient ID: [name] is a 73 y.o. female who presents for Follow-up (Waking during night to use restroom. Urinary frequency with urge incont. ).    Subjective  73 year old female presents for follow up of hypertension. She is on Metoprolol  100 mg BID and HCTZ 25 mg daily. She is adherent to low salt diet. She reports of intermittent left sided chest pain going on for about 6 months. It is pressure pain lasting for few minutes. Non radiating. Not related to activities. Had EKG done in April which showed LVH with strain.     Type 2 DM  Hemoglobin A1C- 6.6% (01/16/24)  Urine microalbumin 24.5 (01/16/24)  Eye exam- 08/22/23    Urge incontinece for a month. Associated with frequency. Sometimes with accidents.  Mostly upon waking up. Denies dysuria.     Review of Systems   Constitutional: Negative.    Respiratory: Negative.     Cardiovascular:  Positive for chest pain. Negative for palpitations, orthopnea, claudication, leg swelling and PND.   Gastrointestinal: Negative.    Genitourinary:  Positive for frequency and urgency. Negative for dysuria and hematuria.   Neurological: Negative.       Problem List[1]  Current Outpatient Medications   Medication Instructions    albuterol  90 mcg/actuation inhaler 2 puffs, inhalation, Every 6 hours PRN    aspirin 81 mg EC tablet 1 tablet, Daily    atorvastatin  (LIPITOR) 40 mg, oral, Nightly    cholecalciferol (Vitamin D-3) 25 MCG (1000 UT) capsule 1 capsule, Daily    clotrimazole  (Lotrimin ) 1 % cream APPLY TOPICALLY IF NEEDED IN THE MORNING AND AT BEDTIME (INTERTRIGO)    clotrimazole -betamethasone  (Lotrisone ) cream 2 times daily    escitalopram  (LEXAPRO ) 20 mg, oral, Daily    fluticasone  (Flonase ) 50 mcg/actuation nasal spray 2 sprays, Each Nostril, Daily, Shake gently. Before first use, prime pump. After use, clean tip and replace cap.    gabapentin  (NEURONTIN ) 300  mg, oral, 2 times daily    hydroCHLOROthiazide  (HYDRODIURIL ) 25 mg, oral, Daily    metFORMIN  (GLUCOPHAGE ) 1,000 mg, oral, 2 times daily with meals    metoprolol  tartrate (LOPRESSOR ) 100 mg, oral, 2 times daily    multivit with minerals/lutein (MULTIVITAMIN 50 PLUS ORAL) 1 tablet, Daily RT    nystatin  (Mycostatin ) 100,000 unit/gram powder Topical, 2 times daily    omeprazole  (PRILOSEC ) 20 mg, oral, Daily, Do not crush or chew    pioglitazone  (ACTOS ) 30 mg, oral, Daily     Allergies[2]  Social History[3]    Objective  Visit Vitals  BP 116/64 (BP Location: Left arm, Patient Position: Sitting)   Pulse (!) 48   Wt 77 kg   SpO2 98%   BMI 35.49 kg/m   OB Status Postmenopausal   BSA 1.77 m     Physical Exam  Vitals reviewed.   Constitutional:       Appearance: Normal appearance.   Cardiovascular:      Rate and Rhythm: Normal rate and regular rhythm.      Heart sounds: Normal heart sounds. No murmur heard.  Pulmonary:      Effort: No respiratory distress.      Breath sounds: Normal breath sounds.   Abdominal:      General: There is no distension.      Palpations: Abdomen is soft.      Tenderness:  There is no abdominal tenderness.   Musculoskeletal:      Right lower leg: No edema.      Left lower leg: No edema.   Neurological:      General: No focal deficit present.      Mental Status: She is alert and oriented to person, place, and time.       Assessment / Plan  [name] was seen today for follow-up.  Essential hypertension  Assessment & Plan:  BP in the low side and HR is 48. I will decrease Metoprolol  to 50 mg BID. Continue HCTZ. Follow up in 4 weeks.  Type 2 diabetes mellitus with diabetic neuropathy, without long-term current use of insulin  Assessment & Plan:  Hemoglobin A1C is at goal. Continue current medications. Diabetic diet.  Urge incontinence  Assessment & Plan:  Will check urine. If unrevealing she would like to try medication.   Orders:  -     Urinalysis reflex to culture  -     Urine culture  Chest pain,  unspecified type  Assessment & Plan:  Given her risks that includes diabetes and hypertension with abnormal EKG I will order stress test to r/o cardiac cause.   Orders:  -     Stress test with myocardial perfusion; Future  Adjustment disorder with depressed mood  Assessment & Plan:  Stable. On Lexapro   Other orders  -     Flu vaccine, trivalent, adjuvanted, preservative free      Patient Health Questionnaire-9 Score: 7   Interpretation: Positive screening.     Follow-up & Interventions:   - Maintain Current Treatment Plan for Depression.             [1]   Patient Active Problem List  Diagnosis    Adjustment disorder with depressed mood    Benign paroxysmal positional vertigo    Closed bimalleolar fracture    Closed fracture of talus    Dysfunction of both eustachian tubes    GERD (gastroesophageal reflux disease)    Hypercholesterolemia    Essential hypertension    Knee buckling    Obesity (BMI 35.0-39.9 without comorbidity)    Plantar fasciitis of right foot    Pulmonary nodule    Thoracic back pain    Type 2 diabetes mellitus    Type 2 diabetes mellitus with diabetic neuropathy, unspecified    Stage 3b chronic kidney disease    Anemia    Screening for colon cancer    Routine adult health maintenance    Hypothyroidism    Screening for osteoporosis    Medication monitoring encounter    Tinnitus, bilateral    Viral URI with cough    Hypnopompic hallucination    Encounter for immunization    History of hypothyroidism    History of anemia    Encounter for screening examination for other mental health and behavioral disorders    Moderate major depression    Colonoscopy refused    Urge incontinence    Chest pain   [2]   Allergies  Allergen Reactions    Citalopram Hallucinations    Glimepiride Dizziness    Lisinopril Cough   [3]   Social History  Socioeconomic History    Marital status: Married     Spouse name: None    Number of children: None    Years of education: None    Highest education level: None   Occupational  History    None   Tobacco Use  Smoking status: Never    Smokeless tobacco: Never   Vaping Use    Vaping status: Never Used   Substance and Sexual Activity    Alcohol use: Never    Drug use: Never    Sexual activity: Defer   Other Topics Concern    None   Social History Narrative    Social History: Sexual History  Had sex in the past 12 months (vaginal, oral, or anal)? No, Have you ever had an STD? No. Alcohol Smart Form  Did you have a drink containing alcohol in the past year? No, Points 0, Interpretation Negative. Tobacco Use  Status: never smoker, Patient counseled on the dangers of tobacco use: 09/13/2020. Seat belt: yes. no Alcohol. no Drug use. Diet/Nutrition: Three meals a day, Dairy, Fruits & Vegetables, Protein Foods. no Exercise. Marital Status: married, husband - Antonio. no Domestic violence. Children: two Daughters. Number of household members: 6, self, husband, daughter, son in law, grand daughter, grandson. Occupation: retired. no Travel outside [place] . Caffeine: coffee in the am. Home smoke detector use: yes. Pets: 2 cat, 3 dogs (chihuahuas). Sexual orientation: heterosexual. Sexually active: not currently.      Social Determinants of Health     Financial Resource Strain: Low Risk (07/16/2024)    Overall Financial Resource Strain (CARDIA)     Difficulty of Paying Living Expenses: Not hard at all   Food Insecurity: Unknown (07/16/2024)    Hunger Vital Sign     Worried About Running Out of Food in the Last Year: Never true     Ran Out of Food in the Last Year: Not on file   Transportation Needs: No Transportation Needs (07/16/2024)    PRAPARE - Therapist, art (Medical): No     Lack of Transportation (Non-Medical): No   Physical Activity: Inactive (01/16/2024)    Exercise Vital Sign     Days of Exercise per Week: 0 days     Minutes of Exercise per Session: 0 min   Stress: Stress Concern Present (01/16/2024)    [employer] of Occupational Health - Occupational Stress  Questionnaire     Feeling of Stress : Rather much   Social Connections: Unknown (01/16/2024)    Social Connection and Isolation Panel     Frequency of Communication with Friends and Family: Three times a week     Frequency of Social Gatherings with Friends and Family: Not on file     Attends Religious Services: Not on file     Active Member of Clubs or Organizations: No     Attends Banker Meetings: Not on file     Marital Status: Not on file   Intimate Partner Violence: Not At Risk (12/03/2022)    Humiliation, Afraid, Rape, and Kick questionnaire     Fear of Current or Ex-Partner: No     Emotionally Abused: No     Physically Abused: No     Sexually Abused: No   Housing Stability: Unknown (07/16/2024)    Housing Stability Vital Sign     Unable to Pay for Housing in the Last Year: Not on file     Number of Times Moved in the Last Year: Not on file     Homeless in the Last Year: No

## 2024-07-16 NOTE — Assessment & Plan Note (Signed)
Stable. On Lexapro

## 2024-07-17 LAB — URINALYSIS REFLEX TO CULTURE
Bilirubin Ur: NEGATIVE
Glucose Ur: NEGATIVE
Nitrite Ur: NEGATIVE
Occult Blood Ur: NEGATIVE
Specific Gravity Ur: 1.022 (ref 1.005–1.030)
Urobilinogen,Semi-Qn Ur: 0.2 mg/dL (ref 0.2–1.0)
pH Ur: 5.5 (ref 5.0–7.5)

## 2024-07-17 LAB — URINE MICROSCOPIC (REFLEX ONLY)
Bacteria Ur: NONE SEEN
Casts Ur: NONE SEEN /LPF

## 2024-07-18 LAB — URINE CULTURE

## 2024-08-05 MED ORDER — OMEPRAZOLE 20 MG CAPSULE,DELAYED RELEASE
20 | ORAL_CAPSULE | Freq: Every day | ORAL | 0 refills | 90.00000 days | Status: DC
Start: 2024-08-05 — End: 2024-11-02

## 2024-08-05 NOTE — Telephone Encounter (Signed)
 Last appointment: 07/16/24  Last Annual Exam: 01/16/24  Upcoming: 08/20/24

## 2024-08-07 NOTE — Telephone Encounter (Signed)
 Last filled 05/11/24  Last seen 07/16/24  Next ov 08/20/24

## 2024-08-08 MED ORDER — GABAPENTIN 300 MG CAPSULE
300 | ORAL_CAPSULE | Freq: Two times a day (BID) | ORAL | 0 refills | 30.00000 days | Status: AC
Start: 2024-08-08 — End: 2024-11-06

## 2024-08-20 ENCOUNTER — Encounter: Payer: MEDICARE | Attending: Family Medicine | Primary: Family Medicine

## 2024-08-20 NOTE — Telephone Encounter (Signed)
 Last filled 05/19/24  Cpe 01/16/24

## 2024-09-03 NOTE — Telephone Encounter (Signed)
 Last filled 06/09/24  Cpe 01/16/24

## 2024-09-10 MED ORDER — OSELTAMIVIR 30 MG CAPSULE
30 | ORAL_CAPSULE | Freq: Every day | ORAL | 0 refills | 5.00000 days | Status: AC
Start: 2024-09-10 — End: 2024-09-17

## 2024-09-10 NOTE — Progress Notes (Signed)
 Patient's daughter called. Patient with positive flu exposure, no known sx. Renal dosing of tamiflu  sent for one week. Warning signs reviewed. Advised if sx develop to contact the office.

## 2024-09-21 NOTE — Telephone Encounter (Signed)
 All Last filled 06/21/24  Cpe 01/16/24

## 2024-10-03 NOTE — Telephone Encounter (Signed)
 Refill request for Atorvastatin   Last filled 07/10/24 for 90 days.    Last OV 07/16/24

## 2024-10-15 ENCOUNTER — Ambulatory Visit: Payer: MEDICARE | Primary: Family Medicine

## 2024-10-31 NOTE — Telephone Encounter (Signed)
 Duplicate

## 2024-10-31 NOTE — Telephone Encounter (Signed)
 Last seen on 07/16/2024    Last filled on 08/05/2024, qty of 90, 0 refills
